# Patient Record
Sex: Female | Born: 1950
Health system: Southern US, Community
[De-identification: ages and names within clinical notes are randomized; demographics above are authoritative.]

## PROBLEM LIST (undated history)

## (undated) DIAGNOSIS — C50912 Malignant neoplasm of unspecified site of left female breast: Secondary | ICD-10-CM

## (undated) DIAGNOSIS — D649 Anemia, unspecified: Secondary | ICD-10-CM

## (undated) DIAGNOSIS — H9190 Unspecified hearing loss, unspecified ear: Secondary | ICD-10-CM

## (undated) DIAGNOSIS — N200 Calculus of kidney: Secondary | ICD-10-CM

## (undated) DIAGNOSIS — H409 Unspecified glaucoma: Secondary | ICD-10-CM

## (undated) DIAGNOSIS — J189 Pneumonia, unspecified organism: Secondary | ICD-10-CM

## (undated) DIAGNOSIS — M1990 Unspecified osteoarthritis, unspecified site: Secondary | ICD-10-CM

## (undated) DIAGNOSIS — K5792 Diverticulitis of intestine, part unspecified, without perforation or abscess without bleeding: Secondary | ICD-10-CM

## (undated) DIAGNOSIS — B029 Zoster without complications: Secondary | ICD-10-CM

## (undated) DIAGNOSIS — I890 Lymphedema, not elsewhere classified: Secondary | ICD-10-CM

## (undated) DIAGNOSIS — E785 Hyperlipidemia, unspecified: Secondary | ICD-10-CM

## (undated) DIAGNOSIS — C189 Malignant neoplasm of colon, unspecified: Secondary | ICD-10-CM

## (undated) DIAGNOSIS — G709 Myoneural disorder, unspecified: Secondary | ICD-10-CM

## (undated) DIAGNOSIS — R42 Dizziness and giddiness: Secondary | ICD-10-CM

## (undated) HISTORY — DX: Unspecified hearing loss, unspecified ear: H91.90

## (undated) HISTORY — PX: COLON RESECTION: SHX5231

## (undated) HISTORY — DX: Unspecified osteoarthritis, unspecified site: M19.90

## (undated) HISTORY — DX: Pneumonia, unspecified organism: J18.9

## (undated) HISTORY — DX: Calculus of kidney: N20.0

## (undated) HISTORY — DX: Anemia, unspecified: D64.9

## (undated) HISTORY — DX: Zoster without complications: B02.9

## (undated) HISTORY — DX: Unspecified glaucoma: H40.9

## (undated) HISTORY — DX: Hyperlipidemia, unspecified: E78.5

## (undated) HISTORY — PX: MASTECTOMY: SHX3

---
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
## 1971-01-21 HISTORY — PX: ABDOMINAL HYSTERECTOMY: SHX81

## 1971-01-21 HISTORY — PX: OTHER SURGICAL HISTORY: SHX169

## 2000-01-21 DIAGNOSIS — C50912 Malignant neoplasm of unspecified site of left female breast: Secondary | ICD-10-CM

## 2000-01-21 HISTORY — DX: Malignant neoplasm of unspecified site of left female breast: C50.912

## 2002-01-20 HISTORY — PX: COLONOSCOPY: SHX174

## 2003-12-19 ENCOUNTER — Ambulatory Visit: Payer: Self-pay | Admitting: Oncology

## 2004-01-11 ENCOUNTER — Ambulatory Visit: Payer: Self-pay | Admitting: Oncology

## 2004-01-21 ENCOUNTER — Ambulatory Visit: Payer: Self-pay | Admitting: Oncology

## 2004-03-22 ENCOUNTER — Ambulatory Visit: Payer: Self-pay | Admitting: Specialist

## 2004-04-29 ENCOUNTER — Ambulatory Visit: Payer: Self-pay | Admitting: General Surgery

## 2004-04-30 ENCOUNTER — Ambulatory Visit: Payer: Self-pay | Admitting: Oncology

## 2004-05-20 ENCOUNTER — Ambulatory Visit: Payer: Self-pay | Admitting: Oncology

## 2004-06-20 ENCOUNTER — Ambulatory Visit: Payer: Self-pay | Admitting: Oncology

## 2004-07-20 ENCOUNTER — Ambulatory Visit: Payer: Self-pay | Admitting: Oncology

## 2004-08-26 ENCOUNTER — Ambulatory Visit: Payer: Self-pay | Admitting: Oncology

## 2004-09-13 ENCOUNTER — Ambulatory Visit: Payer: Self-pay | Admitting: Oncology

## 2004-09-20 ENCOUNTER — Ambulatory Visit: Payer: Self-pay | Admitting: Oncology

## 2004-12-16 ENCOUNTER — Ambulatory Visit: Payer: Self-pay | Admitting: Oncology

## 2004-12-20 ENCOUNTER — Ambulatory Visit: Payer: Self-pay | Admitting: Oncology

## 2005-01-21 ENCOUNTER — Ambulatory Visit: Payer: Self-pay | Admitting: Oncology

## 2005-01-29 DIAGNOSIS — J309 Allergic rhinitis, unspecified: Secondary | ICD-10-CM | POA: Insufficient documentation

## 2005-02-20 ENCOUNTER — Ambulatory Visit: Payer: Self-pay | Admitting: Oncology

## 2005-04-14 ENCOUNTER — Ambulatory Visit: Payer: Self-pay | Admitting: Oncology

## 2005-04-20 ENCOUNTER — Ambulatory Visit: Payer: Self-pay | Admitting: Oncology

## 2005-05-27 ENCOUNTER — Ambulatory Visit: Payer: Self-pay | Admitting: Oncology

## 2005-06-16 ENCOUNTER — Ambulatory Visit: Payer: Self-pay | Admitting: General Surgery

## 2005-06-20 ENCOUNTER — Ambulatory Visit: Payer: Self-pay | Admitting: Oncology

## 2005-08-12 ENCOUNTER — Ambulatory Visit: Payer: Self-pay | Admitting: Oncology

## 2005-08-20 ENCOUNTER — Ambulatory Visit: Payer: Self-pay | Admitting: Oncology

## 2005-11-11 ENCOUNTER — Ambulatory Visit: Payer: Self-pay | Admitting: Oncology

## 2005-11-20 ENCOUNTER — Ambulatory Visit: Payer: Self-pay | Admitting: Oncology

## 2005-11-25 ENCOUNTER — Emergency Department: Payer: Self-pay | Admitting: Emergency Medicine

## 2006-02-09 ENCOUNTER — Ambulatory Visit: Payer: Self-pay | Admitting: Oncology

## 2006-02-20 ENCOUNTER — Ambulatory Visit: Payer: Self-pay | Admitting: Oncology

## 2006-03-02 ENCOUNTER — Encounter: Payer: Self-pay | Admitting: Unknown Physician Specialty

## 2006-03-21 ENCOUNTER — Encounter: Payer: Self-pay | Admitting: Unknown Physician Specialty

## 2006-04-21 ENCOUNTER — Encounter: Payer: Self-pay | Admitting: Unknown Physician Specialty

## 2006-04-27 ENCOUNTER — Emergency Department: Payer: Self-pay | Admitting: General Practice

## 2006-05-21 ENCOUNTER — Ambulatory Visit: Payer: Self-pay | Admitting: Oncology

## 2006-05-27 ENCOUNTER — Ambulatory Visit: Payer: Self-pay | Admitting: Oncology

## 2006-06-21 ENCOUNTER — Ambulatory Visit: Payer: Self-pay | Admitting: Oncology

## 2006-07-06 ENCOUNTER — Ambulatory Visit: Payer: Self-pay | Admitting: General Surgery

## 2006-09-21 ENCOUNTER — Ambulatory Visit: Payer: Self-pay | Admitting: Oncology

## 2006-10-05 ENCOUNTER — Ambulatory Visit: Payer: Self-pay | Admitting: Oncology

## 2006-10-21 ENCOUNTER — Ambulatory Visit: Payer: Self-pay | Admitting: Oncology

## 2006-12-09 ENCOUNTER — Ambulatory Visit: Payer: Self-pay | Admitting: Physician Assistant

## 2006-12-21 ENCOUNTER — Ambulatory Visit: Payer: Self-pay | Admitting: Oncology

## 2006-12-30 DIAGNOSIS — J209 Acute bronchitis, unspecified: Secondary | ICD-10-CM | POA: Insufficient documentation

## 2007-01-01 ENCOUNTER — Ambulatory Visit: Payer: Self-pay | Admitting: Family Medicine

## 2007-01-11 ENCOUNTER — Ambulatory Visit: Payer: Self-pay | Admitting: Oncology

## 2007-01-21 ENCOUNTER — Ambulatory Visit: Payer: Self-pay | Admitting: Oncology

## 2007-03-03 ENCOUNTER — Ambulatory Visit: Payer: Self-pay | Admitting: Family Medicine

## 2007-03-05 ENCOUNTER — Ambulatory Visit: Payer: Self-pay | Admitting: Oncology

## 2007-03-08 ENCOUNTER — Ambulatory Visit: Payer: Self-pay | Admitting: Oncology

## 2007-04-21 ENCOUNTER — Ambulatory Visit: Payer: Self-pay | Admitting: Oncology

## 2007-05-03 ENCOUNTER — Ambulatory Visit: Payer: Self-pay | Admitting: Oncology

## 2007-05-21 ENCOUNTER — Ambulatory Visit: Payer: Self-pay | Admitting: Oncology

## 2007-07-12 ENCOUNTER — Ambulatory Visit: Payer: Self-pay | Admitting: Gastroenterology

## 2007-07-14 ENCOUNTER — Ambulatory Visit: Payer: Self-pay | Admitting: Oncology

## 2007-07-19 ENCOUNTER — Ambulatory Visit: Payer: Self-pay | Admitting: Oncology

## 2007-07-21 ENCOUNTER — Ambulatory Visit: Payer: Self-pay | Admitting: Oncology

## 2007-10-21 ENCOUNTER — Ambulatory Visit: Payer: Self-pay | Admitting: Oncology

## 2007-11-21 ENCOUNTER — Ambulatory Visit: Payer: Self-pay | Admitting: Oncology

## 2007-11-25 ENCOUNTER — Ambulatory Visit: Payer: Self-pay | Admitting: Oncology

## 2007-12-21 ENCOUNTER — Ambulatory Visit: Payer: Self-pay | Admitting: Oncology

## 2008-02-22 ENCOUNTER — Ambulatory Visit: Payer: Self-pay | Admitting: General Surgery

## 2008-02-29 ENCOUNTER — Ambulatory Visit: Payer: Self-pay | Admitting: General Surgery

## 2008-05-20 ENCOUNTER — Ambulatory Visit: Payer: Self-pay | Admitting: Oncology

## 2008-05-25 ENCOUNTER — Ambulatory Visit: Payer: Self-pay | Admitting: Oncology

## 2008-06-20 ENCOUNTER — Ambulatory Visit: Payer: Self-pay | Admitting: Oncology

## 2008-08-16 ENCOUNTER — Ambulatory Visit: Payer: Self-pay | Admitting: Family Medicine

## 2009-05-21 ENCOUNTER — Ambulatory Visit: Payer: Self-pay | Admitting: Oncology

## 2009-05-30 ENCOUNTER — Ambulatory Visit: Payer: Self-pay | Admitting: Oncology

## 2009-06-20 ENCOUNTER — Ambulatory Visit: Payer: Self-pay | Admitting: Oncology

## 2009-08-22 ENCOUNTER — Ambulatory Visit: Payer: Self-pay | Admitting: Family Medicine

## 2009-08-28 ENCOUNTER — Ambulatory Visit: Payer: Self-pay | Admitting: Family Medicine

## 2010-04-13 ENCOUNTER — Emergency Department: Payer: Self-pay | Admitting: Emergency Medicine

## 2010-06-06 ENCOUNTER — Ambulatory Visit: Payer: Self-pay | Admitting: Oncology

## 2010-06-21 ENCOUNTER — Ambulatory Visit: Payer: Self-pay | Admitting: Oncology

## 2010-07-08 ENCOUNTER — Ambulatory Visit: Payer: Self-pay | Admitting: Gastroenterology

## 2010-08-30 ENCOUNTER — Ambulatory Visit: Payer: Self-pay | Admitting: Family Medicine

## 2011-02-04 ENCOUNTER — Ambulatory Visit: Payer: Self-pay | Admitting: Oncology

## 2011-02-04 LAB — CBC CANCER CENTER
Basophil %: 0.7 %
Eosinophil #: 0 x10 3/mm (ref 0.0–0.7)
HCT: 38.4 % (ref 35.0–47.0)
HGB: 13.2 g/dL (ref 12.0–16.0)
Lymphocyte #: 1.2 x10 3/mm (ref 1.0–3.6)
Lymphocyte %: 37.7 %
MCHC: 34.4 g/dL (ref 32.0–36.0)
Monocyte %: 6.2 %
Neutrophil #: 1.8 x10 3/mm (ref 1.4–6.5)
Neutrophil %: 54.2 %
RDW: 13.3 % (ref 11.5–14.5)
WBC: 3.3 x10 3/mm — ABNORMAL LOW (ref 3.6–11.0)

## 2011-02-04 LAB — COMPREHENSIVE METABOLIC PANEL
Alkaline Phosphatase: 104 U/L (ref 50–136)
Anion Gap: 5 — ABNORMAL LOW (ref 7–16)
Calcium, Total: 8.6 mg/dL (ref 8.5–10.1)
Chloride: 105 mmol/L (ref 98–107)
Co2: 32 mmol/L (ref 21–32)
EGFR (Non-African Amer.): >60
Osmolality: 281 (ref 275–301)
Potassium: 3.7 mmol/L (ref 3.5–5.1)
SGOT(AST): 23 U/L (ref 15–37)
Sodium: 142 mmol/L (ref 136–145)

## 2011-02-21 ENCOUNTER — Ambulatory Visit: Payer: Self-pay | Admitting: Oncology

## 2011-03-11 ENCOUNTER — Encounter: Payer: Self-pay | Admitting: Oncology

## 2011-03-21 ENCOUNTER — Ambulatory Visit: Payer: Self-pay | Admitting: Oncology

## 2011-03-21 ENCOUNTER — Encounter: Payer: Self-pay | Admitting: Oncology

## 2011-04-21 ENCOUNTER — Encounter: Payer: Self-pay | Admitting: Oncology

## 2011-05-21 ENCOUNTER — Encounter: Payer: Self-pay | Admitting: Oncology

## 2011-06-21 ENCOUNTER — Encounter: Payer: Self-pay | Admitting: Oncology

## 2011-11-07 ENCOUNTER — Ambulatory Visit: Payer: Self-pay | Admitting: Family Medicine

## 2012-03-20 ENCOUNTER — Ambulatory Visit: Payer: Self-pay | Admitting: Oncology

## 2012-03-31 ENCOUNTER — Encounter: Payer: Self-pay | Admitting: *Deleted

## 2012-03-31 LAB — COMPREHENSIVE METABOLIC PANEL
Albumin: 3 g/dL — ABNORMAL LOW (ref 3.4–5.0)
Alkaline Phosphatase: 101 U/L (ref 50–136)
Anion Gap: 8 (ref 7–16)
Calcium, Total: 8 mg/dL — ABNORMAL LOW (ref 8.5–10.1)
Chloride: 105 mmol/L (ref 98–107)
Co2: 29 mmol/L (ref 21–32)
Creatinine: 0.99 mg/dL (ref 0.60–1.30)
EGFR (African American): >60
EGFR (Non-African Amer.): >60
Glucose: 119 mg/dL — ABNORMAL HIGH (ref 65–99)
Sodium: 142 mmol/L (ref 136–145)

## 2012-03-31 LAB — CBC CANCER CENTER
Basophil #: 0 x10 3/mm (ref 0.0–0.1)
Basophil %: 0.7 %
Eosinophil %: 1.6 %
Lymphocyte #: 1 x10 3/mm (ref 1.0–3.6)
MCH: 27.9 pg (ref 26.0–34.0)
MCHC: 32 g/dL (ref 32.0–36.0)
Monocyte %: 7.6 %
Neutrophil #: 1.7 x10 3/mm (ref 1.4–6.5)
Platelet: 204 x10 3/mm (ref 150–440)
RBC: 3.35 10*6/uL — ABNORMAL LOW (ref 3.80–5.20)

## 2012-04-01 LAB — CANCER ANTIGEN 27.29: CA 27.29: 35.3 U/mL (ref 0.0–38.6)

## 2012-04-06 ENCOUNTER — Ambulatory Visit (INDEPENDENT_AMBULATORY_CARE_PROVIDER_SITE_OTHER): Payer: BC Managed Care – PPO | Admitting: General Surgery

## 2012-04-06 ENCOUNTER — Encounter: Payer: Self-pay | Admitting: General Surgery

## 2012-04-06 VITALS — BP 106/58 | HR 78 | Resp 12 | Ht 62.0 in | Wt 141.0 lb

## 2012-04-06 DIAGNOSIS — B029 Zoster without complications: Secondary | ICD-10-CM | POA: Insufficient documentation

## 2012-04-06 DIAGNOSIS — R109 Unspecified abdominal pain: Secondary | ICD-10-CM

## 2012-04-06 DIAGNOSIS — K4090 Unilateral inguinal hernia, without obstruction or gangrene, not specified as recurrent: Secondary | ICD-10-CM

## 2012-04-06 NOTE — Progress Notes (Signed)
Subjective:     Patient ID: Amy Bird, female   DOB: 1951-01-15, 62 y.o.   MRN: [id number]  HPI Patient presents with a 1 year history of abdominal pain. Patient states it's located in the right groin that comes and goes and ranges in severity. She states that certain things she drinks tends to flare up the pain. She has had a previous problem with kidney stones in which the pain is similar and is located in the same area.  The patient reports a one year history of discomfort in the right groin above the inguinal ligament.this is worse after she drinks coffee or orange juice, and has been associated with discomfort prior to voiding. She reports that she's had several urinary tract infections treated with antibiotics, but her last urine sample showed only inflammation. She reports no difficulty with bowel function. By report, a colonoscopy was completed last year.  She is not experiencing any postprandial pain or weight loss.  The patient underwent hysterectomy, right oophorectomy and appendectomy in the 1970s.   Review of Systems  Constitutional: Negative.   Cardiovascular: Negative.   Gastrointestinal: Negative.   Genitourinary: Negative.   Musculoskeletal: Negative.        Objective:   Physical Exam  Constitutional: She appears well-developed and well-nourished.  Neck: Normal range of motion. Neck supple.  Cardiovascular: Normal rate, regular rhythm and normal heart sounds.   Pulmonary/Chest: Effort normal and breath sounds normal.  Abdominal: Soft. Bowel sounds are normal. There is tenderness. A hernia is present. Hernia confirmed positive in the right inguinal area.  There is a slight bulge at the right internal inguinal ring with associated tenderness. The left side is intact.     Assessment:     Abdominal pain, possibly related to a right inguinal hernia.  Pelvic ultrasound completed in October 2013 showed no discernible abnormalities.     Plan:     The patient's primary  care physician will be contacted. The patient may benefit from a CT to assess for secondary sources of her pain prior to surgical intervention. Her symptoms are modest (4/10) and with a very slight bulge noted on clinical exam she may elect to defer elective intervention.

## 2012-04-07 ENCOUNTER — Telehealth: Payer: Self-pay | Admitting: *Deleted

## 2012-04-07 NOTE — Telephone Encounter (Signed)
Patient has been scheduled for a CT abdomen/pelvis at [facility] for 04-14-12 @ 8 am (arrive 7:45 am). Prep: No solids 4 hours prior but patient may have clear liquids up until exam time, pick up prep kit, and take medication list.  Patient verbalizes understanding. This patient will follow up in the office on 04-22-12 @ 4:45 pm.

## 2012-04-07 NOTE — Telephone Encounter (Signed)
Message copied by Nicholes Mango on Wed Apr 07, 2012  9:25 AM ------      Message from: [place], [place] W      Created: Wed Apr 07, 2012  7:27 AM      Regarding: Imaging       Please arrange for a CT of the abdomen and pelvis re: RLQ/ inguinal pain with recurrent UTI. Appt to follow.  ------

## 2012-04-14 ENCOUNTER — Encounter: Payer: Self-pay | Admitting: General Surgery

## 2012-04-14 ENCOUNTER — Ambulatory Visit: Payer: Self-pay | Admitting: General Surgery

## 2012-04-14 NOTE — Progress Notes (Signed)
CT of the abdomen and pelvis completed April 12, 2012 has been reviewed. Abdominal wall hernias not confirmed. A calcified density in the right pelvis it were changed from 2011 is appreciated. No obvious abnormality of the bowel or bladder appreciated.  The patient will be notified of the CT results. If her abdominal symptoms warrant intervention, a laparoscopic exam to confirm the clinical impression of a right inguinal hernia would be more appropriate than groin exploration alone based on the results of the recent CT study.

## 2012-04-15 ENCOUNTER — Telehealth: Payer: Self-pay | Admitting: *Deleted

## 2012-04-15 NOTE — Telephone Encounter (Signed)
Message copied by Currie Paris on Thu Apr 15, 2012  8:33 AM ------      Message from: [place], [place] W      Created: Wed Apr 14, 2012  9:46 PM       Please notify the patient the recently completed CT looked good.  If she desires to further discuss hernia surgery, a f/u appt is appropriate. If she wants to wait and see how things go, a planned f/u in six months would be reasonable. Thanks. ------

## 2012-04-15 NOTE — Telephone Encounter (Signed)
Notified pt as instructed. Pt desires to wait for 6 months for F/U.  She is aware to call if further issues or concerns that may arise.

## 2012-04-20 ENCOUNTER — Ambulatory Visit: Payer: Self-pay | Admitting: Oncology

## 2012-04-22 ENCOUNTER — Ambulatory Visit: Payer: BC Managed Care – PPO | Admitting: General Surgery

## 2012-10-20 DIAGNOSIS — C189 Malignant neoplasm of colon, unspecified: Secondary | ICD-10-CM

## 2012-10-20 HISTORY — DX: Malignant neoplasm of colon, unspecified: C18.9

## 2012-11-08 ENCOUNTER — Other Ambulatory Visit: Payer: Self-pay | Admitting: Family Medicine

## 2012-11-08 LAB — CBC WITH DIFFERENTIAL/PLATELET
Basophil %: 1.2 %
Eosinophil %: 2.2 %
HGB: 7 g/dL — ABNORMAL LOW (ref 12.0–16.0)
MCH: 19.8 pg — ABNORMAL LOW (ref 26.0–34.0)
MCV: 66 fL — ABNORMAL LOW (ref 80–100)
Monocyte #: 0.3 x10 3/mm (ref 0.2–0.9)
Neutrophil #: 0.9 10*3/uL — ABNORMAL LOW (ref 1.4–6.5)
Platelet: 234 10*3/uL (ref 150–440)
WBC: 2 10*3/uL — CL (ref 3.6–11.0)

## 2012-11-09 ENCOUNTER — Ambulatory Visit: Payer: Self-pay | Admitting: Oncology

## 2012-11-09 LAB — CBC CANCER CENTER
Basophil #: 0 x10 3/mm (ref 0.0–0.1)
Basophil %: 1.5 %
Eosinophil #: 0.1 x10 3/mm (ref 0.0–0.7)
HCT: 23 % — ABNORMAL LOW (ref 35.0–47.0)
HGB: 6.8 g/dL — ABNORMAL LOW (ref 12.0–16.0)
Lymphocyte #: 1.1 x10 3/mm (ref 1.0–3.6)
MCH: 20 pg — ABNORMAL LOW (ref 26.0–34.0)
MCHC: 29.7 g/dL — ABNORMAL LOW (ref 32.0–36.0)
MCV: 68 fL — ABNORMAL LOW (ref 80–100)
Monocyte #: 0.2 x10 3/mm (ref 0.2–0.9)
Neutrophil #: 1.1 x10 3/mm — ABNORMAL LOW (ref 1.4–6.5)
RBC: 3.4 10*6/uL — ABNORMAL LOW (ref 3.80–5.20)

## 2012-11-09 LAB — COMPREHENSIVE METABOLIC PANEL
Albumin: 3.3 g/dL — ABNORMAL LOW (ref 3.4–5.0)
Anion Gap: 5 — ABNORMAL LOW (ref 7–16)
Creatinine: 0.82 mg/dL (ref 0.60–1.30)
Glucose: 119 mg/dL — ABNORMAL HIGH (ref 65–99)
Potassium: 3.3 mmol/L — ABNORMAL LOW (ref 3.5–5.1)
SGOT(AST): 19 U/L (ref 15–37)

## 2012-11-09 LAB — FERRITIN: Ferritin (ARMC): 4 ng/mL — ABNORMAL LOW (ref 8–388)

## 2012-11-09 LAB — IRON AND TIBC
Iron Bind.Cap.(Total): 530 ug/dL — ABNORMAL HIGH (ref 250–450)
Iron Saturation: 4 %

## 2012-11-10 LAB — CANCER ANTIGEN 27.29: CA 27.29: 38.1 U/mL (ref 0.0–38.6)

## 2012-11-16 LAB — CBC CANCER CENTER
Basophil: 2 %
HCT: 32 % — ABNORMAL LOW (ref 35.0–47.0)
Lymphocytes: 33 %
MCHC: 30.7 g/dL — ABNORMAL LOW (ref 32.0–36.0)
Platelet: 217 x10 3/mm (ref 150–440)
RBC: 4.39 10*6/uL (ref 3.80–5.20)
RDW: 22.4 % — ABNORMAL HIGH (ref 11.5–14.5)
Segmented Neutrophils: 53 %
WBC: 3.7 x10 3/mm (ref 3.6–11.0)

## 2012-11-16 LAB — OCCULT BLOOD X 1 CARD TO LAB, STOOL
Occult Blood, Feces: NEGATIVE
Occult Blood, Feces: NEGATIVE

## 2012-11-20 ENCOUNTER — Ambulatory Visit: Payer: Self-pay | Admitting: Oncology

## 2012-11-22 ENCOUNTER — Ambulatory Visit: Payer: BC Managed Care – PPO | Admitting: General Surgery

## 2012-12-02 ENCOUNTER — Ambulatory Visit: Payer: Self-pay | Admitting: Gastroenterology

## 2012-12-09 ENCOUNTER — Ambulatory Visit: Payer: Self-pay | Admitting: Surgery

## 2012-12-09 LAB — CBC
HCT: 33.4 % — ABNORMAL LOW (ref 35.0–47.0)
MCH: 24.5 pg — ABNORMAL LOW (ref 26.0–34.0)
MCHC: 31.3 g/dL — ABNORMAL LOW (ref 32.0–36.0)
Platelet: 119 10*3/uL — ABNORMAL LOW (ref 150–440)
RBC: 4.26 10*6/uL (ref 3.80–5.20)
RDW: 31.7 % — ABNORMAL HIGH (ref 11.5–14.5)
WBC: 2.7 10*3/uL — ABNORMAL LOW (ref 3.6–11.0)

## 2012-12-10 LAB — CEA: CEA: 3.8 ng/mL (ref 0.0–4.7)

## 2012-12-14 ENCOUNTER — Encounter: Payer: Self-pay | Admitting: *Deleted

## 2012-12-20 ENCOUNTER — Ambulatory Visit: Payer: Self-pay | Admitting: Oncology

## 2012-12-21 ENCOUNTER — Ambulatory Visit: Payer: Self-pay | Admitting: Surgery

## 2012-12-21 LAB — CBC WITH DIFFERENTIAL/PLATELET
Basophil %: 0.5 %
Eosinophil %: 0.7 %
HCT: 31.8 % — ABNORMAL LOW (ref 35.0–47.0)
Lymphocyte #: 1.1 10*3/uL (ref 1.0–3.6)
Lymphocyte %: 31.6 %
MCH: 26.7 pg (ref 26.0–34.0)
MCHC: 32.4 g/dL (ref 32.0–36.0)
MCV: 82 fL (ref 80–100)
Monocyte %: 4.5 %
RDW: 32.4 % — ABNORMAL HIGH (ref 11.5–14.5)
WBC: 3.5 10*3/uL — ABNORMAL LOW (ref 3.6–11.0)

## 2012-12-23 ENCOUNTER — Inpatient Hospital Stay: Payer: Self-pay | Admitting: Surgery

## 2012-12-24 LAB — BASIC METABOLIC PANEL
Chloride: 101 mmol/L (ref 98–107)
Co2: 27 mmol/L (ref 21–32)
Creatinine: 0.75 mg/dL (ref 0.60–1.30)
EGFR (African American): >60
EGFR (Non-African Amer.): >60
Glucose: 150 mg/dL — ABNORMAL HIGH (ref 65–99)
Osmolality: 268 (ref 275–301)
Sodium: 134 mmol/L — ABNORMAL LOW (ref 136–145)

## 2012-12-24 LAB — CBC WITH DIFFERENTIAL/PLATELET
Basophil #: 0 10*3/uL (ref 0.0–0.1)
Basophil %: 0.3 %
Eosinophil #: 0 10*3/uL (ref 0.0–0.7)
Eosinophil %: 0.1 %
HCT: 38.7 % (ref 35.0–47.0)
HGB: 12.7 g/dL (ref 12.0–16.0)
Lymphocyte #: 0.8 10*3/uL — ABNORMAL LOW (ref 1.0–3.6)
Lymphocyte %: 10.8 %
MCH: 27 pg (ref 26.0–34.0)
MCV: 83 fL (ref 80–100)
Monocyte #: 0.3 x10 3/mm (ref 0.2–0.9)
Neutrophil #: 6.5 10*3/uL (ref 1.4–6.5)
Neutrophil %: 85.1 %
RDW: 32.6 % — ABNORMAL HIGH (ref 11.5–14.5)
WBC: 7.6 10*3/uL (ref 3.6–11.0)

## 2012-12-27 LAB — PATHOLOGY REPORT

## 2013-01-06 LAB — CBC CANCER CENTER
Basophil #: 0 x10 3/mm (ref 0.0–0.1)
Basophil %: 0.9 %
Eosinophil #: 0 x10 3/mm (ref 0.0–0.7)
Eosinophil %: 1.3 %
HGB: 11.4 g/dL — ABNORMAL LOW (ref 12.0–16.0)
Lymphocyte #: 1 x10 3/mm (ref 1.0–3.6)
Lymphocyte %: 29.2 %
MCHC: 31.9 g/dL — ABNORMAL LOW (ref 32.0–36.0)
Monocyte %: 6.3 %
Neutrophil %: 62.3 %
Platelet: 237 x10 3/mm (ref 150–440)
RBC: 4.12 10*6/uL (ref 3.80–5.20)
WBC: 3.4 x10 3/mm — ABNORMAL LOW (ref 3.6–11.0)

## 2013-01-06 LAB — COMPREHENSIVE METABOLIC PANEL
Alkaline Phosphatase: 82 U/L
Anion Gap: 6 — ABNORMAL LOW (ref 7–16)
Bilirubin,Total: 0.2 mg/dL (ref 0.2–1.0)
Co2: 32 mmol/L (ref 21–32)
Creatinine: 0.86 mg/dL (ref 0.60–1.30)
EGFR (Non-African Amer.): >60
Glucose: 98 mg/dL (ref 65–99)
Osmolality: 280 (ref 275–301)
Potassium: 3.5 mmol/L (ref 3.5–5.1)
Sodium: 141 mmol/L (ref 136–145)
Total Protein: 7.4 g/dL (ref 6.4–8.2)

## 2013-01-14 LAB — URINALYSIS, COMPLETE
Bacteria: NONE SEEN
Blood: NEGATIVE
Glucose,UR: NEGATIVE mg/dL (ref 0–75)
Ph: 5 (ref 4.5–8.0)
Protein: NEGATIVE
RBC,UR: <1 /HPF (ref 0–5)
Specific Gravity: 1.018 (ref 1.003–1.030)
Squamous Epithelial: NONE SEEN

## 2013-01-15 LAB — URINE CULTURE

## 2013-01-18 ENCOUNTER — Ambulatory Visit: Payer: Self-pay | Admitting: Surgery

## 2013-01-20 ENCOUNTER — Ambulatory Visit: Payer: Self-pay | Admitting: Oncology

## 2013-01-27 LAB — COMPREHENSIVE METABOLIC PANEL
ALK PHOS: 81 U/L
ALT: 20 U/L (ref 12–78)
AST: 17 U/L (ref 15–37)
Albumin: 3.4 g/dL (ref 3.4–5.0)
Anion Gap: 7 (ref 7–16)
BUN: 12 mg/dL (ref 7–18)
Bilirubin,Total: 0.2 mg/dL (ref 0.2–1.0)
CALCIUM: 8.7 mg/dL (ref 8.5–10.1)
CHLORIDE: 105 mmol/L (ref 98–107)
CREATININE: 0.87 mg/dL (ref 0.60–1.30)
Co2: 30 mmol/L (ref 21–32)
EGFR (African American): >60
EGFR (Non-African Amer.): >60
Glucose: 90 mg/dL (ref 65–99)
Osmolality: 282 (ref 275–301)
Potassium: 3.4 mmol/L — ABNORMAL LOW (ref 3.5–5.1)
Sodium: 142 mmol/L (ref 136–145)
TOTAL PROTEIN: 7.6 g/dL (ref 6.4–8.2)

## 2013-01-27 LAB — CBC CANCER CENTER
BASOS ABS: 0 x10 3/mm (ref 0.0–0.1)
BASOS PCT: 1 %
Eosinophil #: 0 x10 3/mm (ref 0.0–0.7)
Eosinophil %: 1.8 %
HCT: 37.4 % (ref 35.0–47.0)
HGB: 12.1 g/dL (ref 12.0–16.0)
LYMPHS ABS: 1 x10 3/mm (ref 1.0–3.6)
LYMPHS PCT: 38.1 %
MCH: 28.9 pg (ref 26.0–34.0)
MCHC: 32.4 g/dL (ref 32.0–36.0)
MCV: 89 fL (ref 80–100)
MONOS PCT: 6.8 %
Monocyte #: 0.2 x10 3/mm (ref 0.2–0.9)
Neutrophil #: 1.4 x10 3/mm (ref 1.4–6.5)
Neutrophil %: 52.3 %
PLATELETS: 161 x10 3/mm (ref 150–440)
RBC: 4.19 10*6/uL (ref 3.80–5.20)
RDW: 27 % — ABNORMAL HIGH (ref 11.5–14.5)
WBC: 2.7 x10 3/mm — ABNORMAL LOW (ref 3.6–11.0)

## 2013-01-27 LAB — PROTIME-INR
INR: 0.9
Prothrombin Time: 12.3 secs (ref 11.5–14.7)

## 2013-01-28 LAB — CBC CANCER CENTER
BASOS PCT: 0.8 %
Basophil #: 0 x10 3/mm (ref 0.0–0.1)
EOS ABS: 0 x10 3/mm (ref 0.0–0.7)
Eosinophil %: 1.5 %
HCT: 34 % — AB (ref 35.0–47.0)
HGB: 11.2 g/dL — ABNORMAL LOW (ref 12.0–16.0)
LYMPHS ABS: 1 x10 3/mm (ref 1.0–3.6)
LYMPHS PCT: 36.5 %
MCH: 29.5 pg (ref 26.0–34.0)
MCHC: 33 g/dL (ref 32.0–36.0)
MCV: 89 fL (ref 80–100)
MONO ABS: 0.2 x10 3/mm (ref 0.2–0.9)
Monocyte %: 7.2 %
Neutrophil #: 1.5 x10 3/mm (ref 1.4–6.5)
Neutrophil %: 54 %
Platelet: 162 x10 3/mm (ref 150–440)
RBC: 3.81 10*6/uL (ref 3.80–5.20)
RDW: 26.2 % — AB (ref 11.5–14.5)
WBC: 2.8 x10 3/mm — AB (ref 3.6–11.0)

## 2013-01-28 LAB — CEA: CEA: 6.3 ng/mL — ABNORMAL HIGH (ref 0.0–4.7)

## 2013-02-11 LAB — COMPREHENSIVE METABOLIC PANEL
ALBUMIN: 3 g/dL — AB (ref 3.4–5.0)
ALT: 24 U/L (ref 12–78)
Alkaline Phosphatase: 106 U/L
Anion Gap: 8 (ref 7–16)
BILIRUBIN TOTAL: 0.2 mg/dL (ref 0.2–1.0)
BUN: 12 mg/dL (ref 7–18)
CO2: 29 mmol/L (ref 21–32)
Calcium, Total: 8.6 mg/dL (ref 8.5–10.1)
Chloride: 104 mmol/L (ref 98–107)
Creatinine: 0.88 mg/dL (ref 0.60–1.30)
Glucose: 145 mg/dL — ABNORMAL HIGH (ref 65–99)
OSMOLALITY: 284 (ref 275–301)
Potassium: 3.7 mmol/L (ref 3.5–5.1)
SGOT(AST): 25 U/L (ref 15–37)
Sodium: 141 mmol/L (ref 136–145)
Total Protein: 7 g/dL (ref 6.4–8.2)

## 2013-02-11 LAB — CBC CANCER CENTER
Basophil #: 0 x10 3/mm (ref 0.0–0.1)
Basophil %: 0.9 %
Eosinophil #: 0 x10 3/mm (ref 0.0–0.7)
Eosinophil %: 1.2 %
HCT: 32.7 % — ABNORMAL LOW (ref 35.0–47.0)
HGB: 10.7 g/dL — AB (ref 12.0–16.0)
LYMPHS ABS: 1.2 x10 3/mm (ref 1.0–3.6)
LYMPHS PCT: 34 %
MCH: 30 pg (ref 26.0–34.0)
MCHC: 32.8 g/dL (ref 32.0–36.0)
MCV: 92 fL (ref 80–100)
Monocyte #: 0.3 x10 3/mm (ref 0.2–0.9)
Monocyte %: 9.7 %
Neutrophil #: 1.9 x10 3/mm (ref 1.4–6.5)
Neutrophil %: 54.2 %
PLATELETS: 98 x10 3/mm — AB (ref 150–440)
RBC: 3.57 10*6/uL — ABNORMAL LOW (ref 3.80–5.20)
RDW: 23 % — AB (ref 11.5–14.5)
WBC: 3.4 x10 3/mm — ABNORMAL LOW (ref 3.6–11.0)

## 2013-02-20 ENCOUNTER — Ambulatory Visit: Payer: Self-pay | Admitting: Oncology

## 2013-02-25 LAB — CBC CANCER CENTER
Basophil #: 0.1 x10 3/mm (ref 0.0–0.1)
Basophil %: 0.7 %
Eosinophil #: 0 x10 3/mm (ref 0.0–0.7)
Eosinophil %: 0.2 %
HCT: 33.7 % — ABNORMAL LOW (ref 35.0–47.0)
HGB: 11.2 g/dL — ABNORMAL LOW (ref 12.0–16.0)
LYMPHS PCT: 24.3 %
Lymphocyte #: 2 x10 3/mm (ref 1.0–3.6)
MCH: 30.9 pg (ref 26.0–34.0)
MCHC: 33.3 g/dL (ref 32.0–36.0)
MCV: 93 fL (ref 80–100)
MONO ABS: 0.6 x10 3/mm (ref 0.2–0.9)
MONOS PCT: 7.3 %
NEUTROS PCT: 67.5 %
Neutrophil #: 5.6 x10 3/mm (ref 1.4–6.5)
Platelet: 86 x10 3/mm — ABNORMAL LOW (ref 150–440)
RBC: 3.64 10*6/uL — ABNORMAL LOW (ref 3.80–5.20)
RDW: 19.3 % — ABNORMAL HIGH (ref 11.5–14.5)
WBC: 8.3 x10 3/mm (ref 3.6–11.0)

## 2013-02-25 LAB — COMPREHENSIVE METABOLIC PANEL
ANION GAP: 10 (ref 7–16)
Albumin: 3.3 g/dL — ABNORMAL LOW (ref 3.4–5.0)
Alkaline Phosphatase: 140 U/L — ABNORMAL HIGH
BILIRUBIN TOTAL: 0.2 mg/dL (ref 0.2–1.0)
BUN: 8 mg/dL (ref 7–18)
CALCIUM: 9 mg/dL (ref 8.5–10.1)
CO2: 28 mmol/L (ref 21–32)
Chloride: 104 mmol/L (ref 98–107)
Creatinine: 0.81 mg/dL (ref 0.60–1.30)
EGFR (African American): >60
GLUCOSE: 122 mg/dL — AB (ref 65–99)
Osmolality: 283 (ref 275–301)
Potassium: 3.6 mmol/L (ref 3.5–5.1)
SGOT(AST): 29 U/L (ref 15–37)
SGPT (ALT): 31 U/L (ref 12–78)
Sodium: 142 mmol/L (ref 136–145)
TOTAL PROTEIN: 7.2 g/dL (ref 6.4–8.2)

## 2013-03-04 LAB — CBC CANCER CENTER
BASOS ABS: 0 x10 3/mm (ref 0.0–0.1)
Basophil %: 1 %
EOS ABS: 0 x10 3/mm (ref 0.0–0.7)
Eosinophil %: 0.2 %
HCT: 34 % — ABNORMAL LOW (ref 35.0–47.0)
HGB: 11.1 g/dL — ABNORMAL LOW (ref 12.0–16.0)
LYMPHS ABS: 0.9 x10 3/mm — AB (ref 1.0–3.6)
Lymphocyte %: 36.7 %
MCH: 30.4 pg (ref 26.0–34.0)
MCHC: 32.7 g/dL (ref 32.0–36.0)
MCV: 93 fL (ref 80–100)
Monocyte #: 0.1 x10 3/mm — ABNORMAL LOW (ref 0.2–0.9)
Monocyte %: 3 %
NEUTROS PCT: 59.1 %
Neutrophil #: 1.5 x10 3/mm (ref 1.4–6.5)
Platelet: 84 x10 3/mm — ABNORMAL LOW (ref 150–440)
RBC: 3.66 10*6/uL — ABNORMAL LOW (ref 3.80–5.20)
RDW: 15.2 % — AB (ref 11.5–14.5)
WBC: 2.5 x10 3/mm — ABNORMAL LOW (ref 3.6–11.0)

## 2013-03-11 LAB — CBC CANCER CENTER
Basophil #: 0 x10 3/mm (ref 0.0–0.1)
Basophil %: 0.5 %
EOS ABS: 0 x10 3/mm (ref 0.0–0.7)
EOS PCT: 0.2 %
HCT: 31.5 % — ABNORMAL LOW (ref 35.0–47.0)
HGB: 10.5 g/dL — ABNORMAL LOW (ref 12.0–16.0)
LYMPHS ABS: 0.8 x10 3/mm — AB (ref 1.0–3.6)
Lymphocyte %: 37.2 %
MCH: 31.3 pg (ref 26.0–34.0)
MCHC: 33.3 g/dL (ref 32.0–36.0)
MCV: 94 fL (ref 80–100)
MONO ABS: 0.4 x10 3/mm (ref 0.2–0.9)
Monocyte %: 17 %
Neutrophil #: 0.9 x10 3/mm — ABNORMAL LOW (ref 1.4–6.5)
Neutrophil %: 45.1 %
PLATELETS: 73 x10 3/mm — AB (ref 150–440)
RBC: 3.34 10*6/uL — AB (ref 3.80–5.20)
RDW: 15.4 % — ABNORMAL HIGH (ref 11.5–14.5)
WBC: 2.1 x10 3/mm — ABNORMAL LOW (ref 3.6–11.0)

## 2013-03-11 LAB — BASIC METABOLIC PANEL
Anion Gap: 10 (ref 7–16)
BUN: 10 mg/dL (ref 7–18)
CO2: 27 mmol/L (ref 21–32)
Calcium, Total: 8.2 mg/dL — ABNORMAL LOW (ref 8.5–10.1)
Chloride: 103 mmol/L (ref 98–107)
Creatinine: 0.81 mg/dL (ref 0.60–1.30)
EGFR (African American): >60
EGFR (Non-African Amer.): >60
Glucose: 129 mg/dL — ABNORMAL HIGH (ref 65–99)
OSMOLALITY: 280 (ref 275–301)
Potassium: 3.7 mmol/L (ref 3.5–5.1)
Sodium: 140 mmol/L (ref 136–145)

## 2013-03-18 LAB — CBC CANCER CENTER
Basophil #: 0 x10 3/mm (ref 0.0–0.1)
Basophil %: 1.3 %
EOS PCT: 0.5 %
Eosinophil #: 0 x10 3/mm (ref 0.0–0.7)
HCT: 33.2 % — ABNORMAL LOW (ref 35.0–47.0)
HGB: 10.9 g/dL — ABNORMAL LOW (ref 12.0–16.0)
LYMPHS PCT: 54.5 %
Lymphocyte #: 1.1 x10 3/mm (ref 1.0–3.6)
MCH: 31.4 pg (ref 26.0–34.0)
MCHC: 32.9 g/dL (ref 32.0–36.0)
MCV: 96 fL (ref 80–100)
MONO ABS: 0.4 x10 3/mm (ref 0.2–0.9)
Monocyte %: 19.9 %
Neutrophil #: 0.5 x10 3/mm — ABNORMAL LOW (ref 1.4–6.5)
Neutrophil %: 23.8 %
Platelet: 174 x10 3/mm (ref 150–440)
RBC: 3.47 10*6/uL — AB (ref 3.80–5.20)
RDW: 15.6 % — AB (ref 11.5–14.5)
WBC: 2.1 x10 3/mm — ABNORMAL LOW (ref 3.6–11.0)

## 2013-03-18 LAB — COMPREHENSIVE METABOLIC PANEL
ALBUMIN: 3.1 g/dL — AB (ref 3.4–5.0)
ANION GAP: 8 (ref 7–16)
Alkaline Phosphatase: 111 U/L
BILIRUBIN TOTAL: 0.2 mg/dL (ref 0.2–1.0)
BUN: 12 mg/dL (ref 7–18)
CO2: 29 mmol/L (ref 21–32)
Calcium, Total: 8.5 mg/dL (ref 8.5–10.1)
Chloride: 106 mmol/L (ref 98–107)
Creatinine: 0.8 mg/dL (ref 0.60–1.30)
EGFR (African American): >60
EGFR (Non-African Amer.): >60
Glucose: 87 mg/dL (ref 65–99)
OSMOLALITY: 284 (ref 275–301)
POTASSIUM: 4.2 mmol/L (ref 3.5–5.1)
SGOT(AST): 75 U/L — ABNORMAL HIGH (ref 15–37)
SGPT (ALT): 111 U/L — ABNORMAL HIGH (ref 12–78)
SODIUM: 143 mmol/L (ref 136–145)
TOTAL PROTEIN: 7.5 g/dL (ref 6.4–8.2)

## 2013-03-20 ENCOUNTER — Ambulatory Visit: Payer: Self-pay | Admitting: Oncology

## 2013-03-25 LAB — CBC CANCER CENTER
BASOS ABS: 0 x10 3/mm (ref 0.0–0.1)
Basophil %: 0.5 %
EOS ABS: 0 x10 3/mm (ref 0.0–0.7)
Eosinophil %: 0.4 %
HCT: 35.3 % (ref 35.0–47.0)
HGB: 11.4 g/dL — ABNORMAL LOW (ref 12.0–16.0)
Lymphocyte #: 1.8 x10 3/mm (ref 1.0–3.6)
Lymphocyte %: 24.6 %
MCH: 30.8 pg (ref 26.0–34.0)
MCHC: 32.4 g/dL (ref 32.0–36.0)
MCV: 95 fL (ref 80–100)
MONO ABS: 0.9 x10 3/mm (ref 0.2–0.9)
MONOS PCT: 12.8 %
Neutrophil #: 4.4 x10 3/mm (ref 1.4–6.5)
Neutrophil %: 61.7 %
Platelet: 123 x10 3/mm — ABNORMAL LOW (ref 150–440)
RBC: 3.71 10*6/uL — ABNORMAL LOW (ref 3.80–5.20)
RDW: 15.3 % — ABNORMAL HIGH (ref 11.5–14.5)
WBC: 7.2 x10 3/mm (ref 3.6–11.0)

## 2013-04-01 LAB — CBC CANCER CENTER
BASOS PCT: 0.3 %
Basophil #: 0 x10 3/mm (ref 0.0–0.1)
EOS ABS: 0 x10 3/mm (ref 0.0–0.7)
EOS PCT: 0.2 %
HCT: 33.8 % — ABNORMAL LOW (ref 35.0–47.0)
HGB: 10.8 g/dL — ABNORMAL LOW (ref 12.0–16.0)
Lymphocyte #: 1.3 x10 3/mm (ref 1.0–3.6)
Lymphocyte %: 14.8 %
MCH: 30.7 pg (ref 26.0–34.0)
MCHC: 32 g/dL (ref 32.0–36.0)
MCV: 96 fL (ref 80–100)
MONOS PCT: 5.6 %
Monocyte #: 0.5 x10 3/mm (ref 0.2–0.9)
NEUTROS ABS: 6.9 x10 3/mm — AB (ref 1.4–6.5)
Neutrophil %: 79.1 %
Platelet: 49 x10 3/mm — ABNORMAL LOW (ref 150–440)
RBC: 3.53 10*6/uL — AB (ref 3.80–5.20)
RDW: 14.9 % — ABNORMAL HIGH (ref 11.5–14.5)
WBC: 8.7 x10 3/mm (ref 3.6–11.0)

## 2013-04-08 LAB — CBC CANCER CENTER
BASOS ABS: 0 x10 3/mm (ref 0.0–0.1)
Basophil %: 0.4 %
EOS ABS: 0.1 x10 3/mm (ref 0.0–0.7)
EOS PCT: 1.3 %
HCT: 33 % — ABNORMAL LOW (ref 35.0–47.0)
HGB: 10.9 g/dL — AB (ref 12.0–16.0)
LYMPHS PCT: 29.6 %
Lymphocyte #: 1.3 x10 3/mm (ref 1.0–3.6)
MCH: 31.9 pg (ref 26.0–34.0)
MCHC: 33.1 g/dL (ref 32.0–36.0)
MCV: 96 fL (ref 80–100)
MONOS PCT: 11.6 %
Monocyte #: 0.5 x10 3/mm (ref 0.2–0.9)
Neutrophil #: 2.6 x10 3/mm (ref 1.4–6.5)
Neutrophil %: 57.1 %
Platelet: 128 x10 3/mm — ABNORMAL LOW (ref 150–440)
RBC: 3.43 10*6/uL — ABNORMAL LOW (ref 3.80–5.20)
RDW: 15.9 % — ABNORMAL HIGH (ref 11.5–14.5)
WBC: 4.6 x10 3/mm (ref 3.6–11.0)

## 2013-04-08 LAB — HEPATIC FUNCTION PANEL A (ARMC)
ALK PHOS: 152 U/L — AB
ALT: 26 U/L (ref 12–78)
AST: 28 U/L (ref 15–37)
Albumin: 3.2 g/dL — ABNORMAL LOW (ref 3.4–5.0)
BILIRUBIN TOTAL: 0.2 mg/dL (ref 0.2–1.0)
Bilirubin, Direct: 0.1 mg/dL (ref 0.00–0.20)
TOTAL PROTEIN: 7.4 g/dL (ref 6.4–8.2)

## 2013-04-08 LAB — CREATININE, SERUM
Creatinine: 0.85 mg/dL (ref 0.60–1.30)
EGFR (African American): >60
EGFR (Non-African Amer.): >60

## 2013-04-11 ENCOUNTER — Emergency Department: Payer: Self-pay | Admitting: Emergency Medicine

## 2013-04-11 LAB — COMPREHENSIVE METABOLIC PANEL
ALBUMIN: 3.5 g/dL (ref 3.4–5.0)
ALT: 46 U/L (ref 12–78)
AST: 52 U/L — AB (ref 15–37)
Alkaline Phosphatase: 174 U/L — ABNORMAL HIGH
Anion Gap: 6 — ABNORMAL LOW (ref 7–16)
BUN: 9 mg/dL (ref 7–18)
Bilirubin,Total: 0.2 mg/dL (ref 0.2–1.0)
Calcium, Total: 8.7 mg/dL (ref 8.5–10.1)
Chloride: 104 mmol/L (ref 98–107)
Co2: 28 mmol/L (ref 21–32)
Creatinine: 0.77 mg/dL (ref 0.60–1.30)
EGFR (Non-African Amer.): >60
Glucose: 96 mg/dL (ref 65–99)
Osmolality: 274 (ref 275–301)
Potassium: 3.5 mmol/L (ref 3.5–5.1)
Sodium: 138 mmol/L (ref 136–145)
Total Protein: 8 g/dL (ref 6.4–8.2)

## 2013-04-11 LAB — CBC WITH DIFFERENTIAL/PLATELET
BANDS NEUTROPHIL: 15 %
COMMENT - H1-COM2: NORMAL
Eosinophil: 2 %
HCT: 35.1 % (ref 35.0–47.0)
HGB: 11.6 g/dL — AB (ref 12.0–16.0)
LYMPHS PCT: 6 %
MCH: 31.9 pg (ref 26.0–34.0)
MCHC: 33.1 g/dL (ref 32.0–36.0)
MCV: 96 fL (ref 80–100)
Monocytes: 2 %
PLATELETS: 118 10*3/uL — AB (ref 150–440)
RBC: 3.64 10*6/uL — AB (ref 3.80–5.20)
RDW: 15.8 % — AB (ref 11.5–14.5)
SEGMENTED NEUTROPHILS: 75 %
WBC: 20 10*3/uL — ABNORMAL HIGH (ref 3.6–11.0)

## 2013-04-11 LAB — PROTIME-INR
INR: 1
PROTHROMBIN TIME: 12.9 s (ref 11.5–14.7)

## 2013-04-20 ENCOUNTER — Ambulatory Visit: Payer: Self-pay | Admitting: Oncology

## 2013-04-22 LAB — COMPREHENSIVE METABOLIC PANEL
ALK PHOS: 166 U/L — AB
Albumin: 3.1 g/dL — ABNORMAL LOW (ref 3.4–5.0)
Anion Gap: 7 (ref 7–16)
BILIRUBIN TOTAL: 0.2 mg/dL (ref 0.2–1.0)
BUN: 8 mg/dL (ref 7–18)
CHLORIDE: 107 mmol/L (ref 98–107)
Calcium, Total: 8.5 mg/dL (ref 8.5–10.1)
Co2: 29 mmol/L (ref 21–32)
Creatinine: 0.87 mg/dL (ref 0.60–1.30)
EGFR (Non-African Amer.): >60
GLUCOSE: 133 mg/dL — AB (ref 65–99)
OSMOLALITY: 285 (ref 275–301)
Potassium: 3.6 mmol/L (ref 3.5–5.1)
SGOT(AST): 74 U/L — ABNORMAL HIGH (ref 15–37)
SGPT (ALT): 97 U/L — ABNORMAL HIGH (ref 12–78)
Sodium: 143 mmol/L (ref 136–145)
TOTAL PROTEIN: 7.2 g/dL (ref 6.4–8.2)

## 2013-04-22 LAB — CBC CANCER CENTER
BASOS PCT: 0.4 %
Basophil #: 0 x10 3/mm (ref 0.0–0.1)
Eosinophil #: 0 x10 3/mm (ref 0.0–0.7)
Eosinophil %: 0.1 %
HCT: 34 % — ABNORMAL LOW (ref 35.0–47.0)
HGB: 11 g/dL — AB (ref 12.0–16.0)
LYMPHS PCT: 22.7 %
Lymphocyte #: 1.5 x10 3/mm (ref 1.0–3.6)
MCH: 31.4 pg (ref 26.0–34.0)
MCHC: 32.4 g/dL (ref 32.0–36.0)
MCV: 97 fL (ref 80–100)
MONO ABS: 0.6 x10 3/mm (ref 0.2–0.9)
MONOS PCT: 9.5 %
NEUTROS PCT: 67.3 %
Neutrophil #: 4.4 x10 3/mm (ref 1.4–6.5)
PLATELETS: 62 x10 3/mm — AB (ref 150–440)
RBC: 3.5 10*6/uL — AB (ref 3.80–5.20)
RDW: 16.7 % — AB (ref 11.5–14.5)
WBC: 6.5 x10 3/mm (ref 3.6–11.0)

## 2013-04-22 LAB — MAGNESIUM: Magnesium: 1.9 mg/dL

## 2013-04-28 ENCOUNTER — Encounter: Payer: Self-pay | Admitting: Neurological Surgery

## 2013-04-29 LAB — CBC CANCER CENTER
BASOS ABS: 0 x10 3/mm (ref 0.0–0.1)
BASOS PCT: 0.8 %
EOS ABS: 0 x10 3/mm (ref 0.0–0.7)
Eosinophil %: 0.2 %
HCT: 32.2 % — ABNORMAL LOW (ref 35.0–47.0)
HGB: 10.5 g/dL — ABNORMAL LOW (ref 12.0–16.0)
Lymphocyte #: 1.1 x10 3/mm (ref 1.0–3.6)
Lymphocyte %: 35.5 %
MCH: 31.9 pg (ref 26.0–34.0)
MCHC: 32.7 g/dL (ref 32.0–36.0)
MCV: 97 fL (ref 80–100)
MONOS PCT: 13.4 %
Monocyte #: 0.4 x10 3/mm (ref 0.2–0.9)
Neutrophil #: 1.6 x10 3/mm (ref 1.4–6.5)
Neutrophil %: 50.1 %
Platelet: 107 x10 3/mm — ABNORMAL LOW (ref 150–440)
RBC: 3.31 10*6/uL — ABNORMAL LOW (ref 3.80–5.20)
RDW: 18.4 % — ABNORMAL HIGH (ref 11.5–14.5)
WBC: 3.2 x10 3/mm — ABNORMAL LOW (ref 3.6–11.0)

## 2013-04-29 LAB — COMPREHENSIVE METABOLIC PANEL
ALK PHOS: 127 U/L — AB
ALT: 76 U/L (ref 12–78)
ANION GAP: 7 (ref 7–16)
AST: 50 U/L — AB (ref 15–37)
Albumin: 3 g/dL — ABNORMAL LOW (ref 3.4–5.0)
BUN: 10 mg/dL (ref 7–18)
Bilirubin,Total: 0.3 mg/dL (ref 0.2–1.0)
CALCIUM: 8.4 mg/dL — AB (ref 8.5–10.1)
CHLORIDE: 104 mmol/L (ref 98–107)
CO2: 29 mmol/L (ref 21–32)
CREATININE: 0.85 mg/dL (ref 0.60–1.30)
EGFR (Non-African Amer.): >60
Glucose: 130 mg/dL — ABNORMAL HIGH (ref 65–99)
OSMOLALITY: 280 (ref 275–301)
Potassium: 3.6 mmol/L (ref 3.5–5.1)
SODIUM: 140 mmol/L (ref 136–145)
Total Protein: 7.1 g/dL (ref 6.4–8.2)

## 2013-04-30 LAB — CEA: CEA: 7.9 ng/mL — ABNORMAL HIGH (ref 0.0–4.7)

## 2013-05-02 ENCOUNTER — Emergency Department: Payer: Self-pay | Admitting: Emergency Medicine

## 2013-05-06 LAB — CBC CANCER CENTER
Basophil #: 0 x10 3/mm (ref 0.0–0.1)
Basophil %: 0.2 %
Eosinophil #: 0 x10 3/mm (ref 0.0–0.7)
Eosinophil %: 0.2 %
HCT: 34.7 % — ABNORMAL LOW (ref 35.0–47.0)
HGB: 11.1 g/dL — ABNORMAL LOW (ref 12.0–16.0)
LYMPHS ABS: 1.3 x10 3/mm (ref 1.0–3.6)
Lymphocyte %: 12.4 %
MCH: 31.4 pg (ref 26.0–34.0)
MCHC: 32 g/dL (ref 32.0–36.0)
MCV: 98 fL (ref 80–100)
MONO ABS: 0.6 x10 3/mm (ref 0.2–0.9)
MONOS PCT: 6.2 %
NEUTROS ABS: 8.4 x10 3/mm — AB (ref 1.4–6.5)
NEUTROS PCT: 81 %
Platelet: 48 x10 3/mm — ABNORMAL LOW (ref 150–440)
RBC: 3.54 10*6/uL — ABNORMAL LOW (ref 3.80–5.20)
RDW: 18.9 % — ABNORMAL HIGH (ref 11.5–14.5)
WBC: 10.4 x10 3/mm (ref 3.6–11.0)

## 2013-05-20 ENCOUNTER — Ambulatory Visit: Payer: Self-pay | Admitting: Oncology

## 2013-05-20 ENCOUNTER — Encounter: Payer: Self-pay | Admitting: Neurological Surgery

## 2013-05-26 LAB — CBC CANCER CENTER
BASOS ABS: 0 x10 3/mm (ref 0.0–0.1)
BASOS PCT: 1.4 %
EOS ABS: 0 x10 3/mm (ref 0.0–0.7)
EOS PCT: 1.2 %
HCT: 35.6 % (ref 35.0–47.0)
HGB: 11.6 g/dL — AB (ref 12.0–16.0)
LYMPHS PCT: 32.4 %
Lymphocyte #: 0.9 x10 3/mm — ABNORMAL LOW (ref 1.0–3.6)
MCH: 32.4 pg (ref 26.0–34.0)
MCHC: 32.6 g/dL (ref 32.0–36.0)
MCV: 99 fL (ref 80–100)
MONO ABS: 0.4 x10 3/mm (ref 0.2–0.9)
Monocyte %: 14.1 %
NEUTROS ABS: 1.4 x10 3/mm (ref 1.4–6.5)
Neutrophil %: 50.9 %
Platelet: 118 x10 3/mm — ABNORMAL LOW (ref 150–440)
RBC: 3.59 10*6/uL — ABNORMAL LOW (ref 3.80–5.20)
RDW: 19.9 % — ABNORMAL HIGH (ref 11.5–14.5)
WBC: 2.8 x10 3/mm — AB (ref 3.6–11.0)

## 2013-05-26 LAB — COMPREHENSIVE METABOLIC PANEL
ALK PHOS: 137 U/L — AB
Albumin: 3.4 g/dL (ref 3.4–5.0)
Anion Gap: 7 (ref 7–16)
BUN: 5 mg/dL — ABNORMAL LOW (ref 7–18)
Bilirubin,Total: 0.3 mg/dL (ref 0.2–1.0)
CO2: 31 mmol/L (ref 21–32)
Calcium, Total: 8.8 mg/dL (ref 8.5–10.1)
Chloride: 105 mmol/L (ref 98–107)
Creatinine: 0.79 mg/dL (ref 0.60–1.30)
Glucose: 87 mg/dL (ref 65–99)
OSMOLALITY: 282 (ref 275–301)
POTASSIUM: 4.1 mmol/L (ref 3.5–5.1)
SGOT(AST): 47 U/L — ABNORMAL HIGH (ref 15–37)
SGPT (ALT): 62 U/L (ref 12–78)
Sodium: 143 mmol/L (ref 136–145)
TOTAL PROTEIN: 8.1 g/dL (ref 6.4–8.2)

## 2013-05-27 LAB — CEA: CEA: 10.6 ng/mL — ABNORMAL HIGH (ref 0.0–4.7)

## 2013-06-02 ENCOUNTER — Ambulatory Visit: Payer: Self-pay | Admitting: Oncology

## 2013-06-20 ENCOUNTER — Ambulatory Visit: Payer: Self-pay | Admitting: Oncology

## 2013-06-20 ENCOUNTER — Encounter: Payer: Self-pay | Admitting: Neurological Surgery

## 2013-08-08 ENCOUNTER — Ambulatory Visit: Payer: Self-pay | Admitting: Oncology

## 2013-08-09 LAB — CEA: CEA: 6.1 ng/mL — AB (ref 0.0–4.7)

## 2013-08-20 ENCOUNTER — Ambulatory Visit: Payer: Self-pay | Admitting: Oncology

## 2013-09-22 ENCOUNTER — Ambulatory Visit: Payer: Self-pay | Admitting: Oncology

## 2013-09-22 LAB — CBC CANCER CENTER
Basophil #: 0 x10 3/mm (ref 0.0–0.1)
Basophil %: 0.7 %
Eosinophil #: 0 x10 3/mm (ref 0.0–0.7)
Eosinophil %: 2.1 %
HCT: 37.7 % (ref 35.0–47.0)
HGB: 12.2 g/dL (ref 12.0–16.0)
LYMPHS ABS: 1.1 x10 3/mm (ref 1.0–3.6)
Lymphocyte %: 48.8 %
MCH: 31.6 pg (ref 26.0–34.0)
MCHC: 32.5 g/dL (ref 32.0–36.0)
MCV: 97 fL (ref 80–100)
Monocyte #: 0.2 x10 3/mm (ref 0.2–0.9)
Monocyte %: 9.2 %
NEUTROS PCT: 39.2 %
Neutrophil #: 0.9 x10 3/mm — ABNORMAL LOW (ref 1.4–6.5)
Platelet: 126 x10 3/mm — ABNORMAL LOW (ref 150–440)
RBC: 3.88 10*6/uL (ref 3.80–5.20)
RDW: 14.1 % (ref 11.5–14.5)
WBC: 2.3 x10 3/mm — ABNORMAL LOW (ref 3.6–11.0)

## 2013-09-22 LAB — COMPREHENSIVE METABOLIC PANEL
ALBUMIN: 3.3 g/dL — AB (ref 3.4–5.0)
ALT: 19 U/L
ANION GAP: 8 (ref 7–16)
Alkaline Phosphatase: 70 U/L
BUN: 8 mg/dL (ref 7–18)
Bilirubin,Total: 0.3 mg/dL (ref 0.2–1.0)
CO2: 28 mmol/L (ref 21–32)
CREATININE: 0.87 mg/dL (ref 0.60–1.30)
Calcium, Total: 8.1 mg/dL — ABNORMAL LOW (ref 8.5–10.1)
Chloride: 105 mmol/L (ref 98–107)
EGFR (African American): >60
Glucose: 88 mg/dL (ref 65–99)
OSMOLALITY: 279 (ref 275–301)
Potassium: 3.7 mmol/L (ref 3.5–5.1)
SGOT(AST): 23 U/L (ref 15–37)
Sodium: 141 mmol/L (ref 136–145)
TOTAL PROTEIN: 7.3 g/dL (ref 6.4–8.2)

## 2013-09-23 LAB — CEA: CEA: 5.9 ng/mL — ABNORMAL HIGH (ref 0.0–4.7)

## 2013-10-20 ENCOUNTER — Ambulatory Visit: Payer: Self-pay | Admitting: Oncology

## 2013-11-20 ENCOUNTER — Ambulatory Visit: Payer: Self-pay | Admitting: Oncology

## 2013-11-21 ENCOUNTER — Encounter: Payer: Self-pay | Admitting: General Surgery

## 2013-11-22 LAB — CBC CANCER CENTER
BASOS ABS: 0 x10 3/mm (ref 0.0–0.1)
BASOS PCT: 0.7 %
EOS PCT: 0.6 %
Eosinophil #: 0 x10 3/mm (ref 0.0–0.7)
HCT: 40 % (ref 35.0–47.0)
HGB: 13.1 g/dL (ref 12.0–16.0)
Lymphocyte #: 1.2 x10 3/mm (ref 1.0–3.6)
Lymphocyte %: 43.1 %
MCH: 32.1 pg (ref 26.0–34.0)
MCHC: 32.8 g/dL (ref 32.0–36.0)
MCV: 98 fL (ref 80–100)
Monocyte #: 0.1 x10 3/mm — ABNORMAL LOW (ref 0.2–0.9)
Monocyte %: 4.5 %
Neutrophil #: 1.5 x10 3/mm (ref 1.4–6.5)
Neutrophil %: 51.1 %
Platelet: 150 x10 3/mm (ref 150–440)
RBC: 4.09 10*6/uL (ref 3.80–5.20)
RDW: 13.4 % (ref 11.5–14.5)
WBC: 2.9 x10 3/mm — ABNORMAL LOW (ref 3.6–11.0)

## 2013-11-22 LAB — COMPREHENSIVE METABOLIC PANEL
ALK PHOS: 83 U/L
AST: 24 U/L (ref 15–37)
Albumin: 3.7 g/dL (ref 3.4–5.0)
Anion Gap: 5 — ABNORMAL LOW (ref 7–16)
BILIRUBIN TOTAL: 0.4 mg/dL (ref 0.2–1.0)
BUN: 12 mg/dL (ref 7–18)
CO2: 31 mmol/L (ref 21–32)
Calcium, Total: 9.3 mg/dL (ref 8.5–10.1)
Chloride: 102 mmol/L (ref 98–107)
Creatinine: 0.84 mg/dL (ref 0.60–1.30)
EGFR (African American): >60
EGFR (Non-African Amer.): >60
Glucose: 101 mg/dL — ABNORMAL HIGH (ref 65–99)
OSMOLALITY: 276 (ref 275–301)
POTASSIUM: 3.4 mmol/L — AB (ref 3.5–5.1)
SGPT (ALT): 19 U/L
Sodium: 138 mmol/L (ref 136–145)
Total Protein: 8.1 g/dL (ref 6.4–8.2)

## 2013-11-23 LAB — CEA: CEA: 6.6 ng/mL — AB (ref 0.0–4.7)

## 2013-12-07 ENCOUNTER — Ambulatory Visit: Payer: Self-pay | Admitting: Oncology

## 2013-12-07 LAB — TSH: THYROID STIMULATING HORM: 0.724 u[IU]/mL

## 2013-12-09 ENCOUNTER — Ambulatory Visit: Payer: Self-pay | Admitting: Gastroenterology

## 2013-12-20 ENCOUNTER — Ambulatory Visit: Payer: Self-pay | Admitting: Oncology

## 2014-01-20 ENCOUNTER — Ambulatory Visit: Payer: Self-pay | Admitting: Oncology

## 2014-02-03 ENCOUNTER — Other Ambulatory Visit: Payer: Self-pay

## 2014-02-16 LAB — COMPREHENSIVE METABOLIC PANEL
ANION GAP: 6 — AB (ref 7–16)
Albumin: 3.3 g/dL — ABNORMAL LOW (ref 3.4–5.0)
Alkaline Phosphatase: 83 U/L (ref 46–116)
BUN: 11 mg/dL (ref 7–18)
Bilirubin,Total: 0.3 mg/dL (ref 0.2–1.0)
CALCIUM: 8.3 mg/dL — AB (ref 8.5–10.1)
CHLORIDE: 104 mmol/L (ref 98–107)
Co2: 31 mmol/L (ref 21–32)
Creatinine: 0.81 mg/dL (ref 0.60–1.30)
EGFR (Non-African Amer.): >60
GLUCOSE: 90 mg/dL (ref 65–99)
Osmolality: 280 (ref 275–301)
Potassium: 4.1 mmol/L (ref 3.5–5.1)
SGOT(AST): 21 U/L (ref 15–37)
SGPT (ALT): 18 U/L (ref 14–63)
Sodium: 141 mmol/L (ref 136–145)
Total Protein: 7.6 g/dL (ref 6.4–8.2)

## 2014-02-16 LAB — CBC CANCER CENTER
Basophil #: 0 x10 3/mm (ref 0.0–0.1)
Basophil %: 0.8 %
EOS PCT: 3.6 %
Eosinophil #: 0.1 x10 3/mm (ref 0.0–0.7)
HCT: 39 % (ref 35.0–47.0)
HGB: 12.8 g/dL (ref 12.0–16.0)
LYMPHS ABS: 1.3 x10 3/mm (ref 1.0–3.6)
Lymphocyte %: 41.6 %
MCH: 32.2 pg (ref 26.0–34.0)
MCHC: 32.9 g/dL (ref 32.0–36.0)
MCV: 98 fL (ref 80–100)
Monocyte #: 0.2 x10 3/mm (ref 0.2–0.9)
Monocyte %: 7.3 %
Neutrophil #: 1.4 x10 3/mm (ref 1.4–6.5)
Neutrophil %: 46.7 %
Platelet: 134 x10 3/mm — ABNORMAL LOW (ref 150–440)
RBC: 3.98 10*6/uL (ref 3.80–5.20)
RDW: 13.3 % (ref 11.5–14.5)
WBC: 3.1 x10 3/mm — ABNORMAL LOW (ref 3.6–11.0)

## 2014-02-20 ENCOUNTER — Ambulatory Visit: Payer: Self-pay | Admitting: Oncology

## 2014-03-21 ENCOUNTER — Ambulatory Visit
Admit: 2014-03-21 | Disposition: A | Discharge status: Home / Self Care | Payer: Self-pay | Attending: Oncology | Admitting: Oncology

## 2014-05-10 ENCOUNTER — Ambulatory Visit
Admit: 2014-05-10 | Disposition: A | Discharge status: Home / Self Care | Payer: Self-pay | Attending: Oncology | Admitting: Oncology

## 2014-05-10 LAB — CBC CANCER CENTER
Basophil #: 0 x10 3/mm (ref 0.0–0.1)
Basophil %: 0.9 %
EOS PCT: 1.1 %
Eosinophil #: 0 x10 3/mm (ref 0.0–0.7)
HCT: 35.6 % (ref 35.0–47.0)
HGB: 11.8 g/dL — ABNORMAL LOW (ref 12.0–16.0)
Lymphocyte #: 1.3 x10 3/mm (ref 1.0–3.6)
Lymphocyte %: 44.3 %
MCH: 32.1 pg (ref 26.0–34.0)
MCHC: 33 g/dL (ref 32.0–36.0)
MCV: 97 fL (ref 80–100)
MONOS PCT: 5.2 %
Monocyte #: 0.2 x10 3/mm (ref 0.2–0.9)
NEUTROS PCT: 48.5 %
Neutrophil #: 1.4 x10 3/mm (ref 1.4–6.5)
PLATELETS: 134 x10 3/mm — AB (ref 150–440)
RBC: 3.66 10*6/uL — AB (ref 3.80–5.20)
RDW: 13 % (ref 11.5–14.5)
WBC: 2.9 x10 3/mm — AB (ref 3.6–11.0)

## 2014-05-10 LAB — COMPREHENSIVE METABOLIC PANEL
ALK PHOS: 62 U/L
ALT: 17 U/L
AST: 29 U/L
Albumin: 3.5 g/dL
Anion Gap: 6 — ABNORMAL LOW (ref 7–16)
BILIRUBIN TOTAL: 0.6 mg/dL
BUN: 12 mg/dL
CO2: 28 mmol/L
CREATININE: 0.74 mg/dL
Calcium, Total: 8.7 mg/dL — ABNORMAL LOW
Chloride: 105 mmol/L
EGFR (African American): >60
Glucose: 128 mg/dL — ABNORMAL HIGH
POTASSIUM: 4 mmol/L
Sodium: 139 mmol/L
TOTAL PROTEIN: 7.1 g/dL

## 2014-05-10 LAB — TSH: THYROID STIMULATING HORM: 0.567 u[IU]/mL

## 2014-05-11 LAB — CEA: CEA: 7.1 ng/mL — ABNORMAL HIGH (ref 0.0–4.7)

## 2014-05-12 NOTE — Discharge Summary (Signed)
PATIENT NAME:  Amy Bird, Amy Bird MR#:  [id number] DATE OF BIRTH:  February 24, 1950  DATE OF ADMISSION:  12/23/2012 DATE OF DISCHARGE: 12/27/2012   HISTORY OF PRESENT ILLNESS: This 64 year old female with recent findings of anemia requiring blood transfusions, had recent colonoscopy findings of cancer of the ascending colon.   PAST MEDICAL HISTORY: Also includes breast cancer.  She had physical findings of clear lungs. Heart regular rhythm, S1 and S2. Abdomen was soft, flat and nontender.   HOSPITAL COURSE: She was brought in through the outpatient surgery department and had bowel preparation at home. She had a preop prophylactic antibiotic and had a laparoscopic right colectomy with findings of a mass in the ascending colon. Postoperatively, she was treated with prophylactic subcutaneous heparin and  IV fluids. Walking was encouraged. She did gradually increase her activities. She began a clear liquid diet and has gradually advanced up to a soft diet. She has had some diarrhea and is passing gas, now doing very well. On examination, the abdomen is soft and flat and nontender. Incisions are healing satisfactorily.   DIAGNOSIS: Carcinoma of the right colon. Final pathology pending.   OPERATION: Laparoscopic right colectomy.   Instructions given to take Tylenol if needed for pain and she will follow up in the office in 4 days for suture removal and will plan to call her when we get the pathology report. Also anticipate oncology followup as well.  ____________________________ J. Rochel Brome, MD jws:aw D: 12/27/2012 12:53:08 ET T: 12/27/2012 13:17:32 ET JOB#: [id number]  cc: Loreli Dollar, MD, <Dictator> Loreli Dollar MD ELECTRONICALLY SIGNED 12/28/2012 19:28

## 2014-05-12 NOTE — Op Note (Signed)
PATIENT NAME:  Amy Bird, Amy Bird MR#:  [id number] DATE OF BIRTH:  05/30/1950  DATE OF PROCEDURE:  01/18/2013  PREOPERATIVE DIAGNOSIS: Colon cancer.   POSTOPERATIVE DIAGNOSIS: Colon cancer.   PROCEDURE PERFORMED: Insertion of central venous catheter with subcutaneous infusion port.   SURGEON: Rochel Brome, MD   ANESTHESIA: General.  The patient was placed on the operating table in the supine position and was sedated and monitored by the anesthesia staff. A rolled sheet was placed behind the shoulder blades. Her neck was extended and head was turned 20 degrees to the left. The neck and right subclavian areas were prepared with ChloraPrep, draped in a sterile manner.  The skin beneath the clavicle was infiltrated with 1% Xylocaine. A transversely oriented 3 cm incision was made, carried down through subcutaneous tissues and a subcutaneous pouch was created and just anterior to the deep fascia large enough to admit the [place] port. Several small bleeding points were cauterized.  Ultrasound was used to image the jugular vein and also could identify the carotid artery. The vein appeared normal. The skin overlying the jugular vein was infiltrated with 1% Xylocaine. A transversely oriented 5 mm incision was made, carried down through the subcutaneous tissues. Next, with the patient in the Trendelenburg position, the subclavian vein was cannulated with a needle using ultrasound guidance. A guidewire was inserted into the vena cava and the needle withdrawn. An ultrasound image was saved for the paper chart.  Next, fluoroscopy was used to demonstrate the position of the guidewire in the vena cava.  Next, the needle was withdrawn. The dilator and introducer sheath were advanced over the guidewire. The guidewire and dilator were removed. There was some venous back bleeding. The catheter was inserted, and the sheath peeled away. The catheter was pulled back to a point some 13 cm from the skin. Another fluoroscopic  image was made and demonstrated location of the catheter tip in the superior vena cava in satisfactory condition and position, and a fluoroscopic image was saved for the paper chart.  Next, the catheter was tunneled to the subclavian site and pressure held over the tunnel site. Next, the catheter was cut to fit and attached to the [place] port using the accompanying sleeve to secure it. The port was accessed with a Huber needle, aspirated a trace of blood and flushed with 10 mL of heparinized saline solution. The port was placed into the subcutaneous pouch and sutured to the fascia with 4-0 silk.  Next, seeing hemostasis was intact, the pouch was closed with 5-0 Vicryl. Both skin incisions were closed with 5-0 Vicryl subcuticular suture and Dermabond. The patient tolerated surgery satisfactorily and was then prepared for transfer to the recovery room.   ____________________________ Lenna Sciara. Rochel Brome, MD jws:ce D: 01/18/2013 16:11:10 ET T: 01/18/2013 19:48:26 ET JOB#: [id number]  cc: Loreli Dollar, MD, <Dictator> Loreli Dollar MD ELECTRONICALLY SIGNED 01/19/2013 16:54

## 2014-05-12 NOTE — Op Note (Signed)
PATIENT NAME:  Amy Bird, Amy Bird MR#:  [id number] DATE OF BIRTH:  01-05-1951  DATE OF PROCEDURE:  12/23/2012  PREOPERATIVE DIAGNOSIS: Carcinoma of the ascending colon.   POSTOPERATIVE DIAGNOSIS: Carcinoma of the ascending colon.   PROCEDURE: Laparoscopic right colectomy.   SURGEON: Rochel Brome, MD  ANESTHESIA: General.   INDICATIONS: This 64 year old female had recent findings of severe anemia requiring blood transfusions. Has had colonoscopy demonstrating a cancer of the ascending colon. Surgery was recommended for definitive treatment.   DESCRIPTION OF PROCEDURE: The patient was placed on the operating table in the supine position under general endotracheal anesthesia. The abdomen was prepared with ChloraPrep and draped in a sterile manner.   A short incision was made just below the umbilicus, at the site of an old midline scar, carried down to the deep fascia which was grasped with laryngeal hook and elevated. Next, a bone hook was placed into the fascia for traction. The 0 degree laparoscope was inserted into the trocar and focused on the tissues and trocar was advanced down through the abdominal wall into the peritoneal cavity viewing this with the laparoscope. Subsequently, the peritoneal cavity was inflated with carbon dioxide. The bowel was observed. Liver appeared normal. Next, another incision was made in the epigastrium just about an inch above the navel to insert a 10 mm cannula and 2 other incisions were made in the right upper quadrant and the right lower quadrant to introduce two 5 mm cannulas.   The right colon was identified. There was some scarring which appeared to be related to previous appendectomy. The right colon was mobilized with incision of the lateral peritoneal reflection using the Harmonic scalpel. The terminal ileum was mobilized and the right transverse colon was mobilized. The duodenum was identified. The laparoscopic dissection was continued until the right colon  could be brought over to the left side demonstrating satisfactory mobilization. Next, the laparoscopic instruments were removed and the two 5 mm port sites were closed with 4-0 nylon vertical mattress sutures. Next, an incision was made from the infraumbilical port to the supraumbilical port and brought the bowel out on the abdominal wall. There was a palpable mass in the ascending colon which was some 4 cm in dimension. There was some palpable adenopathy in the region of the ileocolic vessels. Next, a proximal site for margin of resection was selected in the mesentery of the terminal ileum just approximately 4 inches proximal to the ileocecal valve and a small window was made in the mesentery and the dissection was begun with the Harmonic scalpel. Another site for the margin of resection in the transverse colon was selected just slightly to the right of the middle colic vessels. A window was created in the transverse mesentery and the dissection was begun with the Harmonic scalpel. Also, the omentum was divided in the midline with the Harmonic scalpel. The ileocolic vessels were palpated and several vessels were ligated with 0 chromic and then divided with the Harmonic scalpel. It appeared that when the ileocolic artery was occluded I could still palpate a pulse in the mesentery of the ileum proximal to the margin of resection. The mesenteric dissection was completed. Next, the small bowel was placed beside the transverse colon and enterotomy was made and also a colotomy made as the small bowel and large bowel were held with Allis clamps. Anastomosis was begun with introduction of the GIA 75 stapler, which was placed along the antimesenteric border of the small bowel and the large bowel. It was  engaged and activated. I did not use the whole length of the stapler, but just enough for adequate anastomosis. The staple line was hemostatic. Next, the anastomosis was completed with application of the FG-18 stapler, which  was placed perpendicular to the first, engaged, and activated, and the bowel was separated from the staple line with the scalpel. Specimen was passed off to a side table and submitted in formalin for routine pathology. The anastomosis was inspected. Several small bleeding points were cauterized. The junction of the staple line was imbricated with 5-0 Vicryl. The mesenteric defect was closed with running 5-0 Vicryl. The anastomosis looked good and was widely patent. It was returned to the abdominal cavity.   Next, gown and gloves and the instruments were changed. The site was draped with sterile towels. Next, a pull suction was introduced into what was the right colic gutter. There was minimal amount of serosanguineous fluid. Hemostasis appeared to be intact. Next, omentum was brought beneath the wound and the midline fascia was closed with interrupted 0 Maxon figure-of-eight sutures. Skin was closed with interrupted 4-0 nylon vertical mattress sutures. Dressings were applied with paper tape. The patient tolerated the surgery satisfactorily and was then prepared for transfer to the recovery room.  ___________________________ Lenna Sciara. Rochel Brome, MD jws:sb D: 12/23/2012 15:20:06 ET T: 12/23/2012 15:47:13 ET JOB#: [id number]  cc: Loreli Dollar, MD, <Dictator> Loreli Dollar MD ELECTRONICALLY SIGNED 12/28/2012 19:28

## 2014-06-07 DIAGNOSIS — Z452 Encounter for adjustment and management of vascular access device: Secondary | ICD-10-CM | POA: Diagnosis not present

## 2014-08-10 DIAGNOSIS — G620 Drug-induced polyneuropathy: Secondary | ICD-10-CM | POA: Insufficient documentation

## 2014-08-10 DIAGNOSIS — G8929 Other chronic pain: Secondary | ICD-10-CM | POA: Diagnosis not present

## 2014-08-10 DIAGNOSIS — T451X5A Adverse effect of antineoplastic and immunosuppressive drugs, initial encounter: Secondary | ICD-10-CM

## 2014-08-10 DIAGNOSIS — E782 Mixed hyperlipidemia: Secondary | ICD-10-CM | POA: Diagnosis not present

## 2014-08-10 DIAGNOSIS — R1031 Right lower quadrant pain: Secondary | ICD-10-CM | POA: Diagnosis not present

## 2014-08-10 DIAGNOSIS — R1013 Epigastric pain: Secondary | ICD-10-CM | POA: Diagnosis not present

## 2014-08-11 ENCOUNTER — Other Ambulatory Visit: Payer: Self-pay | Admitting: Internal Medicine

## 2014-08-11 DIAGNOSIS — G8929 Other chronic pain: Secondary | ICD-10-CM

## 2014-08-11 DIAGNOSIS — R1031 Right lower quadrant pain: Principal | ICD-10-CM

## 2014-08-17 ENCOUNTER — Ambulatory Visit: Payer: Self-pay

## 2014-08-17 ENCOUNTER — Ambulatory Visit
Admission: RE | Admit: 2014-08-17 | Discharge: 2014-08-17 | Disposition: A | Discharge status: Home / Self Care | Payer: Medicare PPO | Source: Ambulatory Visit | Attending: Internal Medicine | Admitting: Internal Medicine

## 2014-08-17 DIAGNOSIS — R1031 Right lower quadrant pain: Secondary | ICD-10-CM | POA: Diagnosis not present

## 2014-08-17 DIAGNOSIS — K6389 Other specified diseases of intestine: Secondary | ICD-10-CM | POA: Diagnosis not present

## 2014-08-17 DIAGNOSIS — G8929 Other chronic pain: Secondary | ICD-10-CM | POA: Diagnosis not present

## 2014-08-17 HISTORY — DX: Malignant neoplasm of unspecified site of left female breast: C50.912

## 2014-08-17 HISTORY — DX: Malignant neoplasm of colon, unspecified: C18.9

## 2014-08-17 MED ORDER — IOHEXOL 300 MG/ML  SOLN
100.0000 mL | Freq: Once | INTRAMUSCULAR | Status: AC | PRN
  Administered 2014-08-17: 100 mL via INTRAVENOUS

## 2014-08-25 DIAGNOSIS — H4011X1 Primary open-angle glaucoma, mild stage: Secondary | ICD-10-CM | POA: Diagnosis not present

## 2014-09-07 DIAGNOSIS — H4011X1 Primary open-angle glaucoma, mild stage: Secondary | ICD-10-CM | POA: Diagnosis not present

## 2014-09-18 DIAGNOSIS — M545 Low back pain: Secondary | ICD-10-CM | POA: Diagnosis not present

## 2014-09-18 DIAGNOSIS — G629 Polyneuropathy, unspecified: Secondary | ICD-10-CM | POA: Diagnosis not present

## 2014-09-26 ENCOUNTER — Telehealth: Payer: Self-pay | Admitting: *Deleted

## 2014-09-26 NOTE — Telephone Encounter (Signed)
Called complaining of intermittent "energy drain" and asking what can be done about it

## 2014-09-27 NOTE — Telephone Encounter (Signed)
ASked her to see her PMD she reports her PMD is Glendon Axe

## 2014-10-16 ENCOUNTER — Telehealth: Payer: Self-pay | Admitting: *Deleted

## 2014-10-16 NOTE — Telephone Encounter (Signed)
Going back to see Dr Luther Hearing at [facility] regarding headaches and eye pain, he wants to do an MRI. Just wanted to keep you in the loop

## 2014-10-19 DIAGNOSIS — G9389 Other specified disorders of brain: Secondary | ICD-10-CM | POA: Diagnosis not present

## 2014-10-25 ENCOUNTER — Encounter: Payer: Self-pay | Admitting: Family Medicine

## 2014-10-25 DIAGNOSIS — Z23 Encounter for immunization: Secondary | ICD-10-CM | POA: Diagnosis not present

## 2014-10-25 DIAGNOSIS — Z0000 Encounter for general adult medical examination without abnormal findings: Secondary | ICD-10-CM | POA: Diagnosis not present

## 2014-10-25 DIAGNOSIS — Z124 Encounter for screening for malignant neoplasm of cervix: Secondary | ICD-10-CM | POA: Diagnosis not present

## 2014-11-03 ENCOUNTER — Other Ambulatory Visit: Payer: Self-pay | Admitting: *Deleted

## 2014-11-03 DIAGNOSIS — C189 Malignant neoplasm of colon, unspecified: Secondary | ICD-10-CM

## 2014-11-08 ENCOUNTER — Encounter: Payer: Self-pay | Admitting: Oncology

## 2014-11-08 ENCOUNTER — Inpatient Hospital Stay: Payer: Medicare PPO | Attending: Oncology

## 2014-11-08 ENCOUNTER — Inpatient Hospital Stay (HOSPITAL_BASED_OUTPATIENT_CLINIC_OR_DEPARTMENT_OTHER): Payer: Medicare PPO | Admitting: Oncology

## 2014-11-08 VITALS — BP 109/66 | HR 101 | Temp 98.0°F | Ht 62.0 in | Wt 137.4 lb

## 2014-11-08 DIAGNOSIS — Z87891 Personal history of nicotine dependence: Secondary | ICD-10-CM | POA: Insufficient documentation

## 2014-11-08 DIAGNOSIS — Z853 Personal history of malignant neoplasm of breast: Secondary | ICD-10-CM

## 2014-11-08 DIAGNOSIS — D649 Anemia, unspecified: Secondary | ICD-10-CM | POA: Insufficient documentation

## 2014-11-08 DIAGNOSIS — C189 Malignant neoplasm of colon, unspecified: Secondary | ICD-10-CM

## 2014-11-08 DIAGNOSIS — I890 Lymphedema, not elsewhere classified: Secondary | ICD-10-CM | POA: Insufficient documentation

## 2014-11-08 DIAGNOSIS — Z85038 Personal history of other malignant neoplasm of large intestine: Secondary | ICD-10-CM | POA: Insufficient documentation

## 2014-11-08 DIAGNOSIS — N3000 Acute cystitis without hematuria: Secondary | ICD-10-CM | POA: Insufficient documentation

## 2014-11-08 DIAGNOSIS — Z9221 Personal history of antineoplastic chemotherapy: Secondary | ICD-10-CM | POA: Diagnosis not present

## 2014-11-08 DIAGNOSIS — Z79899 Other long term (current) drug therapy: Secondary | ICD-10-CM

## 2014-11-08 DIAGNOSIS — D709 Neutropenia, unspecified: Secondary | ICD-10-CM | POA: Insufficient documentation

## 2014-11-08 DIAGNOSIS — Z9013 Acquired absence of bilateral breasts and nipples: Secondary | ICD-10-CM | POA: Diagnosis not present

## 2014-11-08 LAB — COMPREHENSIVE METABOLIC PANEL
ALBUMIN: 4 g/dL (ref 3.5–5.0)
ALK PHOS: 74 U/L (ref 38–126)
ALT: 13 U/L — ABNORMAL LOW (ref 14–54)
ANION GAP: 4 — AB (ref 5–15)
AST: 27 U/L (ref 15–41)
BILIRUBIN TOTAL: 0.7 mg/dL (ref 0.3–1.2)
BUN: 12 mg/dL (ref 6–20)
CALCIUM: 8.4 mg/dL — AB (ref 8.9–10.3)
CO2: 28 mmol/L (ref 22–32)
Chloride: 105 mmol/L (ref 101–111)
Creatinine, Ser: 0.85 mg/dL (ref 0.44–1.00)
GFR calc Af Amer: >60 mL/min (ref >60)
GLUCOSE: 137 mg/dL — AB (ref 65–99)
Potassium: 3.5 mmol/L (ref 3.5–5.1)
Sodium: 137 mmol/L (ref 135–145)
TOTAL PROTEIN: 7.5 g/dL (ref 6.5–8.1)

## 2014-11-08 LAB — CBC WITH DIFFERENTIAL/PLATELET
BASOS PCT: 1 %
Basophils Absolute: 0 10*3/uL (ref 0–0.1)
Eosinophils Absolute: 0 10*3/uL (ref 0–0.7)
Eosinophils Relative: 1 %
HEMATOCRIT: 37.7 % (ref 35.0–47.0)
HEMOGLOBIN: 12.5 g/dL (ref 12.0–16.0)
Lymphocytes Relative: 38 %
Lymphs Abs: 1.2 10*3/uL (ref 1.0–3.6)
MCH: 31.8 pg (ref 26.0–34.0)
MCHC: 33.3 g/dL (ref 32.0–36.0)
MCV: 95.5 fL (ref 80.0–100.0)
MONOS PCT: 6 %
Monocytes Absolute: 0.2 10*3/uL (ref 0.2–0.9)
NEUTROS PCT: 54 %
Neutro Abs: 1.7 10*3/uL (ref 1.4–6.5)
Platelets: 147 10*3/uL — ABNORMAL LOW (ref 150–440)
RBC: 3.95 MIL/uL (ref 3.80–5.20)
RDW: 13.1 % (ref 11.5–14.5)
WBC: 3.1 10*3/uL — ABNORMAL LOW (ref 3.6–11.0)

## 2014-11-08 NOTE — Progress Notes (Signed)
[facility] Telephone:([phone]) [phone]  Fax:(336) [place]: August 25, 1950  MR#: [id number]  RZN#:[id number]  Patient Care Team: Glendon Axe, MD as PCP - General (Internal Medicine)  CHIEF COMPLAINT:  Chief Complaint  Patient presents with  . Colon Cancer    follow up, fatigue   Subjective: Chief Complaint/Diagnosis:   1. Adenocarcinoma of the breast. Cytoxan and Adriamycin completed in July of 2002. Herceptin and Taxotere in August 2002. 2. Status post bilateral mastectomy 3. Lymphedema on the left upper extremity 4. Anemia.  Neutropenia.  Microcytosis.  (October, 2014) Bone marrow aspiration and biopsy was negative for any myelodysplastic syndrome (October, 2014 Iron studies are suggestive fine deficiency anemia Bone marrow did not reveal any iron storage 5. Right-sided colon cancer status post hemicolectomy in December of 2014 T3, N1, M0 tumor stage III 6. Patient started on chemotherapy with FOLFOX PLUS or MINUS Celecoxib orally ACCORDING TO [facility] protocol 7. Patient elected to withdraw from the trial effective 02/25/13   INTERVAL HISTORY:  64 year old lady with history of carcinoma of breast and history of colon cancer came today further follow-up.  No chills.  No fever.  Patient continues to feel very weak and tired.  Appetite has been stable. But has lost 4 pounds of weight. She  complains of generalized weakness and fatigue  REVIEW OF SYSTEMS:    general status: Patient is feeling weak and tired.  No change in a performance status.  No chills.  No fever.  has lost 4 more pounds of weight HEENT: Alopecia.  No evidence of stomatitis Lungs: No cough or shortness of breath Cardiac: No chest pain or paroxysmal nocturnal dyspnea GI: No nausea no vomiting no diarrhea no abdominal pain Skin: No rash Lower extremity no swelling Neurological system: No tingling.  No numbness.  No other focal signs Musculoskeletal system no bony pains  Review of  systems:  All other systems reviewed and found to be negative. As per HPI. Otherwise, a complete review of systems is negatve.  PAST MEDICAL HISTORY: Past Medical History  Diagnosis Date  . Arthritis   . Glaucoma   . Abdominal pain 2013  . Hyperlipidemia 20 years  . Shingles   . Kidney stone   . Breast cyst   . Bronchitis   . Bruises easily   . Pneumonia   . Blood in urine   . Difficulty urinating   . Anemia   . Swollen lymph nodes   . Hearing loss   . Bleeding nose   . Breast cancer, left breast ([place]) 2002    Left Mastectomy, chemo + Rad tx's.  . Colon cancer ([place]) 10/2012    Partial colon resection and chemo tx's.    PAST SURGICAL HISTORY: Past Surgical History  Procedure Laterality Date  . Mastectomy Bilateral 2002,2010  . Colonoscopy  2004  . Salpingo oophorectmy   1973  . Kidney stone removal     . Cesarean section    . Abdominal hysterectomy  1973  . Appendectomy  1969    FAMILY HISTORY Family History  Problem Relation Age of Onset  . Hypertension Mother   . Alzheimer's disease Mother   . Cancer Father     prostate  . Cancer Sister     breast  . Hypertension Sister   . Cancer Brother     stomach  . Hypertension Brother   . Sarcoidosis Other     positive family history.  . Cancer Sister  breast  . Cancer Sister     breast    ADVANCED DIRECTIVES:  Patient does have advance healthcare directive, Patient   does not desire to make any changes HEALTH MAINTENANCE: Social History  Substance Use Topics  . Smoking status: Former Smoker -- 1.00 packs/day for 20 years    Types: Cigarettes  . Smokeless tobacco: None  . Alcohol Use: No      Allergies  Allergen Reactions  . Sulfa Antibiotics Swelling  . Hydrocodone-Ibuprofen Other (See Comments)    Patient feels like there is a heavy wight on her chest  . Statins Other (See Comments)    Leg cramps and groin pain  . Other Itching    Tree Nuts  . Tape Rash     Significant History/PMH:     Colon Cancer:    Breast Ca:    Glaucoma:    high cholesterol:    Lap Right Colectomy:    Bilateral Mastectomy:    Hysterectomy -: 1973  Preventive Screening:  Has patient had any of the following test? Mammography (1)   Last Mammography: 2010(1)   PFSH: Family History: Positive for breast cancer in sister.  Cancer of liver probably metastatic disease in other family member.  Also positive for diabetes and anemia in the family.  Social History: negative alcohol, positive tobacco  Smoker: Smoking cessation counseling performed  Additional Past Medical and Surgical History: Past Medical History:  Kidney stones, arthritis, vertebral fracture.      Past Surgical History: Hysterectomy in April 1973, Appendectomy in March 1969, Breast biopsy in 1986 which was benign, Mastectomy in March 2002.  Mastectomy in February 2010.    OBJECTIVE:  Filed Vitals:   11/08/14 1535  BP: 109/66  Pulse: 101  Temp: 98 F (36.7 C)     Body mass index is 25.12 kg/(m^2).    ECOG FS:1 - Symptomatic but completely ambulatory  PHYSICAL EXAM: General  status: Performance status is good.  Patient has not lost significant weight. Since last evaluation there is no significant change in the general status HEENT: No evidence of stomatitis. Sclera and conjunctivae :: No jaundice.   pale looking. Lungs: Air  entry equal on both sides.  No rhonchi.  No rales.  Cardiac: Heart sounds are normal.  No pericardial rub.  No murmur. Lymphatic system: Cervical, axillary, inguinal, lymph nodes not palpable GI: Abdomen is soft.liver and spleen not palpable.  No ascites.  Bowel sounds are normal.  No other palpable masses.  No tenderness . Lower extremity: No edema Neurological system: Higher functions, cranial nerves intact no evidence of peripheral neuropathy. Skin: No rash.  No ecchymosis.. No petechial hemorrhages  chest: Bilateral mastectomy no evidence of recurrent disease   LAB RESULTS:  CBC Latest Ref Rng  11/08/2014 05/10/2014  WBC 3.6 - 11.0 K/uL 3.1(L) 2.9(L)  Hemoglobin 12.0 - 16.0 g/dL 12.5 11.8(L)  Hematocrit 35.0 - 47.0 % 37.7 35.6  Platelets 150 - 440 K/uL 147(L) 134(L)    Appointment on 11/08/2014  Component Date Value Ref Range Status  . WBC 11/08/2014 3.1* 3.6 - 11.0 K/uL Final  . RBC 11/08/2014 3.95  3.80 - 5.20 MIL/uL Final  . Hemoglobin 11/08/2014 12.5  12.0 - 16.0 g/dL Final  . HCT 11/08/2014 37.7  35.0 - 47.0 % Final  . MCV 11/08/2014 95.5  80.0 - 100.0 fL Final  . MCH 11/08/2014 31.8  26.0 - 34.0 pg Final  . MCHC 11/08/2014 33.3  32.0 - 36.0 g/dL Final  .  RDW 11/08/2014 13.1  11.5 - 14.5 % Final  . Platelets 11/08/2014 147* 150 - 440 K/uL Final  . Neutrophils Relative % 11/08/2014 54   Final  . Neutro Abs 11/08/2014 1.7  1.4 - 6.5 K/uL Final  . Lymphocytes Relative 11/08/2014 38   Final  . Lymphs Abs 11/08/2014 1.2  1.0 - 3.6 K/uL Final  . Monocytes Relative 11/08/2014 6   Final  . Monocytes Absolute 11/08/2014 0.2  0.2 - 0.9 K/uL Final  . Eosinophils Relative 11/08/2014 1   Final  . Eosinophils Absolute 11/08/2014 0.0  0 - 0.7 K/uL Final  . Basophils Relative 11/08/2014 1   Final  . Basophils Absolute 11/08/2014 0.0  0 - 0.1 K/uL Final  . Sodium 11/08/2014 137  135 - 145 mmol/L Final  . Potassium 11/08/2014 3.5  3.5 - 5.1 mmol/L Final  . Chloride 11/08/2014 105  101 - 111 mmol/L Final  . CO2 11/08/2014 28  22 - 32 mmol/L Final  . Glucose, Bld 11/08/2014 137* 65 - 99 mg/dL Final  . BUN 11/08/2014 12  6 - 20 mg/dL Final  . Creatinine, Ser 11/08/2014 0.85  0.44 - 1.00 mg/dL Final  . Calcium 11/08/2014 8.4* 8.9 - 10.3 mg/dL Final  . Total Protein 11/08/2014 7.5  6.5 - 8.1 g/dL Final  . Albumin 11/08/2014 4.0  3.5 - 5.0 g/dL Final  . AST 11/08/2014 27  15 - 41 U/L Final  . ALT 11/08/2014 13* 14 - 54 U/L Final  . Alkaline Phosphatase 11/08/2014 74  38 - 126 U/L Final  . Total Bilirubin 11/08/2014 0.7  0.3 - 1.2 mg/dL Final  . GFR calc non Af Amer 11/08/2014 >60  >60  mL/min Final  . GFR calc Af Amer 11/08/2014 >60  >60 mL/min Final   Comment: (NOTE) The eGFR has been calculated using the CKD EPI equation. This calculation has not been validated in all clinical situations. eGFR's persistently <60 mL/min signify possible Chronic Kidney Disease.   . Anion gap 11/08/2014 4* 5 - 15 Final        ASSESSMENT: 1.carcinoma of breast status post bilateral mastectomy . no evidence of recurrent disease  Cancer of colon status post abbreviated adjuvant therapy  No evidence of recurrent disease   all lab data has been reviewed  MEDICAL DECISION MAKING:  Patient generalized weakness etiology is not clear  Patient expressed understanding and was in agreement with this plan. She also understands that She can call clinic at any time with any questions, concerns, or complaints.    No matching staging information was found for the patient.  Forest Gleason, MD   11/08/2014 4:04 PM

## 2014-11-08 NOTE — Progress Notes (Signed)
Patient is here for a routine follow up.  Is feeling fatigued, has Hx of anemia.

## 2014-11-09 LAB — CEA: CEA: 7 ng/mL — ABNORMAL HIGH (ref 0.0–4.7)

## 2014-11-12 ENCOUNTER — Encounter: Payer: Self-pay | Admitting: Oncology

## 2014-11-16 DIAGNOSIS — E236 Other disorders of pituitary gland: Secondary | ICD-10-CM | POA: Diagnosis not present

## 2014-11-17 DIAGNOSIS — E236 Other disorders of pituitary gland: Secondary | ICD-10-CM | POA: Insufficient documentation

## 2015-05-08 ENCOUNTER — Encounter: Payer: Self-pay | Admitting: Oncology

## 2015-05-08 ENCOUNTER — Inpatient Hospital Stay: Payer: Medicare Other | Attending: Oncology

## 2015-05-08 ENCOUNTER — Inpatient Hospital Stay (HOSPITAL_BASED_OUTPATIENT_CLINIC_OR_DEPARTMENT_OTHER): Payer: Medicare Other | Admitting: Oncology

## 2015-05-08 VITALS — BP 112/79 | HR 91 | Temp 97.7°F | Wt 142.4 lb

## 2015-05-08 DIAGNOSIS — Z9013 Acquired absence of bilateral breasts and nipples: Secondary | ICD-10-CM | POA: Diagnosis not present

## 2015-05-08 DIAGNOSIS — M1990 Unspecified osteoarthritis, unspecified site: Secondary | ICD-10-CM | POA: Diagnosis not present

## 2015-05-08 DIAGNOSIS — Z9221 Personal history of antineoplastic chemotherapy: Secondary | ICD-10-CM | POA: Insufficient documentation

## 2015-05-08 DIAGNOSIS — Z853 Personal history of malignant neoplasm of breast: Secondary | ICD-10-CM | POA: Insufficient documentation

## 2015-05-08 DIAGNOSIS — C189 Malignant neoplasm of colon, unspecified: Secondary | ICD-10-CM

## 2015-05-08 DIAGNOSIS — Z87891 Personal history of nicotine dependence: Secondary | ICD-10-CM | POA: Diagnosis not present

## 2015-05-08 DIAGNOSIS — E785 Hyperlipidemia, unspecified: Secondary | ICD-10-CM | POA: Insufficient documentation

## 2015-05-08 DIAGNOSIS — Z85038 Personal history of other malignant neoplasm of large intestine: Secondary | ICD-10-CM | POA: Insufficient documentation

## 2015-05-08 DIAGNOSIS — Z79899 Other long term (current) drug therapy: Secondary | ICD-10-CM | POA: Insufficient documentation

## 2015-05-08 DIAGNOSIS — H409 Unspecified glaucoma: Secondary | ICD-10-CM | POA: Insufficient documentation

## 2015-05-08 LAB — CBC WITH DIFFERENTIAL/PLATELET
Basophils Absolute: 0 10*3/uL (ref 0–0.1)
Basophils Relative: 1 %
EOS ABS: 0 10*3/uL (ref 0–0.7)
EOS PCT: 2 %
HCT: 37.2 % (ref 35.0–47.0)
Hemoglobin: 12.6 g/dL (ref 12.0–16.0)
LYMPHS ABS: 1.1 10*3/uL (ref 1.0–3.6)
Lymphocytes Relative: 34 %
MCH: 32.1 pg (ref 26.0–34.0)
MCHC: 33.8 g/dL (ref 32.0–36.0)
MCV: 95 fL (ref 80.0–100.0)
MONO ABS: 0.2 10*3/uL (ref 0.2–0.9)
Monocytes Relative: 5 %
Neutro Abs: 1.9 10*3/uL (ref 1.4–6.5)
Neutrophils Relative %: 58 %
PLATELETS: 137 10*3/uL — AB (ref 150–440)
RBC: 3.92 MIL/uL (ref 3.80–5.20)
RDW: 13.3 % (ref 11.5–14.5)
WBC: 3.2 10*3/uL — AB (ref 3.6–11.0)

## 2015-05-08 LAB — COMPREHENSIVE METABOLIC PANEL
ALT: 14 U/L (ref 14–54)
ANION GAP: 5 (ref 5–15)
AST: 26 U/L (ref 15–41)
Albumin: 4 g/dL (ref 3.5–5.0)
Alkaline Phosphatase: 71 U/L (ref 38–126)
BUN: 15 mg/dL (ref 6–20)
CHLORIDE: 104 mmol/L (ref 101–111)
CO2: 29 mmol/L (ref 22–32)
Calcium: 9.1 mg/dL (ref 8.9–10.3)
Creatinine, Ser: 0.87 mg/dL (ref 0.44–1.00)
Glucose, Bld: 117 mg/dL — ABNORMAL HIGH (ref 65–99)
Potassium: 3.4 mmol/L — ABNORMAL LOW (ref 3.5–5.1)
SODIUM: 138 mmol/L (ref 135–145)
Total Bilirubin: 0.6 mg/dL (ref 0.3–1.2)
Total Protein: 7.7 g/dL (ref 6.5–8.1)

## 2015-05-09 ENCOUNTER — Inpatient Hospital Stay: Payer: Medicare PPO

## 2015-05-09 ENCOUNTER — Inpatient Hospital Stay: Payer: Medicare PPO | Admitting: Oncology

## 2015-05-09 LAB — CEA: CEA: 5.8 ng/mL — AB (ref 0.0–4.7)

## 2015-05-14 ENCOUNTER — Telehealth: Payer: Self-pay | Admitting: *Deleted

## 2015-05-14 ENCOUNTER — Encounter: Payer: Self-pay | Admitting: Oncology

## 2015-05-14 NOTE — Progress Notes (Signed)
[place] @ [facility] [phone]  Fax:([phone]) [place]: 23-Nov-1950  MR#: [id number]  GLO#:[id number]  Patient Care Team: Glendon Axe, MD as PCP - General (Internal Medicine)  CHIEF COMPLAINT:  Chief Complaint  Patient presents with  . Colon Cancer   Subjective: Chief Complaint/Diagnosis:   1. Adenocarcinoma of the breast. Cytoxan and Adriamycin completed in July of 2002. Herceptin and Taxotere in August 2002. 2. Status post bilateral mastectomy 3. Lymphedema on the left upper extremity 4. Anemia.  Neutropenia.  Microcytosis.  (October, 2014) Bone marrow aspiration and biopsy was negative for any myelodysplastic syndrome (October, 2014 Iron studies are suggestive fine deficiency anemia Bone marrow did not reveal any iron storage 5. Right-sided colon cancer status post hemicolectomy in December of 2014 T3, N1, M0 tumor stage III 6. Patient started on chemotherapy with FOLFOX PLUS or MINUS Celecoxib orally ACCORDING TO [facility] protocol 7. Patient elected to withdraw from the trial effective 02/25/13   INTERVAL HISTORY:  65 year old lady with history of carcinoma of breast and history of colon cancer came today further follow-up.  No chills.  No fever.   Patient is gradually getting better.  Has gained 5 pounds of weight.  Appetite is improving.  Patient is here for further follow-up regarding carcinoma of breast, carcinoma of colon, neutropenia and anemia Slightly elevated CEA which is stable Getting regular colonoscopy done REVIEW OF SYSTEMS:    general status: Patient is feeling weak and tired.  No change in a performance status.  No chills.  No fever.  has lost 4 more pounds of weight HEENT: Alopecia.  No evidence of stomatitis Lungs: No cough or shortness of breath Cardiac: No chest pain or paroxysmal nocturnal dyspnea GI: No nausea no vomiting no diarrhea no abdominal pain Skin: No rash Lower extremity no swelling Neurological system: No  tingling.  No numbness.  No other focal signs Musculoskeletal system no bony pains  Review of systems:  All other systems reviewed and found to be negative. As per HPI. Otherwise, a complete review of systems is negatve.  PAST MEDICAL HISTORY: Past Medical History  Diagnosis Date  . Arthritis   . Glaucoma   . Abdominal pain 2013  . Hyperlipidemia 20 years  . Shingles   . Kidney stone   . Breast cyst   . Bronchitis   . Bruises easily   . Pneumonia   . Blood in urine   . Difficulty urinating   . Anemia   . Swollen lymph nodes   . Hearing loss   . Bleeding nose   . Breast cancer, left breast ([place]) 2002    Left Mastectomy, chemo + Rad tx's.  . Colon cancer ([place]) 10/2012    Partial colon resection and chemo tx's.    PAST SURGICAL HISTORY: Past Surgical History  Procedure Laterality Date  . Mastectomy Bilateral 2002,2010  . Colonoscopy  2004  . Salpingo oophorectmy   1973  . Kidney stone removal     . Cesarean section    . Abdominal hysterectomy  1973  . Appendectomy  1969    FAMILY HISTORY Family History  Problem Relation Age of Onset  . Hypertension Mother   . Alzheimer's disease Mother   . Cancer Father     prostate  . Cancer Sister     breast  . Hypertension Sister   . Cancer Brother     stomach  . Hypertension Brother   . Sarcoidosis Other  positive family history.  . Cancer Sister     breast  . Cancer Sister     breast    ADVANCED DIRECTIVES:  Patient does have advance healthcare directive, Patient   does not desire to make any changes HEALTH MAINTENANCE: Social History  Substance Use Topics  . Smoking status: Former Smoker -- 1.00 packs/day for 20 years    Types: Cigarettes  . Smokeless tobacco: None  . Alcohol Use: No      Allergies  Allergen Reactions  . Sulfa Antibiotics Swelling  . Hydrocodone-Ibuprofen Other (See Comments)    Patient feels like there is a heavy wight on her chest  . Statins Other (See Comments)    Leg cramps  and groin pain  . Other Itching    Tree Nuts  . Tape Rash     Significant History/PMH:   Colon Cancer:    Breast Ca:    Glaucoma:    high cholesterol:    Lap Right Colectomy:    Bilateral Mastectomy:    Hysterectomy -: 1973  Preventive Screening:  Has patient had any of the following test? Mammography (1)   Last Mammography: 2010(1)   PFSH: Family History: Positive for breast cancer in sister.  Cancer of liver probably metastatic disease in other family member.  Also positive for diabetes and anemia in the family.  Social History: negative alcohol, positive tobacco  Smoker: Smoking cessation counseling performed  Additional Past Medical and Surgical History: Past Medical History:  Kidney stones, arthritis, vertebral fracture.      Past Surgical History: Hysterectomy in April 1973, Appendectomy in March 1969, Breast biopsy in 1986 which was benign, Mastectomy in March 2002.  Mastectomy in February 2010.    OBJECTIVE:  Filed Vitals:   05/08/15 1533  BP: 112/79  Pulse: 91  Temp: 97.7 F (36.5 C)     Body mass index is 26.04 kg/(m^2).    ECOG FS:1 - Symptomatic but completely ambulatory  PHYSICAL EXAM: General  status: Performance status is good.  Patient has not lost significant weight. Since last evaluation there is no significant change in the general status HEENT: No evidence of stomatitis. Sclera and conjunctivae :: No jaundice.   pale looking. Lungs: Air  entry equal on both sides.  No rhonchi.  No rales.  Cardiac: Heart sounds are normal.  No pericardial rub.  No murmur. Lymphatic system: Cervical, axillary, inguinal, lymph nodes not palpable GI: Abdomen is soft.liver and spleen not palpable.  No ascites.  Bowel sounds are normal.  No other palpable masses.  No tenderness . Lower extremity: No edema Neurological system: Higher functions, cranial nerves intact no evidence of peripheral neuropathy. Skin: No rash.  No ecchymosis.. No petechial hemorrhages   chest: Bilateral mastectomy no evidence of recurrent disease   LAB RESULTS:  CBC Latest Ref Rng 05/08/2015 11/08/2014  WBC 3.6 - 11.0 K/uL 3.2(L) 3.1(L)  Hemoglobin 12.0 - 16.0 g/dL 12.6 12.5  Hematocrit 35.0 - 47.0 % 37.2 37.7  Platelets 150 - 440 K/uL 137(L) 147(L)    Appointment on 05/08/2015  Component Date Value Ref Range Status  . WBC 05/08/2015 3.2* 3.6 - 11.0 K/uL Final  . RBC 05/08/2015 3.92  3.80 - 5.20 MIL/uL Final  . Hemoglobin 05/08/2015 12.6  12.0 - 16.0 g/dL Final  . HCT 05/08/2015 37.2  35.0 - 47.0 % Final  . MCV 05/08/2015 95.0  80.0 - 100.0 fL Final  . MCH 05/08/2015 32.1  26.0 - 34.0 pg Final  . MCHC  05/08/2015 33.8  32.0 - 36.0 g/dL Final  . RDW 05/08/2015 13.3  11.5 - 14.5 % Final  . Platelets 05/08/2015 137* 150 - 440 K/uL Final  . Neutrophils Relative % 05/08/2015 58   Final  . Neutro Abs 05/08/2015 1.9  1.4 - 6.5 K/uL Final  . Lymphocytes Relative 05/08/2015 34   Final  . Lymphs Abs 05/08/2015 1.1  1.0 - 3.6 K/uL Final  . Monocytes Relative 05/08/2015 5   Final  . Monocytes Absolute 05/08/2015 0.2  0.2 - 0.9 K/uL Final  . Eosinophils Relative 05/08/2015 2   Final  . Eosinophils Absolute 05/08/2015 0.0  0 - 0.7 K/uL Final  . Basophils Relative 05/08/2015 1   Final  . Basophils Absolute 05/08/2015 0.0  0 - 0.1 K/uL Final  . Sodium 05/08/2015 138  135 - 145 mmol/L Final  . Potassium 05/08/2015 3.4* 3.5 - 5.1 mmol/L Final  . Chloride 05/08/2015 104  101 - 111 mmol/L Final  . CO2 05/08/2015 29  22 - 32 mmol/L Final  . Glucose, Bld 05/08/2015 117* 65 - 99 mg/dL Final  . BUN 05/08/2015 15  6 - 20 mg/dL Final  . Creatinine, Ser 05/08/2015 0.87  0.44 - 1.00 mg/dL Final  . Calcium 05/08/2015 9.1  8.9 - 10.3 mg/dL Final  . Total Protein 05/08/2015 7.7  6.5 - 8.1 g/dL Final  . Albumin 05/08/2015 4.0  3.5 - 5.0 g/dL Final  . AST 05/08/2015 26  15 - 41 U/L Final  . ALT 05/08/2015 14  14 - 54 U/L Final  . Alkaline Phosphatase 05/08/2015 71  38 - 126 U/L Final  .  Total Bilirubin 05/08/2015 0.6  0.3 - 1.2 mg/dL Final  . GFR calc non Af Amer 05/08/2015 >60  >60 mL/min Final  . GFR calc Af Amer 05/08/2015 >60  >60 mL/min Final   Comment: (NOTE) The eGFR has been calculated using the CKD EPI equation. This calculation has not been validated in all clinical situations. eGFR's persistently <60 mL/min signify possible Chronic Kidney Disease.   . Anion gap 05/08/2015 5  5 - 15 Final  . CEA 05/08/2015 5.8* 0.0 - 4.7 ng/mL Final   Comment: (NOTE)       Roche ECLIA methodology       Nonsmokers  <3.9                                     Smokers     <5.6 Performed At: [facility] [place], [place] [id number] Lindon Romp MD FT:[id number]         ASSESSMENT: 1.carcinoma of breast status post bilateral mastectomy . no evidence of recurrent disease   Cancer of colon status post abbreviated adjuvant therapy  No evidence of recurrent disease   all lab data has been reviewed CEA is 5.8 (has decreased from the 6 month ago when it was 7.0 Repeat thyroid assessment a TSH and T4 Follow-up in 6 months for colon cancer and breast cancer.  In my absence patient will be evaluated by my associate.  Patient is fully aware of my retirement plan .    No matching staging information was found for the patient.  Forest Gleason, MD   05/14/2015 10:09 AM

## 2015-05-14 NOTE — Telephone Encounter (Signed)
Received referral for low dose lung cancer screening CT scan. Voicemail left at phone number listed in EMR for patient to call me back to facilitate scheduling scan.  

## 2015-05-15 ENCOUNTER — Other Ambulatory Visit: Payer: Self-pay | Admitting: *Deleted

## 2015-05-15 DIAGNOSIS — R5382 Chronic fatigue, unspecified: Secondary | ICD-10-CM

## 2015-05-15 DIAGNOSIS — C189 Malignant neoplasm of colon, unspecified: Secondary | ICD-10-CM

## 2015-05-28 ENCOUNTER — Telehealth: Payer: Self-pay | Admitting: *Deleted

## 2015-05-28 NOTE — Telephone Encounter (Signed)
Contacted patient regarding scheduling of lung cancer screening scan. Patient is tentatively scheduled for 06/04/15 at [facility] pending insurance approval from business office.

## 2015-06-07 ENCOUNTER — Other Ambulatory Visit: Payer: Self-pay | Admitting: Family Medicine

## 2015-06-07 DIAGNOSIS — Z87891 Personal history of nicotine dependence: Secondary | ICD-10-CM | POA: Insufficient documentation

## 2015-06-07 DIAGNOSIS — G629 Polyneuropathy, unspecified: Secondary | ICD-10-CM

## 2015-06-07 DIAGNOSIS — G608 Other hereditary and idiopathic neuropathies: Secondary | ICD-10-CM | POA: Insufficient documentation

## 2015-06-11 ENCOUNTER — Inpatient Hospital Stay: Payer: Medicare Other | Attending: Family Medicine | Admitting: Family Medicine

## 2015-06-11 ENCOUNTER — Ambulatory Visit
Admission: RE | Admit: 2015-06-11 | Discharge: 2015-06-11 | Disposition: A | Discharge status: Home / Self Care | Payer: Medicare Other | Source: Ambulatory Visit | Attending: Family Medicine | Admitting: Family Medicine

## 2015-06-11 ENCOUNTER — Encounter: Payer: Self-pay | Admitting: Family Medicine

## 2015-06-11 DIAGNOSIS — Z122 Encounter for screening for malignant neoplasm of respiratory organs: Secondary | ICD-10-CM | POA: Diagnosis not present

## 2015-06-11 DIAGNOSIS — Z87891 Personal history of nicotine dependence: Secondary | ICD-10-CM

## 2015-06-11 NOTE — Progress Notes (Signed)
In accordance with CMS guidelines, patient has meet eligibility criteria including age, absence of signs or symptoms of lung cancer, the specific calculation of cigarette smoking pack-years was 52.5 years and is a former smoker, having quit in 2003.   A shared decision-making session was conducted prior to the performance of CT scan. This includes one or more decision aids, includes benefits and harms of screening, follow-up diagnostic testing, over-diagnosis, false positive rate, and total radiation exposure.  Counseling on the importance of adherence to annual lung cancer LDCT screening, impact of co-morbidities, and ability or willingness to undergo diagnosis and treatment is imperative for compliance of the program.  Counseling on the importance of continued smoking cessation for former smokers; the importance of smoking cessation for current smokers and information about tobacco cessation interventions have been given to patient including the [place] at [facility], 1800 quit [place], as well as [place] specific smoking cessation programs.  Written order for lung cancer screening with LDCT has been given to the patient and any and all questions have been answered to the best of my abilities.   Yearly follow up will be scheduled by Burgess Estelle, Thoracic Navigator.

## 2015-06-13 ENCOUNTER — Ambulatory Visit: Payer: Medicare PPO | Admitting: Oncology

## 2015-06-13 ENCOUNTER — Other Ambulatory Visit: Payer: Medicare PPO

## 2015-06-14 ENCOUNTER — Telehealth: Payer: Self-pay | Admitting: *Deleted

## 2015-06-14 NOTE — Telephone Encounter (Signed)
Notified patient of LDCT lung cancer screening results with recommendation for 12 month follow up imaging. Also notified of incidental finding noted below. Patient verbalizes understanding.   IMPRESSION: Lung-RADS Category 2, benign appearance or behavior. Continue annual screening with low-dose chest CT without contrast in 12 months.

## 2015-07-09 NOTE — Discharge Instructions (Signed)

## 2015-07-11 ENCOUNTER — Ambulatory Visit
Admission: RE | Admit: 2015-07-11 | Discharge: 2015-07-11 | Disposition: A | Discharge status: Home / Self Care | Payer: Medicare Other | Source: Ambulatory Visit | Attending: Ophthalmology | Admitting: Ophthalmology

## 2015-07-11 ENCOUNTER — Encounter
Admission: RE | Disposition: A | Discharge status: Home / Self Care | Payer: Self-pay | Source: Ambulatory Visit | Attending: Ophthalmology

## 2015-07-11 ENCOUNTER — Encounter: Payer: Self-pay | Admitting: *Deleted

## 2015-07-11 ENCOUNTER — Ambulatory Visit: Payer: Medicare Other | Admitting: Anesthesiology

## 2015-07-11 DIAGNOSIS — H2512 Age-related nuclear cataract, left eye: Secondary | ICD-10-CM | POA: Diagnosis not present

## 2015-07-11 DIAGNOSIS — G709 Myoneural disorder, unspecified: Secondary | ICD-10-CM | POA: Diagnosis not present

## 2015-07-11 DIAGNOSIS — Z853 Personal history of malignant neoplasm of breast: Secondary | ICD-10-CM | POA: Insufficient documentation

## 2015-07-11 DIAGNOSIS — Z85038 Personal history of other malignant neoplasm of large intestine: Secondary | ICD-10-CM | POA: Diagnosis not present

## 2015-07-11 DIAGNOSIS — Z87891 Personal history of nicotine dependence: Secondary | ICD-10-CM | POA: Diagnosis not present

## 2015-07-11 DIAGNOSIS — M1990 Unspecified osteoarthritis, unspecified site: Secondary | ICD-10-CM | POA: Insufficient documentation

## 2015-07-11 HISTORY — PX: CATARACT EXTRACTION W/PHACO: SHX586

## 2015-07-11 HISTORY — DX: Diverticulitis of intestine, part unspecified, without perforation or abscess without bleeding: K57.92

## 2015-07-11 HISTORY — DX: Myoneural disorder, unspecified: G70.9

## 2015-07-11 SURGERY — PHACOEMULSIFICATION, CATARACT, WITH IOL INSERTION
Anesthesia: Monitor Anesthesia Care | Site: Eye | Laterality: Left | Wound class: Clean

## 2015-07-11 MED ORDER — POVIDONE-IODINE 5 % OP SOLN
1.0000 "application " | OPHTHALMIC | Status: DC | PRN
  Administered 2015-07-11: 1 via OPHTHALMIC

## 2015-07-11 MED ORDER — CARBACHOL 0.01 % IO SOLN
INTRAOCULAR | Status: DC | PRN
Start: 2015-07-11 — End: 2015-07-11
  Administered 2015-07-11: 0.5 mL via INTRAOCULAR

## 2015-07-11 MED ORDER — EPINEPHRINE HCL 1 MG/ML IJ SOLN
INTRAMUSCULAR | Status: DC | PRN
  Administered 2015-07-11: 58 mL via OPHTHALMIC

## 2015-07-11 MED ORDER — FENTANYL CITRATE (PF) 100 MCG/2ML IJ SOLN
INTRAMUSCULAR | Status: DC | PRN
  Administered 2015-07-11: 50 ug via INTRAVENOUS

## 2015-07-11 MED ORDER — ARMC OPHTHALMIC DILATING GEL
1.0000 "application " | OPHTHALMIC | Status: DC | PRN
  Administered 2015-07-11 (×2): 1 via OPHTHALMIC

## 2015-07-11 MED ORDER — LIDOCAINE HCL (PF) 4 % IJ SOLN
INTRAOCULAR | Status: DC | PRN
  Administered 2015-07-11: 1 mL via OPHTHALMIC

## 2015-07-11 MED ORDER — MIDAZOLAM HCL 2 MG/2ML IJ SOLN
INTRAMUSCULAR | Status: DC | PRN
  Administered 2015-07-11: 2 mg via INTRAVENOUS

## 2015-07-11 MED ORDER — NA HYALUR & NA CHOND-NA HYALUR 0.4-0.35 ML IO KIT
PACK | INTRAOCULAR | Status: DC | PRN
  Administered 2015-07-11: 1 mL via INTRAOCULAR

## 2015-07-11 MED ORDER — TETRACAINE HCL 0.5 % OP SOLN
1.0000 [drp] | OPHTHALMIC | Status: DC | PRN
  Administered 2015-07-11: 1 [drp] via OPHTHALMIC

## 2015-07-11 MED ORDER — CEFUROXIME OPHTHALMIC INJECTION 1 MG/0.1 ML
INJECTION | OPHTHALMIC | Status: DC | PRN
  Administered 2015-07-11: 0.1 mL via INTRACAMERAL

## 2015-07-11 MED ORDER — TIMOLOL MALEATE 0.5 % OP SOLN: OPHTHALMIC | Status: DC | PRN

## 2015-07-11 MED ORDER — BRIMONIDINE TARTRATE 0.2 % OP SOLN
OPHTHALMIC | Status: DC | PRN
  Administered 2015-07-11: 1 [drp] via OPHTHALMIC

## 2015-07-11 SURGICAL SUPPLY — 27 items
CANNULA ANT/CHMB 27G (MISCELLANEOUS) ×1 IMPLANT
CANNULA ANT/CHMB 27GA (MISCELLANEOUS) ×2 IMPLANT
CARTRIDGE ABBOTT (MISCELLANEOUS) IMPLANT
GLOVE SURG LX 7.5 STRW (GLOVE) ×1
GLOVE SURG LX STRL 7.5 STRW (GLOVE) ×1 IMPLANT
GLOVE SURG TRIUMPH 8.0 PF LTX (GLOVE) ×2 IMPLANT
GOWN STRL REUS W/ TWL LRG LVL3 (GOWN DISPOSABLE) ×2 IMPLANT
GOWN STRL REUS W/TWL LRG LVL3 (GOWN DISPOSABLE) ×4
LENS IOL TECNIS ITEC 22.0 (Intraocular Lens) ×1 IMPLANT
MARKER SKIN DUAL TIP RULER LAB (MISCELLANEOUS) ×2 IMPLANT
NDL FILTER BLUNT 18X1 1/2 (NEEDLE) ×1 IMPLANT
NDL RETROBULBAR .5 NSTRL (NEEDLE) IMPLANT
NEEDLE FILTER BLUNT 18X 1/2SAF (NEEDLE) ×1
NEEDLE FILTER BLUNT 18X1 1/2 (NEEDLE) ×1 IMPLANT
PACK CATARACT BRASINGTON (MISCELLANEOUS) ×2 IMPLANT
PACK EYE AFTER SURG (MISCELLANEOUS) ×2 IMPLANT
PACK OPTHALMIC (MISCELLANEOUS) ×2 IMPLANT
RING MALYGIN 7.0 (MISCELLANEOUS) IMPLANT
SUT ETHILON 10-0 CS-B-6CS-B-6 (SUTURE)
SUT VICRYL  9 0 (SUTURE)
SUT VICRYL 9 0 (SUTURE) IMPLANT
SUTURE EHLN 10-0 CS-B-6CS-B-6 (SUTURE) IMPLANT
SYR 3ML LL SCALE MARK (SYRINGE) ×2 IMPLANT
SYR 5ML LL (SYRINGE) ×2 IMPLANT
SYR TB 1ML LUER SLIP (SYRINGE) ×2 IMPLANT
WATER STERILE IRR 250ML POUR (IV SOLUTION) ×2 IMPLANT
WIPE NON LINTING 3.25X3.25 (MISCELLANEOUS) ×2 IMPLANT

## 2015-07-11 NOTE — Op Note (Signed)
OPERATIVE NOTE  Amy Bird [id number] 07/11/2015   PREOPERATIVE DIAGNOSIS:  Nuclear sclerotic cataract left eye. H25.12   POSTOPERATIVE DIAGNOSIS:    Nuclear sclerotic cataract left eye.     PROCEDURE:  Phacoemusification with posterior chamber intraocular lens placement of the left eye   LENS:   Implant Name Type Inv. Item Serial No. Manufacturer Lot No. LRB No. Used  LENS IOL DIOP 22.0 - [id number] Intraocular Lens LENS IOL DIOP 22.0 [id number]   Left 1        ULTRASOUND TIME: 11  % of 1 minutes 6 seconds, CDE 7.5  SURGEON:  Wyonia Hough, MD   ANESTHESIA:  Topical with tetracaine drops and 2% Xylocaine jelly, augmented with 1% preservative-free intracameral lidocaine.    COMPLICATIONS:  None.   DESCRIPTION OF PROCEDURE:  The patient was identified in the holding room and transported to the operating room and placed in the supine position under the operating microscope.  The left eye was identified as the operative eye and it was prepped and draped in the usual sterile ophthalmic fashion.   A 1 millimeter clear-corneal paracentesis was made at the 1:30 position.  0.5 ml of preservative-free 1% lidocaine was injected into the anterior chamber.  The anterior chamber was filled with Viscoat viscoelastic.  A 2.4 millimeter keratome was used to make a near-clear corneal incision at the 10:30 position.  .  A curvilinear capsulorrhexis was made with a cystotome and capsulorrhexis forceps.  Balanced salt solution was used to hydrodissect and hydrodelineate the nucleus.   Phacoemulsification was then used in stop and chop fashion to remove the lens nucleus and epinucleus.  The remaining cortex was then removed using the irrigation and aspiration handpiece. Provisc was then placed into the capsular bag to distend it for lens placement.  A lens was then injected into the capsular bag.  The remaining viscoelastic was aspirated.   Wounds were hydrated with balanced salt  solution.  The anterior chamber was inflated to a physiologic pressure with balanced salt solution. Miostat was placed into the anterior chamber.  No wound leaks were noted. Cefuroxime 0.1 ml of a 10mg /ml solution was injected into the anterior chamber for a dose of 1 mg of intracameral antibiotic at the completion of the case.  The patient was taken to the recovery room in stable condition without complications of anesthesia or surgery.  Amy Bird 07/11/2015, 9:44 AM

## 2015-07-11 NOTE — H&P (Signed)
  The History and Physical notes are on paper, have been signed, and are to be scanned. The patient remains stable and unchanged from the H&P.   Previous H&P reviewed, patient examined, and there are no changes.  Amy Bird 07/11/2015 8:54 AM

## 2015-07-11 NOTE — Anesthesia Procedure Notes (Signed)
Procedure Name: MAC Performed by: Bright Spielmann Pre-anesthesia Checklist: Patient identified, Emergency Drugs available, Suction available, Timeout performed and Patient being monitored Patient Re-evaluated:Patient Re-evaluated prior to inductionOxygen Delivery Method: Nasal cannula Placement Confirmation: positive ETCO2     

## 2015-07-11 NOTE — Anesthesia Postprocedure Evaluation (Signed)
Anesthesia Post Note  Patient: Amy Bird  Procedure(s) Performed: Procedure(s) (LRB): CATARACT EXTRACTION PHACO AND INTRAOCULAR LENS PLACEMENT (IOC) left eye (Left)  Patient location during evaluation: PACU Anesthesia Type: MAC Level of consciousness: awake and alert and oriented Pain management: pain level controlled Vital Signs Assessment: post-procedure vital signs reviewed and stable Respiratory status: spontaneous breathing and nonlabored ventilation Cardiovascular status: stable Postop Assessment: no signs of nausea or vomiting and adequate PO intake Anesthetic complications: no    Estill Batten

## 2015-07-11 NOTE — Transfer of Care (Signed)
Immediate Anesthesia Transfer of Care Note  Patient: Amy Bird  Procedure(s) Performed: Procedure(s): CATARACT EXTRACTION PHACO AND INTRAOCULAR LENS PLACEMENT (IOC) left eye (Left)  Patient Location: PACU  Anesthesia Type: MAC  Level of Consciousness: awake, alert  and patient cooperative  Airway and Oxygen Therapy: Patient Spontanous Breathing and Patient connected to supplemental oxygen  Post-op Assessment: Post-op Vital signs reviewed, Patient's Cardiovascular Status Stable, Respiratory Function Stable, Patent Airway and No signs of Nausea or vomiting  Post-op Vital Signs: Reviewed and stable  Complications: No apparent anesthesia complications

## 2015-07-11 NOTE — Anesthesia Preprocedure Evaluation (Signed)
Anesthesia Evaluation  Patient identified by MRN, date of birth, ID band Patient awake    Reviewed: Allergy & Precautions, NPO status , Patient's Chart, lab work & pertinent test results  Airway Mallampati: I  TM Distance: >3 FB Neck ROM: Full    Dental no notable dental hx.    Pulmonary former smoker,    Pulmonary exam normal        Cardiovascular negative cardio ROS Normal cardiovascular exam     Neuro/Psych  Neuromuscular disease negative psych ROS   GI/Hepatic negative GI ROS, Neg liver ROS,   Endo/Other    Renal/GU negative Renal ROS     Musculoskeletal  (+) Arthritis , Osteoarthritis,    Abdominal   Peds  Hematology H/O breast Ca, H/O colon Ca   Anesthesia Other Findings   Reproductive/Obstetrics                             Anesthesia Physical Anesthesia Plan  ASA: II  Anesthesia Plan: MAC   Post-op Pain Management:    Induction: Intravenous  Airway Management Planned:   Additional Equipment:   Intra-op Plan:   Post-operative Plan:   Informed Consent: I have reviewed the patients History and Physical, chart, labs and discussed the procedure including the risks, benefits and alternatives for the proposed anesthesia with the patient or authorized representative who has indicated his/her understanding and acceptance.     Plan Discussed with: CRNA  Anesthesia Plan Comments:         Anesthesia Quick Evaluation

## 2015-07-12 ENCOUNTER — Encounter: Payer: Self-pay | Admitting: Ophthalmology

## 2015-07-17 ENCOUNTER — Encounter: Payer: Self-pay | Admitting: Ophthalmology

## 2015-09-16 DIAGNOSIS — M722 Plantar fascial fibromatosis: Secondary | ICD-10-CM | POA: Insufficient documentation

## 2015-09-16 DIAGNOSIS — M25471 Effusion, right ankle: Secondary | ICD-10-CM | POA: Insufficient documentation

## 2015-09-16 DIAGNOSIS — M25472 Effusion, left ankle: Secondary | ICD-10-CM

## 2015-09-17 ENCOUNTER — Other Ambulatory Visit: Payer: Self-pay | Admitting: Hematology and Oncology

## 2015-09-17 ENCOUNTER — Other Ambulatory Visit: Payer: Self-pay | Admitting: *Deleted

## 2015-09-17 ENCOUNTER — Encounter (INDEPENDENT_AMBULATORY_CARE_PROVIDER_SITE_OTHER): Payer: Self-pay

## 2015-09-17 ENCOUNTER — Inpatient Hospital Stay: Payer: Medicare Other | Attending: Hematology and Oncology | Admitting: Hematology and Oncology

## 2015-09-17 ENCOUNTER — Encounter: Payer: Self-pay | Admitting: Hematology and Oncology

## 2015-09-17 ENCOUNTER — Inpatient Hospital Stay: Payer: Medicare Other

## 2015-09-17 VITALS — BP 94/58 | HR 87 | Temp 96.9°F | Resp 18 | Wt 137.9 lb

## 2015-09-17 DIAGNOSIS — Z9011 Acquired absence of right breast and nipple: Secondary | ICD-10-CM

## 2015-09-17 DIAGNOSIS — C182 Malignant neoplasm of ascending colon: Secondary | ICD-10-CM

## 2015-09-17 DIAGNOSIS — E538 Deficiency of other specified B group vitamins: Secondary | ICD-10-CM

## 2015-09-17 DIAGNOSIS — Z87442 Personal history of urinary calculi: Secondary | ICD-10-CM | POA: Insufficient documentation

## 2015-09-17 DIAGNOSIS — E785 Hyperlipidemia, unspecified: Secondary | ICD-10-CM | POA: Diagnosis not present

## 2015-09-17 DIAGNOSIS — Z85038 Personal history of other malignant neoplasm of large intestine: Secondary | ICD-10-CM | POA: Insufficient documentation

## 2015-09-17 DIAGNOSIS — Z170 Estrogen receptor positive status [ER+]: Secondary | ICD-10-CM | POA: Insufficient documentation

## 2015-09-17 DIAGNOSIS — R600 Localized edema: Secondary | ICD-10-CM

## 2015-09-17 DIAGNOSIS — F17210 Nicotine dependence, cigarettes, uncomplicated: Secondary | ICD-10-CM

## 2015-09-17 DIAGNOSIS — D509 Iron deficiency anemia, unspecified: Secondary | ICD-10-CM

## 2015-09-17 DIAGNOSIS — Z9221 Personal history of antineoplastic chemotherapy: Secondary | ICD-10-CM | POA: Diagnosis not present

## 2015-09-17 DIAGNOSIS — M1990 Unspecified osteoarthritis, unspecified site: Secondary | ICD-10-CM | POA: Insufficient documentation

## 2015-09-17 DIAGNOSIS — H409 Unspecified glaucoma: Secondary | ICD-10-CM | POA: Diagnosis not present

## 2015-09-17 DIAGNOSIS — C50912 Malignant neoplasm of unspecified site of left female breast: Secondary | ICD-10-CM

## 2015-09-17 DIAGNOSIS — Z853 Personal history of malignant neoplasm of breast: Secondary | ICD-10-CM | POA: Insufficient documentation

## 2015-09-17 DIAGNOSIS — D696 Thrombocytopenia, unspecified: Secondary | ICD-10-CM

## 2015-09-17 DIAGNOSIS — D72819 Decreased white blood cell count, unspecified: Secondary | ICD-10-CM

## 2015-09-17 DIAGNOSIS — Z79899 Other long term (current) drug therapy: Secondary | ICD-10-CM | POA: Diagnosis not present

## 2015-09-17 LAB — CBC WITH DIFFERENTIAL/PLATELET
Basophils Absolute: 0 10*3/uL (ref 0–0.1)
Basophils Relative: 1 %
Eosinophils Absolute: 0 10*3/uL (ref 0–0.7)
Eosinophils Relative: 1 %
HCT: 39.6 % (ref 35.0–47.0)
Hemoglobin: 13.3 g/dL (ref 12.0–16.0)
Lymphocytes Relative: 35 %
Lymphs Abs: 1.1 10*3/uL (ref 1.0–3.6)
MCH: 32.4 pg (ref 26.0–34.0)
MCHC: 33.7 g/dL (ref 32.0–36.0)
MCV: 96 fL (ref 80.0–100.0)
Monocytes Absolute: 0.2 10*3/uL (ref 0.2–0.9)
Monocytes Relative: 8 %
Neutro Abs: 1.7 10*3/uL (ref 1.4–6.5)
Neutrophils Relative %: 55 %
Platelets: 128 10*3/uL — ABNORMAL LOW (ref 150–440)
RBC: 4.12 MIL/uL (ref 3.80–5.20)
RDW: 13.3 % (ref 11.5–14.5)
WBC: 3 10*3/uL — ABNORMAL LOW (ref 3.6–11.0)

## 2015-09-17 LAB — COMPREHENSIVE METABOLIC PANEL
ALT: 12 U/L — ABNORMAL LOW (ref 14–54)
AST: 24 U/L (ref 15–41)
Albumin: 4 g/dL (ref 3.5–5.0)
Alkaline Phosphatase: 77 U/L (ref 38–126)
Anion gap: 7 (ref 5–15)
BUN: 12 mg/dL (ref 6–20)
CO2: 27 mmol/L (ref 22–32)
Calcium: 9.2 mg/dL (ref 8.9–10.3)
Chloride: 104 mmol/L (ref 101–111)
Creatinine, Ser: 0.8 mg/dL (ref 0.44–1.00)
GFR calc Af Amer: >60 mL/min (ref >60)
GFR calc non Af Amer: >60 mL/min (ref >60)
Glucose, Bld: 95 mg/dL (ref 65–99)
Potassium: 3.8 mmol/L (ref 3.5–5.1)
Sodium: 138 mmol/L (ref 135–145)
Total Bilirubin: 0.6 mg/dL (ref 0.3–1.2)
Total Protein: 7.9 g/dL (ref 6.5–8.1)

## 2015-09-17 LAB — FOLATE: Folate: 17 ng/mL (ref >5.9)

## 2015-09-17 LAB — PROTIME-INR
INR: 0.82
Prothrombin Time: 11.3 seconds — ABNORMAL LOW (ref 11.4–15.2)

## 2015-09-17 LAB — T4, FREE: Free T4: 0.91 ng/dL (ref 0.61–1.12)

## 2015-09-17 LAB — TSH: TSH: 1.011 u[IU]/mL (ref 0.350–4.500)

## 2015-09-17 LAB — VITAMIN B12: Vitamin B-12: 266 pg/mL (ref 180–914)

## 2015-09-17 LAB — FERRITIN: Ferritin: 20 ng/mL (ref 11–307)

## 2015-09-17 NOTE — Progress Notes (Signed)
[facility] day:  09/17/2015  Chief Complaint: Amy Bird is a 64 y.o. female with a history of left breast cancer (2002), right sided colon cancer (2014), and mild pancytopenia who is seen for reassessment.  HPI:  The patient was diagnosed with left breast cancer in 2002 after presenting with a palpable mass.  She underwent left mastectomy in 03/2000 (no pathology available).  She stated that there was more than 1 tumor.  She received Adriamycin and Cytoxan [facility]) which completed in 07/2000.  She then received Herceptin and Taxotere in 08/2000.  She did not receive hormonal therapy.  She underwent right sided mastectomy in 02/2008.  CA27.29 was 40.9 on 06/06/2010, 42.6 on 02/04/2011, 35.3 on 03/31/2012, and 38.1 on 11/09/2012.  She has had anemia and neutropenia since 10/2012.  Bone marrow aspiration and biopsy in 10/2012 was negative for any myelodysplastic syndrome.  Bone marrow revealed no iron storage  Patient presented with rectal bleeding.  She was diagnosed with colon cancer in 12/2012.  She underwent right hemicolectomy by Dr. Rochel Brome on 12/24/2012.  Pathology revealed stage III (T3N1M0) disease.  She was treated on CALGB protocol with FOLFOX chemotherapy +/- celecoxib.  She withdrew from the study in 02/25/2013.  She described trouble with counts.  She received 6 cycles of FOLFOX (01/28/2013 - 04/29/2013).   She had her last colonoscopy by Dr. Candace Cruise in 2015. Next colonoscopy is in 3 years (2018)  CEA was 3.8 on 12/09/2012, 6.3 on 01/27/2013, 10.6 on 05/26/2013, 5.9 on 09/22/2013, 7.1 on 05/10/2014, 7.0 on 11/08/2014, and 5.8 on 05/08/2015.  She was last seen by Dr. Oliva Bustard on 05/14/2015.  At that time, she was doing well without recurrent disease.  Symptomatically, she is doing pretty good.  She felt tired then got better.  She had eye surgery 06/2015.  She felt tired again in 07/2015.  She describes left leg swelling x 1 month.   She has  neuropathy and plantar fasciitis and limping when walking.  Ankle swelling is less when she is off of it.   She has lost 5 pounds.  She had an echo recently in 2017.    Past Medical History:  Diagnosis Date  . Abdominal pain 2013  . Anemia   . Arthritis   . Bleeding nose   . Blood in urine   . Breast cancer, left breast ([place]) 2002   Left Mastectomy, chemo + Rad tx's.  . Breast cyst   . Bronchitis   . Bruises easily   . Colon cancer ([place]) 10/2012   Partial colon resection and chemo tx's.  . Colon cancer ([place])   . Difficulty urinating   . Diverticulitis   . Glaucoma   . Hearing loss   . Hyperlipidemia 20 years  . Kidney stone   . Neuromuscular disorder ([place])    neuropathy  feet and hands from chemo tx  . Pneumonia   . Shingles   . Swollen lymph nodes     Past Surgical History:  Procedure Laterality Date  . ABDOMINAL HYSTERECTOMY  1973  . APPENDECTOMY  1969  . CATARACT EXTRACTION W/PHACO Left 07/11/2015   Procedure: CATARACT EXTRACTION PHACO AND INTRAOCULAR LENS PLACEMENT ([place]) left eye;  Surgeon: Leandrew Koyanagi, MD;  Location: [place];  Service: Ophthalmology;  Laterality: Left;  . CESAREAN SECTION    . COLON RESECTION    . COLONOSCOPY  2004  . kidney stone removal     . MASTECTOMY Bilateral  2542,7062  . salpingo oophorectmy   1973    Family History  Problem Relation Age of Onset  . Hypertension Mother   . Alzheimer's disease Mother   . Cancer Father     prostate  . Cancer Sister     breast  . Hypertension Sister   . Cancer Brother     stomach  . Hypertension Brother   . Sarcoidosis Other     positive family history.  . Cancer Sister     breast  . Cancer Sister     breast    Social History:  reports that she has quit smoking. Her smoking use included Cigarettes. She has a 52.50 pack-year smoking history. She does not have any smokeless tobacco history on file. She reports that she does not drink alcohol or use drugs.  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  She quit smoking in 2003.  She lives in [place].  The patient is alone today.  Allergies:  Allergies  Allergen Reactions  . Sulfa Antibiotics Swelling  . Hydrocodone-Ibuprofen Other (See Comments)    Patient feels like there is a heavy wight on her chest  . Statins Other (See Comments)    Leg cramps and groin pain  . Other Itching    Tree Nuts  . Tape Rash    Current Medications: Current Outpatient Prescriptions  Medication Sig Dispense Refill  . Calcium Carbonate-Vit D-Min (CALCIUM 1200 PO) Take 1 tablet by mouth daily. Reported on 07/09/2015    . Difluprednate (DUREZOL) 0.05 % EMUL Apply to eye.    . latanoprost (XALATAN) 0.005 % ophthalmic solution Place 1 drop into both eyes at bedtime.    . nepafenac (ILEVRO) 0.3 % ophthalmic suspension Place 3 drops into the left eye daily.    . vitamin C (ASCORBIC ACID) 500 MG tablet Take 500 mg by mouth daily.     No current facility-administered medications for this visit.     Review of Systems:  GENERAL:  Feels good.  No fevers or sweats.  Weight loss of 5 pounds. PERFORMANCE STATUS (ECOG):  1 HEENT:  Vision better.  No visual changes, runny nose, sore throat, mouth sores or tenderness. Lungs: No shortness of breath or cough.  No hemoptysis. Cardiac:  No chest pain, palpitations, orthopnea, or PND. GI:  Constipation.  No nausea, vomiting, diarrhea, constipation, melena or hematochezia. GU:  No urgency, frequency, dysuria, or hematuria. Musculoskeletal:  No back pain.  No joint pain.  No muscle tenderness. Extremities:  Left leg swelling. Skin:  No rashes or skin changes. Neuro:  No headache, numbness or weakness, balance or coordination issues. Endocrine:  No diabetes, thyroid issues, hot flashes or night sweats. Psych:  No mood changes, depression or anxiety. Pain:  No focal pain. Review of systems:  All other systems reviewed and found to be negative.  Physical Exam:  Blood pressure (!) 94/58, pulse  87, temperature (!) 96.9 F (36.1 C), temperature source Tympanic, resp. rate 18, weight 137 lb 14.4 oz (62.5 kg). GENERAL:  Well developed, well nourished, sitting comfortably in the exam room in no acute distress. MENTAL STATUS:  Alert and oriented to person, place and time. HEAD:  Brown jaw length hair.  Normocephalic, atraumatic, face symmetric, no Cushingoid features. EYES:  Glasses.  Brown eyes.  Pupils equal round and reactive to light and accomodation.  No conjunctivitis or scleral icterus. ENT:  Oropharynx clear without lesion.  Tongue normal. Mucous membranes moist.  RESPIRATORY:  Clear to auscultation without  rales, wheezes or rhonchi. CARDIOVASCULAR:  Regular rate and rhythm without murmur, rub or gallop. ABDOMEN:  Soft, non-tender, with active bowel sounds, and no hepatosplenomegaly.  No masses. SKIN:  No rashes, ulcers or lesions. EXTREMITIES: Left lower extremity edema.  No skin discoloration or tenderness.  No palpable cords. LYMPH NODES: No palpable cervical, supraclavicular, axillary or inguinal adenopathy  NEUROLOGICAL: Unremarkable. Stumble a little bit. PSYCH:  Appropriate.   No visits with results within 3 Day(s) from this visit.  Latest known visit with results is:  Appointment on 05/08/2015  Component Date Value Ref Range Status  . WBC 05/08/2015 3.2* 3.6 - 11.0 K/uL Final  . RBC 05/08/2015 3.92  3.80 - 5.20 MIL/uL Final  . Hemoglobin 05/08/2015 12.6  12.0 - 16.0 g/dL Final  . HCT 05/08/2015 37.2  35.0 - 47.0 % Final  . MCV 05/08/2015 95.0  80.0 - 100.0 fL Final  . MCH 05/08/2015 32.1  26.0 - 34.0 pg Final  . MCHC 05/08/2015 33.8  32.0 - 36.0 g/dL Final  . RDW 05/08/2015 13.3  11.5 - 14.5 % Final  . Platelets 05/08/2015 137* 150 - 440 K/uL Final  . Neutrophils Relative % 05/08/2015 58  % Final  . Neutro Abs 05/08/2015 1.9  1.4 - 6.5 K/uL Final  . Lymphocytes Relative 05/08/2015 34  % Final  . Lymphs Abs 05/08/2015 1.1  1.0 - 3.6 K/uL Final  . Monocytes  Relative 05/08/2015 5  % Final  . Monocytes Absolute 05/08/2015 0.2  0.2 - 0.9 K/uL Final  . Eosinophils Relative 05/08/2015 2  % Final  . Eosinophils Absolute 05/08/2015 0.0  0 - 0.7 K/uL Final  . Basophils Relative 05/08/2015 1  % Final  . Basophils Absolute 05/08/2015 0.0  0 - 0.1 K/uL Final  . Sodium 05/08/2015 138  135 - 145 mmol/L Final  . Potassium 05/08/2015 3.4* 3.5 - 5.1 mmol/L Final  . Chloride 05/08/2015 104  101 - 111 mmol/L Final  . CO2 05/08/2015 29  22 - 32 mmol/L Final  . Glucose, Bld 05/08/2015 117* 65 - 99 mg/dL Final  . BUN 05/08/2015 15  6 - 20 mg/dL Final  . Creatinine, Ser 05/08/2015 0.87  0.44 - 1.00 mg/dL Final  . Calcium 05/08/2015 9.1  8.9 - 10.3 mg/dL Final  . Total Protein 05/08/2015 7.7  6.5 - 8.1 g/dL Final  . Albumin 05/08/2015 4.0  3.5 - 5.0 g/dL Final  . AST 05/08/2015 26  15 - 41 U/L Final  . ALT 05/08/2015 14  14 - 54 U/L Final  . Alkaline Phosphatase 05/08/2015 71  38 - 126 U/L Final  . Total Bilirubin 05/08/2015 0.6  0.3 - 1.2 mg/dL Final  . GFR calc non Af Amer 05/08/2015 >60  >60 mL/min Final  . GFR calc Af Amer 05/08/2015 >60  >60 mL/min Final   Comment: (NOTE) The eGFR has been calculated using the CKD EPI equation. This calculation has not been validated in all clinical situations. eGFR's persistently <60 mL/min signify possible Chronic Kidney Disease.   . Anion gap 05/08/2015 5  5 - 15 Final  . CEA 05/09/2015 5.8* 0.0 - 4.7 ng/mL Final   Comment: (NOTE)       Roche ECLIA methodology       Nonsmokers  <3.9  Smokers     <5.6 Performed At: [facility] [address] [place], [place] [id number] Lindon Romp MD WU:[id number]     Assessment:  Amy Bird is a 65 y.o. female with a history of left breast cancer (2002), right sided stage III colon cancer (2014), and mild pancytopenia.  She was diagnosed with left breast cancer in 2002.  She underwent left mastectomy in 03/2000 (no pathology  available).  She received Adriamycin and Cytoxan [facility]) which completed in 07/2000 followed by Herceptin and Taxotere in 08/2000.  She did not receive hormonal therapy.  She underwent right sided mastectomy in 02/2008.  CA27.29 was 40.9 on 06/06/2010, 42.6 on 02/04/2011, 35.3 on 03/31/2012, 38.1 on 11/09/2012, and 52.3 on 09/17/2015.  She has had anemia and neutropenia since 10/2012.  Bone marrow aspiration and biopsy in 10/2012 was negative for any myelodysplastic syndrome.  Bone marrow revealed no iron storage  She was diagnosed with colon cancer in 12/2012.  She underwent right hemicolectomy on 12/24/2012.  Pathology revealed a 3.7 cm grade II adenocarcinoma which extended through the muscularis propria and into the adjacent adipose.  One of 27 lymph nodes were positive.  Pathologic stage III (O5D6UY4).  She was treated on CALGB protocol with FOLFOX chemotherapy +/- celecoxib.  She withdrew from the study in 02/25/2013.  She described trouble with counts.  She received 6 cycles of FOLFOX (01/28/2013 - 04/29/2013).   Last colonoscopy was in 2015 (due 2018)  CEA was 3.8 on 12/09/2012, 6.3 on 01/27/2013, 10.6 on 05/26/2013, 5.9 on 09/22/2013, 7.1 on 05/10/2014, 7.0 on 11/08/2014, 5.8 on 05/08/2015, and 7.2 on 09/17/2015.  Symptomatically, she has had left leg swelling x 1 month.   She has lost 5 pounds.  Exam is unremarkable.  Plan: 1.  Discuss entire medical history, diagnosis and management of breast and colon cancer.  Discuss blood counts. 2.  Labs today:  CBC with diff, CMP, CEA, CA27.29, TSH, free T4, B12, folate. 3.  Left lower extremity duplex- r/o DVT 4.  Patient to return after study 5.  Cancel upcoming appointment 6.  RTC in 6 months for MD assessment and labs (CBC with diff, CMP, CEA, CA27.29)  More than 40 minutes were spent with the patient, obtaining and reviewing records regarding her malignancies and subsequent treatment.   Lequita Asal, MD  09/17/2015, 10:06 AM

## 2015-09-17 NOTE — Progress Notes (Signed)
Patient is here for follow up, she is doing well some minor knee pain   bp was 94/58 pulse 87 sitting  bp recheck 110/71 pulse 98 sitting    bp 112/74 pulse 99 standing   Mentions episode of dizzy and had to pull over from driving

## 2015-09-18 ENCOUNTER — Telehealth: Payer: Self-pay | Admitting: *Deleted

## 2015-09-18 LAB — CEA: CEA: 7.2 ng/mL — ABNORMAL HIGH (ref 0.0–4.7)

## 2015-09-18 LAB — CANCER ANTIGEN 27.29: CA 27.29: 52.3 U/mL — ABNORMAL HIGH (ref 0.0–38.6)

## 2015-09-18 NOTE — Telephone Encounter (Signed)
-----   Message from Lequita Asal, MD sent at 09/18/2015 12:15 PM EDT ----- Regarding: Let's repeat CA27.29 and CEA in 2 weeks  Please let pt know.  Repeat labs in 2 weeks and if still elevated will order scans.  M  ----- Message ----- From: Interface, Lab In [place] Sent: 09/17/2015  11:13 AM To: Lequita Asal, MD

## 2015-09-18 NOTE — Telephone Encounter (Signed)
Called pt but no one answered and after so many rings the phone disconnects. Will try again tom.

## 2015-09-19 ENCOUNTER — Ambulatory Visit
Admission: RE | Admit: 2015-09-19 | Discharge: 2015-09-19 | Disposition: A | Discharge status: Home / Self Care | Payer: Medicare Other | Source: Ambulatory Visit | Attending: Hematology and Oncology | Admitting: Hematology and Oncology

## 2015-09-19 ENCOUNTER — Inpatient Hospital Stay: Payer: Medicare Other

## 2015-09-19 DIAGNOSIS — Z85038 Personal history of other malignant neoplasm of large intestine: Secondary | ICD-10-CM | POA: Diagnosis not present

## 2015-09-19 DIAGNOSIS — R600 Localized edema: Secondary | ICD-10-CM | POA: Diagnosis not present

## 2015-09-19 DIAGNOSIS — M7122 Synovial cyst of popliteal space [Baker], left knee: Secondary | ICD-10-CM | POA: Insufficient documentation

## 2015-09-19 DIAGNOSIS — E538 Deficiency of other specified B group vitamins: Secondary | ICD-10-CM

## 2015-09-20 ENCOUNTER — Telehealth: Payer: Self-pay

## 2015-09-20 NOTE — Telephone Encounter (Signed)
Tried calling patient, unable to reach. Will try again later

## 2015-09-20 NOTE — Telephone Encounter (Signed)
-----   Message from Lequita Asal, MD sent at 09/18/2015 12:15 PM EDT ----- Regarding: Let's repeat CA27.29 and CEA in 2 weeks  Please let pt know.  Repeat labs in 2 weeks and if still elevated will order scans.  M  ----- Message ----- From: Interface, Lab In [place] Sent: 09/17/2015  11:13 AM To: Lequita Asal, MD

## 2015-09-21 ENCOUNTER — Ambulatory Visit: Payer: Medicare Other

## 2015-09-21 ENCOUNTER — Other Ambulatory Visit: Payer: Medicare Other

## 2015-09-22 LAB — METHYLMALONIC ACID, SERUM: Methylmalonic Acid, Quantitative: 136 nmol/L (ref 0–378)

## 2015-09-23 ENCOUNTER — Other Ambulatory Visit: Payer: Self-pay | Admitting: Hematology and Oncology

## 2015-09-23 ENCOUNTER — Encounter: Payer: Self-pay | Admitting: Hematology and Oncology

## 2015-09-23 DIAGNOSIS — C50912 Malignant neoplasm of unspecified site of left female breast: Secondary | ICD-10-CM

## 2015-09-23 DIAGNOSIS — C182 Malignant neoplasm of ascending colon: Secondary | ICD-10-CM

## 2015-09-26 ENCOUNTER — Telehealth: Payer: Self-pay | Admitting: *Deleted

## 2015-09-26 NOTE — Telephone Encounter (Signed)
Called pt 2 other times but unable to leave message but today I did get voicemail and left message for pt to call me back. To discuss lab results

## 2015-09-26 NOTE — Telephone Encounter (Signed)
-----   Message from Lequita Asal, MD sent at 09/18/2015 12:15 PM EDT ----- Regarding: Let's repeat CA27.29 and CEA in 2 weeks  Please let pt know.  Repeat labs in 2 weeks and if still elevated will order scans.  M  ----- Message ----- From: Interface, Lab In [place] Sent: 09/17/2015  11:13 AM To: Lequita Asal, MD

## 2015-09-27 ENCOUNTER — Telehealth: Payer: Self-pay | Admitting: *Deleted

## 2015-09-27 NOTE — Telephone Encounter (Signed)
Pt called back after getting my message from yest. Her CEA and ca 27.29 is elevated. The cea is elevated from last one done but in the past it has been 7.  The ca 27.29 has not been checked in while but it has never been 52 before.  Mike Gip wanted her to repeat in 2 weeks which would be Monday and she is willing to come and get it checked and if it is still elevated I did check with [place] and she will want a ct scan.  Pt is agreeable for lab draw and call her back with results with plan.

## 2015-09-27 NOTE — Telephone Encounter (Signed)
-----   Message from Lequita Asal, MD sent at 09/18/2015 12:15 PM EDT ----- Regarding: Let's repeat CA27.29 and CEA in 2 weeks  Please let pt know.  Repeat labs in 2 weeks and if still elevated will order scans.  M  ----- Message ----- From: Interface, Lab In [place] Sent: 09/17/2015  11:13 AM To: Lequita Asal, MD

## 2015-10-01 ENCOUNTER — Other Ambulatory Visit: Payer: Self-pay

## 2015-10-01 ENCOUNTER — Inpatient Hospital Stay: Payer: Medicare Other | Attending: Hematology and Oncology

## 2015-10-01 DIAGNOSIS — Z9221 Personal history of antineoplastic chemotherapy: Secondary | ICD-10-CM | POA: Diagnosis not present

## 2015-10-01 DIAGNOSIS — F17210 Nicotine dependence, cigarettes, uncomplicated: Secondary | ICD-10-CM | POA: Insufficient documentation

## 2015-10-01 DIAGNOSIS — C50912 Malignant neoplasm of unspecified site of left female breast: Secondary | ICD-10-CM

## 2015-10-01 DIAGNOSIS — Z170 Estrogen receptor positive status [ER+]: Secondary | ICD-10-CM | POA: Insufficient documentation

## 2015-10-01 DIAGNOSIS — Z9011 Acquired absence of right breast and nipple: Secondary | ICD-10-CM | POA: Insufficient documentation

## 2015-10-01 DIAGNOSIS — Z853 Personal history of malignant neoplasm of breast: Secondary | ICD-10-CM | POA: Insufficient documentation

## 2015-10-01 DIAGNOSIS — Z79899 Other long term (current) drug therapy: Secondary | ICD-10-CM | POA: Diagnosis not present

## 2015-10-01 DIAGNOSIS — Z85038 Personal history of other malignant neoplasm of large intestine: Secondary | ICD-10-CM | POA: Diagnosis not present

## 2015-10-01 DIAGNOSIS — C182 Malignant neoplasm of ascending colon: Secondary | ICD-10-CM

## 2015-10-02 ENCOUNTER — Telehealth: Payer: Self-pay | Admitting: *Deleted

## 2015-10-02 ENCOUNTER — Other Ambulatory Visit: Payer: Self-pay | Admitting: Hematology and Oncology

## 2015-10-02 DIAGNOSIS — C50912 Malignant neoplasm of unspecified site of left female breast: Secondary | ICD-10-CM

## 2015-10-02 DIAGNOSIS — C182 Malignant neoplasm of ascending colon: Secondary | ICD-10-CM

## 2015-10-02 LAB — CANCER ANTIGEN 27.29: CA 27.29: 50.5 U/mL — ABNORMAL HIGH (ref 0.0–38.6)

## 2015-10-02 LAB — CEA: CEA: 5.9 ng/mL — ABNORMAL HIGH (ref 0.0–4.7)

## 2015-10-02 NOTE — Telephone Encounter (Signed)
Called pt and left her a message that both tumor markers came down some from 2 weeks ago but still elevated and last week when I had spoke to pt we discussed that if they were still elevated the doctor wanted to plan a scan for pt and she was agreeable.  Dr Mike Gip wants to do pet scan and it is scheduled for 9/19 for [facility] and left special instructions and asked her to call me back to make sure she rcvd by messages and if she had questions.

## 2015-10-02 NOTE — Telephone Encounter (Signed)
-----   Message from Lequita Asal, MD sent at 10/02/2015  9:44 AM EDT ----- Regarding: PET scan ordered  Markers still elevated. Please call patient.  Order in for PET scan.  M  ----- Message ----- From: Interface, Lab In [place] Sent: 10/02/2015   7:41 AM To: Lequita Asal, MD

## 2015-10-03 NOTE — Telephone Encounter (Signed)
Pt called back late yest and confirmed she got my message and she will come to the pet appt

## 2015-10-09 ENCOUNTER — Encounter
Admission: RE | Admit: 2015-10-09 | Discharge: 2015-10-09 | Disposition: A | Discharge status: Home / Self Care | Payer: Medicare Other | Source: Ambulatory Visit | Attending: Hematology and Oncology | Admitting: Hematology and Oncology

## 2015-10-09 DIAGNOSIS — C50912 Malignant neoplasm of unspecified site of left female breast: Secondary | ICD-10-CM | POA: Insufficient documentation

## 2015-10-09 DIAGNOSIS — C182 Malignant neoplasm of ascending colon: Secondary | ICD-10-CM | POA: Diagnosis present

## 2015-10-09 LAB — GLUCOSE, CAPILLARY: Glucose-Capillary: 71 mg/dL (ref 65–99)

## 2015-10-09 MED ORDER — FLUDEOXYGLUCOSE F - 18 (FDG) INJECTION
12.3500 | Freq: Once | INTRAVENOUS | Status: AC | PRN
  Administered 2015-10-09: 12.35 via INTRAVENOUS

## 2015-11-01 ENCOUNTER — Ambulatory Visit: Payer: Self-pay | Attending: Oncology | Admitting: Occupational Therapy

## 2015-11-01 DIAGNOSIS — R600 Localized edema: Secondary | ICD-10-CM | POA: Insufficient documentation

## 2015-11-01 NOTE — Therapy (Signed)
[facility] [address], [place], [address] Phone: [phone]   Fax:  [phone]  Occupational Therapy Screen  Patient Details  Name: Amy Bird MRN: [id number] Date of Birth: 04-15-1950 No Data Recorded  Encounter Date: 11/01/2015    Past Medical History:  Diagnosis Date  . Abdominal pain 2013  . Anemia   . Arthritis   . Bleeding nose   . Blood in urine   . Breast cancer, left breast ([place]) 2002   Left Mastectomy, chemo + Rad tx's.  . Breast cyst   . Bronchitis   . Bruises easily   . Colon cancer ([place]) 10/2012   Partial colon resection and chemo tx's.  . Colon cancer ([place])   . Difficulty urinating   . Diverticulitis   . Glaucoma   . Hearing loss   . Hyperlipidemia 20 years  . Kidney stone   . Neuromuscular disorder ([place])    neuropathy  feet and hands from chemo tx  . Pneumonia   . Shingles   . Swollen lymph nodes     Past Surgical History:  Procedure Laterality Date  . ABDOMINAL HYSTERECTOMY  1973  . APPENDECTOMY  1969  . CATARACT EXTRACTION W/PHACO Left 07/11/2015   Procedure: CATARACT EXTRACTION PHACO AND INTRAOCULAR LENS PLACEMENT ([place]) left eye;  Surgeon: Leandrew Koyanagi, MD;  Location: [facility];  Service: Ophthalmology;  Laterality: Left;  . CESAREAN SECTION    . COLON RESECTION    . COLONOSCOPY  2004  . kidney stone removal     . MASTECTOMY Bilateral 2002,2010  . salpingo oophorectmy   1973    There were no vitals filed for this visit.           LYMPHEDEMA/ONCOLOGY QUESTIONNAIRE - 11/01/15 1305      Right Lower Extremity Lymphedema   At Midpatella/Popliteal Crease 36 cm   30 cm Proximal to Floor at Lateral Plantar Foot 33.8 cm   20 cm Proximal to Floor at Lateral Plantar Foot 26.3 1   10  cm Proximal to Floor at Lateral Malleoli 20 cm   Circumference of ankle/heel 30.8 cm.   5 cm Proximal to 1st MTP Joint 21.8 cm   Around Proximal Great Toe 7.5  cm     Left Lower Extremity Lymphedema   At Midpatella/Popliteal Crease 37 cm   30 cm Proximal to Floor at Lateral Plantar Foot 35.4 cm   20 cm Proximal to Floor at Lateral Plantar Foot 26.5 cm   10 cm Proximal to Floor at Lateral Malleoli 21.3 cm   Circumference of ankle/heel 31 cm.   5 cm Proximal to 1st MTP Joint 20.7 cm   Around Proximal Great Toe 7.5 cm        Pt was seen by this CLT after her L mastectomy - pt develop L UE lymphedema - after she injured L shoulder  Pt maintain L UE with daytime Jobst Elvarex soft daytime garment since 2010 Pt had in 2014 colon CA with surgery by Dr Tamala Julian Pt present with increase edema in L LE- pt had lymphnodes removed  And report she had foot pain about 2 months ago - with plantar fascitis - and after that as the day goes and she is more on her feet- her ankle and below calf really swell. Pt measurements increase from ankle to knee - but above knee - was even Recommend knee high compression for daytimes - but need to be measure early  am Talked with rep Hoyle Sauer - and she will contact her - did send info   pt to contact me after she wore compression for 2 wks - to be remeasure                           Patient will benefit from skilled therapeutic intervention in order to improve the following deficits and impairments:     Visit Diagnosis: Localized edema    Problem List Patient Active Problem List   Diagnosis Date Noted  . Breast cancer, female, left 09/17/2015  . Cancer of ascending colon ([place]) 09/17/2015  . Iron deficiency anemia 09/17/2015  . Leukopenia 09/17/2015  . Personal history of tobacco use, presenting hazards to health 06/07/2015  . Cyst of pituitary gland ([place]) 11/17/2014  . Acute cystitis 11/08/2014  . Absolute anemia 11/08/2014  . Neutropenia ([place]) 11/08/2014  . Polyneuropathy ([place]) 08/10/2014  . Abdominal pain   . Shingles   . Abnormal loss of weight 08/17/2009  . Abnormal laboratory test  08/20/2007  . Acute bronchitis 12/30/2006  . Allergic rhinitis 01/29/2005    Rosalyn Gess OTR/L,CLT 11/01/2015, 1:07 PM  [facility] [address] [place], [address] Phone: [phone]   Fax:  [phone]  Name: Amy Bird MRN: [id number] Date of Birth: 30-Aug-1950

## 2015-11-08 ENCOUNTER — Ambulatory Visit: Payer: Medicare Other | Admitting: Hematology and Oncology

## 2015-11-08 ENCOUNTER — Other Ambulatory Visit: Payer: Medicare Other

## 2015-11-15 ENCOUNTER — Other Ambulatory Visit: Payer: Medicare Other

## 2015-11-15 ENCOUNTER — Ambulatory Visit: Payer: Medicare Other | Admitting: Hematology and Oncology

## 2015-11-26 ENCOUNTER — Telehealth: Payer: Self-pay | Admitting: *Deleted

## 2015-11-26 NOTE — Telephone Encounter (Signed)
  Ok to join the gym.  M

## 2015-11-26 NOTE — Telephone Encounter (Signed)
States she joined [employer] and they need clearance from [place]

## 2015-11-27 NOTE — Telephone Encounter (Signed)
Faxed form from [employer] yest. 11/6

## 2015-12-25 ENCOUNTER — Ambulatory Visit: Payer: Self-pay | Admitting: Occupational Therapy

## 2016-02-04 ENCOUNTER — Telehealth: Payer: Self-pay | Admitting: *Deleted

## 2016-02-04 NOTE — Telephone Encounter (Signed)
Patient scheduled for Lab/MD 02-06-16.

## 2016-02-04 NOTE — Telephone Encounter (Signed)
  Let's see her this week.  M

## 2016-02-04 NOTE — Telephone Encounter (Signed)
Called to report that she is not feeling well, tired, feels "off balance" congested. Has had unintentional weight loss of 18 pounds in 4-5 months. Very concerned as she has lost from a size 8 to a size 4. Old patient of [facility] has not been seen by anyone else yet, has an appt in Feb with Dr Mike Gip.  Please advise

## 2016-02-04 NOTE — Telephone Encounter (Signed)
Per Dr Grayland Ormond, move her appt up [facility] informed and she stated she will call patient with appt

## 2016-02-06 ENCOUNTER — Ambulatory Visit: Payer: Medicare Other | Admitting: Hematology and Oncology

## 2016-02-06 ENCOUNTER — Other Ambulatory Visit: Payer: Medicare Other

## 2016-02-10 DIAGNOSIS — E538 Deficiency of other specified B group vitamins: Secondary | ICD-10-CM | POA: Insufficient documentation

## 2016-02-10 NOTE — Progress Notes (Signed)
[facility] day:  02/11/2016  Chief Complaint: Amy Bird is a 66 y.o. female with a history of left breast cancer (2002), right sided colon cancer (2014), and mild pancytopenia who is seen for reassessment secondary to unexplained fatigue and weight loss.  HPI:  The patient was last seen in the medical oncology clinic on 09/17/2015.  At that time, she was seen for initial assessment by me.  She had left leg swelling x 1 month.   She had lost 5 pounds.  Left lower extremity duplex on 09/19/2015 revealed no evidence of DVT.  She had a left popliteal fossa Baker's cyst.  Labs at last visit included a hematocrit 39.6, hemoglobin 13.3, MCV 96, platelets 128,000, white count 3000 with ANC of 1700. Differential was unremarkable.  CMP was normal.  PT was 11.3 with an INR of 0.82.  B12 was 266 (low normal).  Folate was 17 (normal).  Ferritin was 20.  TSH and free T4 were normal.  CEA was 7.2 (previously 5.9 - 7.1 in the past 2 years without trend).  CA27.29 was 52.3 (0-38.6).    MMA on 09/19/2015 was 136 (normal) thus ruling out B12 deficiency.  CEA was 5.9 and CA27.29 was 50.5 on 10/01/2015.  Decision was made to pursue imaging.  PET scan on 10/09/2015 revealed no evidence of recurrent disease.  Patient contacted the clinic on 02/04/2016 with symptoms of feeling not well, tired, "off balanced" and congested.  She had lost 18 pounds in 4-5 months.  She had gone from a size 8 to a size 4.  Symptomatically, she describes something being wrong or "off".  She is a little tired.  She eats well, but is losing weight.  Sleep is poor.  Some days she eats better, but gets full.  She comments that her toes are dark red after wearing shoes.  He heart rate fluctuates.   Past Medical History:  Diagnosis Date  . Abdominal pain 2013  . Anemia   . Arthritis   . Bleeding nose   . Blood in urine   . Breast cancer, left breast ([place]) 2002   Left Mastectomy, chemo + Rad  tx's.  . Breast cyst   . Bronchitis   . Bruises easily   . Colon cancer ([place]) 10/2012   Partial colon resection and chemo tx's.  . Colon cancer ([place])   . Difficulty urinating   . Diverticulitis   . Glaucoma   . Hearing loss   . Hyperlipidemia 20 years  . Kidney stone   . Neuromuscular disorder ([place])    neuropathy  feet and hands from chemo tx  . Pneumonia   . Shingles   . Swollen lymph nodes     Past Surgical History:  Procedure Laterality Date  . ABDOMINAL HYSTERECTOMY  1973  . APPENDECTOMY  1969  . CATARACT EXTRACTION W/PHACO Left 07/11/2015   Procedure: CATARACT EXTRACTION PHACO AND INTRAOCULAR LENS PLACEMENT ([place]) left eye;  Surgeon: Leandrew Koyanagi, MD;  Location: [facility];  Service: Ophthalmology;  Laterality: Left;  . CESAREAN SECTION    . COLON RESECTION    . COLONOSCOPY  2004  . kidney stone removal     . MASTECTOMY Bilateral 2002,2010  . salpingo oophorectmy   1973    Family History  Problem Relation Age of Onset  . Hypertension Mother   . Alzheimer's disease Mother   . Cancer Father     prostate  . Cancer Sister  breast  . Hypertension Sister   . Cancer Brother     stomach  . Hypertension Brother   . Sarcoidosis Other     positive family history.  . Cancer Sister     breast  . Cancer Sister     breast    Social History:  reports that she has quit smoking. Her smoking use included Cigarettes. She has a 52.50 pack-year smoking history. She does not have any smokeless tobacco history on file. She reports that she does not drink alcohol or use drugs.  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  She quit smoking in 2003.  She lives in [place].  [employer] sne[employer].  The patient is alone today.  Allergies:  Allergies  Allergen Reactions  . Sulfa Antibiotics Swelling  . Hydrocodone-Ibuprofen Other (See Comments)    Patient feels like there is a heavy wight on her chest  . Statins Other (See Comments)    Leg cramps and groin pain  .  Other Itching    Tree Nuts  . Tape Rash    Current Medications: Current Outpatient Prescriptions  Medication Sig Dispense Refill  . Calcium Carbonate-Vit D-Min (CALCIUM 1200 PO) Take 1 tablet by mouth daily. Reported on 07/09/2015    . latanoprost (XALATAN) 0.005 % ophthalmic solution Place 1 drop into both eyes at bedtime.    . vitamin C (ASCORBIC ACID) 500 MG tablet Take 500 mg by mouth daily.    . Difluprednate (DUREZOL) 0.05 % EMUL Apply to eye.    . nepafenac (ILEVRO) 0.3 % ophthalmic suspension Place 3 drops into the left eye daily.     No current facility-administered medications for this visit.     Review of Systems:  GENERAL:  Feels "off" and "something is wrong".  Little tired.  No fevers or sweats.  Weight loss of 18 pounds. PERFORMANCE STATUS (ECOG):  1 HEENT:  No visual changes, runny nose, sore throat, mouth sores or tenderness. Lungs: No shortness of breath or cough.  No hemoptysis. Cardiac:  Heart rate fluctuating.  No chest pain, palpitations, orthopnea, or PND. GI:  Eating well.  Constipation.  No nausea, vomiting, diarrhea, constipation, melena or hematochezia. GU:  No urgency, frequency, dysuria, or hematuria. Musculoskeletal:  No back pain.  No joint pain.  No muscle tenderness. Extremities:  Toes dark after wearing shoes. Skin:  No rashes or skin changes. Neuro:  No headache, numbness or weakness, balance or coordination issues. Endocrine:  No diabetes, thyroid issues, hot flashes or night sweats. Psych:  No mood changes, depression or anxiety.  Poor sleep. Pain:  No focal pain. Review of systems:  All other systems reviewed and found to be negative.  Physical Exam:  Blood pressure 119/77, pulse 81, temperature (!) 95.5 F (35.3 C), temperature source Tympanic, resp. rate 18, weight 119 lb 14.9 oz (54.4 kg). GENERAL:  Well developed, well nourished, woman sitting comfortably in the exam room in no acute distress. MENTAL STATUS:  Alert and oriented to person,  place and time. HEAD:  Brown jaw length hair.  Normocephalic, atraumatic, face symmetric, no Cushingoid features. EYES:  Glasses.  Brown eyes.  Pupils equal round and reactive to light and accomodation.  No conjunctivitis or scleral icterus. ENT:  Oropharynx clear without lesion.  Tongue normal. Mucous membranes moist.  RESPIRATORY:  Clear to auscultation without rales, wheezes or rhonchi. CARDIOVASCULAR:  Regular rate and rhythm without murmur, rub or gallop. BREAST:  Bilateral mastectomy.  No skin changes or masses.  ABDOMEN:  Soft, non-tender, with active bowel sounds, and no hepatosplenomegaly.  No masses. SKIN:  No rashes, ulcers or lesions. EXTREMITIES: Slight left lower extremity edema.  No skin discoloration or tenderness.  No palpable cords. LYMPH NODES: No palpable cervical, supraclavicular, axillary or inguinal adenopathy  NEUROLOGICAL: Unremarkable. Stumble a little bit. PSYCH:  Appropriate.   Appointment on 02/11/2016  Component Date Value Ref Range Status  . WBC 02/11/2016 3.3* 3.6 - 11.0 K/uL Final  . RBC 02/11/2016 4.19  3.80 - 5.20 MIL/uL Final  . Hemoglobin 02/11/2016 13.5  12.0 - 16.0 g/dL Final  . HCT 02/11/2016 40.4  35.0 - 47.0 % Final  . MCV 02/11/2016 96.5  80.0 - 100.0 fL Final  . MCH 02/11/2016 32.3  26.0 - 34.0 pg Final  . MCHC 02/11/2016 33.5  32.0 - 36.0 g/dL Final  . RDW 02/11/2016 13.3  11.5 - 14.5 % Final  . Platelets 02/11/2016 142* 150 - 440 K/uL Final  . Neutrophils Relative % 02/11/2016 65  % Final  . Neutro Abs 02/11/2016 2.1  1.4 - 6.5 K/uL Final  . Lymphocytes Relative 02/11/2016 28  % Final  . Lymphs Abs 02/11/2016 0.9* 1.0 - 3.6 K/uL Final  . Monocytes Relative 02/11/2016 5  % Final  . Monocytes Absolute 02/11/2016 0.2  0.2 - 0.9 K/uL Final  . Eosinophils Relative 02/11/2016 1  % Final  . Eosinophils Absolute 02/11/2016 0.0  0 - 0.7 K/uL Final  . Basophils Relative 02/11/2016 1  % Final  . Basophils Absolute 02/11/2016 0.0  0 - 0.1 K/uL  Final  . Sodium 02/11/2016 140  135 - 145 mmol/L Final  . Potassium 02/11/2016 3.2* 3.5 - 5.1 mmol/L Final  . Chloride 02/11/2016 106  101 - 111 mmol/L Final  . CO2 02/11/2016 28  22 - 32 mmol/L Final  . Glucose, Bld 02/11/2016 97  65 - 99 mg/dL Final  . BUN 02/11/2016 11  6 - 20 mg/dL Final  . Creatinine, Ser 02/11/2016 0.77  0.44 - 1.00 mg/dL Final  . Calcium 02/11/2016 8.8* 8.9 - 10.3 mg/dL Final  . Total Protein 02/11/2016 7.6  6.5 - 8.1 g/dL Final  . Albumin 02/11/2016 3.9  3.5 - 5.0 g/dL Final  . AST 02/11/2016 26  15 - 41 U/L Final  . ALT 02/11/2016 13* 14 - 54 U/L Final  . Alkaline Phosphatase 02/11/2016 73  38 - 126 U/L Final  . Total Bilirubin 02/11/2016 0.5  0.3 - 1.2 mg/dL Final  . GFR calc non Af Amer 02/11/2016 >60  >60 mL/min Final  . GFR calc Af Amer 02/11/2016 >60  >60 mL/min Final   Comment: (NOTE) The eGFR has been calculated using the CKD EPI equation. This calculation has not been validated in all clinical situations. eGFR's persistently <60 mL/min signify possible Chronic Kidney Disease.   Georgiann Hahn gap 02/11/2016 6  5 - 15 Final    Assessment:  Amy Bird is a 66 y.o. female with a history of left breast cancer (2002), right sided stage III colon cancer (2014), and mild pancytopenia.  She was diagnosed with left breast cancer in 2002.  She underwent left mastectomy in 03/2000 (no pathology available).  She received Adriamycin and Cytoxan [facility]) which completed in 07/2000 followed by Herceptin and Taxotere in 08/2000.  She did not receive hormonal therapy.  She underwent right sided mastectomy in 02/2008.  CA27.29 was 40.9 on 06/06/2010, 42.6 on 02/04/2011, 35.3 on 03/31/2012, 38.1 on 11/09/2012, 52.3 on 09/17/2015, 50.5 on  10/01/2015, and 47.8 on 02/11/2016.  She has intermittent anemia, neutropenia, and thrombocytopenia since 10/2012.  Bone marrow aspiration and biopsy in 10/2012 was negative for any myelodysplastic syndrome.  Bone marrow revealed no iron  storage.  CBC on 09/17/2015 revealed a hematocrit 39.6, hemoglobin 13.3, MCV 96, platelets 128,000, white count 3000 with ANC of 1700. B12 was 266 (low normal) with a normal MMA on 09/19/2015.  Normal studies included:  folate, TSH, free T4.  Ferritin was 20.    She was diagnosed with colon cancer in 12/2012.  She underwent right hemicolectomy on 12/24/2012.  Pathology revealed a 3.7 cm grade II adenocarcinoma which extended through the muscularis propria and into the adjacent adipose.  One of 27 lymph nodes were positive.  Pathologic stage III ([id number]).    She was treated on CALGB protocol with FOLFOX chemotherapy +/- celecoxib.  She withdrew from the study in 02/25/2013.  She described trouble with counts.  She received 6 cycles of FOLFOX (01/28/2013 - 04/29/2013).   Last colonoscopy was in 2015 (due 2018)  CEA was 3.8 on 12/09/2012, 6.3 on 01/27/2013, 10.6 on 05/26/2013, 5.9 on 09/22/2013, 7.1 on 05/10/2014, 7.0 on 11/08/2014, 5.8 on 05/08/2015, 7.2 on 09/17/2015, 5.9 on 10/01/2015, and 6.5 on 02/11/2016.  PET scan on 10/09/2015 revealed no evidence of recurrent disease.  Left lower extremity duplex on 09/19/2015 revealed no evidence of DVT.  She had a left popliteal fossa Baker's cyst.  Symptomatically, she continues to lose weight despite eating well.  Exam is unremarkable.  Potasium is 3.2.  Plan: 1.  Review labs from labs visit and images since last visit.  PET scan on 10/09/2015 revealed no evidence of disease.. 2.  Labs today:  CBC with diff, CMP, B12, CEA, CA27.29, TSH, free T4. 3.  Schedule chest, abdomen, and pelvic CT scan 4.  Rx:  potassium 10 meq po BID x 3 days. 5.  RTC after CT scans for MD assessment.   Lequita Asal, MD  02/11/2016, 9:53 AM

## 2016-02-11 ENCOUNTER — Inpatient Hospital Stay: Payer: Medicare Other | Attending: Hematology and Oncology

## 2016-02-11 ENCOUNTER — Inpatient Hospital Stay (HOSPITAL_BASED_OUTPATIENT_CLINIC_OR_DEPARTMENT_OTHER): Payer: Medicare Other | Admitting: Hematology and Oncology

## 2016-02-11 ENCOUNTER — Ambulatory Visit: Payer: Medicare Other | Admitting: Hematology and Oncology

## 2016-02-11 ENCOUNTER — Other Ambulatory Visit: Payer: Medicare Other

## 2016-02-11 VITALS — BP 119/77 | HR 81 | Temp 95.5°F | Resp 18 | Wt 119.9 lb

## 2016-02-11 DIAGNOSIS — Z79899 Other long term (current) drug therapy: Secondary | ICD-10-CM | POA: Diagnosis not present

## 2016-02-11 DIAGNOSIS — E876 Hypokalemia: Secondary | ICD-10-CM | POA: Insufficient documentation

## 2016-02-11 DIAGNOSIS — D61818 Other pancytopenia: Secondary | ICD-10-CM

## 2016-02-11 DIAGNOSIS — Z9221 Personal history of antineoplastic chemotherapy: Secondary | ICD-10-CM | POA: Diagnosis not present

## 2016-02-11 DIAGNOSIS — C50912 Malignant neoplasm of unspecified site of left female breast: Secondary | ICD-10-CM

## 2016-02-11 DIAGNOSIS — Z853 Personal history of malignant neoplasm of breast: Secondary | ICD-10-CM | POA: Diagnosis present

## 2016-02-11 DIAGNOSIS — C182 Malignant neoplasm of ascending colon: Secondary | ICD-10-CM

## 2016-02-11 DIAGNOSIS — R634 Abnormal weight loss: Secondary | ICD-10-CM

## 2016-02-11 DIAGNOSIS — F17210 Nicotine dependence, cigarettes, uncomplicated: Secondary | ICD-10-CM | POA: Insufficient documentation

## 2016-02-11 DIAGNOSIS — Z85038 Personal history of other malignant neoplasm of large intestine: Secondary | ICD-10-CM

## 2016-02-11 DIAGNOSIS — E785 Hyperlipidemia, unspecified: Secondary | ICD-10-CM | POA: Diagnosis not present

## 2016-02-11 DIAGNOSIS — Z923 Personal history of irradiation: Secondary | ICD-10-CM | POA: Insufficient documentation

## 2016-02-11 DIAGNOSIS — Z170 Estrogen receptor positive status [ER+]: Secondary | ICD-10-CM

## 2016-02-11 DIAGNOSIS — E538 Deficiency of other specified B group vitamins: Secondary | ICD-10-CM

## 2016-02-11 DIAGNOSIS — Z9013 Acquired absence of bilateral breasts and nipples: Secondary | ICD-10-CM | POA: Diagnosis not present

## 2016-02-11 LAB — COMPREHENSIVE METABOLIC PANEL
ALT: 13 U/L — ABNORMAL LOW (ref 14–54)
AST: 26 U/L (ref 15–41)
Albumin: 3.9 g/dL (ref 3.5–5.0)
Alkaline Phosphatase: 73 U/L (ref 38–126)
Anion gap: 6 (ref 5–15)
BUN: 11 mg/dL (ref 6–20)
CO2: 28 mmol/L (ref 22–32)
Calcium: 8.8 mg/dL — ABNORMAL LOW (ref 8.9–10.3)
Chloride: 106 mmol/L (ref 101–111)
Creatinine, Ser: 0.77 mg/dL (ref 0.44–1.00)
GFR calc Af Amer: >60 mL/min (ref >60)
GFR calc non Af Amer: >60 mL/min (ref >60)
Glucose, Bld: 97 mg/dL (ref 65–99)
Potassium: 3.2 mmol/L — ABNORMAL LOW (ref 3.5–5.1)
Sodium: 140 mmol/L (ref 135–145)
Total Bilirubin: 0.5 mg/dL (ref 0.3–1.2)
Total Protein: 7.6 g/dL (ref 6.5–8.1)

## 2016-02-11 LAB — CBC WITH DIFFERENTIAL/PLATELET
Basophils Absolute: 0 10*3/uL (ref 0–0.1)
Basophils Relative: 1 %
Eosinophils Absolute: 0 10*3/uL (ref 0–0.7)
Eosinophils Relative: 1 %
HCT: 40.4 % (ref 35.0–47.0)
Hemoglobin: 13.5 g/dL (ref 12.0–16.0)
Lymphocytes Relative: 28 %
Lymphs Abs: 0.9 10*3/uL — ABNORMAL LOW (ref 1.0–3.6)
MCH: 32.3 pg (ref 26.0–34.0)
MCHC: 33.5 g/dL (ref 32.0–36.0)
MCV: 96.5 fL (ref 80.0–100.0)
Monocytes Absolute: 0.2 10*3/uL (ref 0.2–0.9)
Monocytes Relative: 5 %
Neutro Abs: 2.1 10*3/uL (ref 1.4–6.5)
Neutrophils Relative %: 65 %
Platelets: 142 10*3/uL — ABNORMAL LOW (ref 150–440)
RBC: 4.19 MIL/uL (ref 3.80–5.20)
RDW: 13.3 % (ref 11.5–14.5)
WBC: 3.3 10*3/uL — ABNORMAL LOW (ref 3.6–11.0)

## 2016-02-11 LAB — VITAMIN B12: Vitamin B-12: 231 pg/mL (ref 180–914)

## 2016-02-11 LAB — TSH: TSH: 1.19 u[IU]/mL (ref 0.350–4.500)

## 2016-02-11 LAB — T4, FREE: Free T4: 0.86 ng/dL (ref 0.61–1.12)

## 2016-02-11 MED ORDER — POTASSIUM CHLORIDE ER 10 MEQ PO TBCR: 10.0000 meq | EXTENDED_RELEASE_TABLET | Freq: Two times a day (BID) | ORAL | 0 refills | Status: DC

## 2016-02-11 NOTE — Progress Notes (Signed)
Patient here today due to concerns regarding weight loss.  She states she continues to lose weight even though she eats well.  States there are times she "just feels off".  Complaints of heart "fluttering" at times.  Had eye surgery on 07-11-15 and states it will not heal.  Further complains of her toes turning dark red when she wears her shoes.  States they look like they are bruised.  Once she takes the shoes off, after a bit they are okay. Patient has history of breast cancer.

## 2016-02-12 LAB — CANCER ANTIGEN 27.29: CA 27.29: 47.8 U/mL — ABNORMAL HIGH (ref 0.0–38.6)

## 2016-02-12 LAB — CEA: CEA: 6.5 ng/mL — ABNORMAL HIGH (ref 0.0–4.7)

## 2016-02-13 ENCOUNTER — Other Ambulatory Visit: Payer: Medicare Other

## 2016-02-13 ENCOUNTER — Ambulatory Visit: Payer: Medicare Other | Admitting: Hematology and Oncology

## 2016-02-15 ENCOUNTER — Ambulatory Visit
Admission: RE | Admit: 2016-02-15 | Discharge: 2016-02-15 | Disposition: A | Discharge status: Home / Self Care | Payer: Medicare Other | Source: Ambulatory Visit | Attending: Hematology and Oncology | Admitting: Hematology and Oncology

## 2016-02-15 DIAGNOSIS — I700 Atherosclerosis of aorta: Secondary | ICD-10-CM | POA: Insufficient documentation

## 2016-02-15 DIAGNOSIS — E876 Hypokalemia: Secondary | ICD-10-CM | POA: Diagnosis not present

## 2016-02-15 DIAGNOSIS — C182 Malignant neoplasm of ascending colon: Secondary | ICD-10-CM | POA: Diagnosis present

## 2016-02-15 DIAGNOSIS — R634 Abnormal weight loss: Secondary | ICD-10-CM | POA: Insufficient documentation

## 2016-02-15 DIAGNOSIS — M5136 Other intervertebral disc degeneration, lumbar region: Secondary | ICD-10-CM | POA: Diagnosis not present

## 2016-02-15 DIAGNOSIS — E538 Deficiency of other specified B group vitamins: Secondary | ICD-10-CM | POA: Insufficient documentation

## 2016-02-15 DIAGNOSIS — R918 Other nonspecific abnormal finding of lung field: Secondary | ICD-10-CM | POA: Diagnosis not present

## 2016-02-15 DIAGNOSIS — K5641 Fecal impaction: Secondary | ICD-10-CM | POA: Diagnosis not present

## 2016-02-15 DIAGNOSIS — Z853 Personal history of malignant neoplasm of breast: Secondary | ICD-10-CM

## 2016-02-15 MED ORDER — IOPAMIDOL (ISOVUE-300) INJECTION 61%
85.0000 mL | Freq: Once | INTRAVENOUS | Status: AC | PRN
  Administered 2016-02-15: 100 mL via INTRAVENOUS

## 2016-02-20 ENCOUNTER — Telehealth: Payer: Self-pay | Admitting: *Deleted

## 2016-02-20 NOTE — Telephone Encounter (Signed)
Have her call and go see her PCP. She is not anemic and labs are otherwise good.  She might be hypotensive

## 2016-02-20 NOTE — Telephone Encounter (Signed)
Call returned to patient and was advised to see PCP. Left message

## 2016-02-20 NOTE — Telephone Encounter (Signed)
Called to report that she has been feeling "off balance" for several days; dizzinesswhen she gets up or moves too fast. Not sure if she should be concerned. Please advise

## 2016-02-24 ENCOUNTER — Encounter: Payer: Self-pay | Admitting: Hematology and Oncology

## 2016-02-24 NOTE — Progress Notes (Signed)
[facility] day:  02/25/2016  Chief Complaint: Amy Bird is a 66 y.o. female with a history of left breast cancer (2002), right sided colon cancer (2014), mild pancytopenia, and unexplained weight loss  who is seen for review of interval CT scans.  HPI:  The patient was last seen in the medical oncology clinic on 02/11/2016.  At that time,  She felt "off".  Despite eating well, she was losing weight.  She had lost 18 pounds since 09/16/2016.  Labs revealed a potassium of 3.2 and albumen 3.9.  Liver function tests were normal.  CA27.29 was 47.8 and CEA 6.5.  TSH was 1.190 and free T4 was 0.86 (normal).  Imaging studies were ordered.  Chest, abdomen, and pelvic CT scan on 02/15/2016 revealed no findings of recurrent malignancy.  There were radiation therapy related opacities in the left lung.  There was prominent stool throughout the colon.  There was lumbar degenerative disc disease.  Symptomatically, she notes no diarrhea.  She denies any nausea or vomiting.  She is not eating as much.  She feels a little dizzy.  She has an appointment with ENT on 02/27/2016.   Past Medical History:  Diagnosis Date  . Abdominal pain 2013  . Anemia   . Arthritis   . Bleeding nose   . Blood in urine   . Breast cancer, left breast ([place]) 2002   Left Mastectomy, chemo + Rad tx's.  . Breast cyst   . Bronchitis   . Bruises easily   . Colon cancer ([place]) 10/2012   Partial colon resection and chemo tx's.  . Colon cancer ([place])   . Difficulty urinating   . Diverticulitis   . Glaucoma   . Hearing loss   . Hyperlipidemia 20 years  . Kidney stone   . Neuromuscular disorder ([place])    neuropathy  feet and hands from chemo tx  . Pneumonia   . Shingles   . Swollen lymph nodes     Past Surgical History:  Procedure Laterality Date  . ABDOMINAL HYSTERECTOMY  1973  . APPENDECTOMY  1969  . CATARACT EXTRACTION W/PHACO Left 07/11/2015   Procedure: CATARACT EXTRACTION  PHACO AND INTRAOCULAR LENS PLACEMENT ([place]) left eye;  Surgeon: Leandrew Koyanagi, MD;  Location: [employer] [facility];  Service: Ophthalmology;  Laterality: Left;  . CESAREAN SECTION    . COLON RESECTION    . COLONOSCOPY  2004  . kidney stone removal     . MASTECTOMY Bilateral 2002,2010  . salpingo oophorectmy   1973    Family History  Problem Relation Age of Onset  . Hypertension Mother   . Alzheimer's disease Mother   . Cancer Father     prostate  . Cancer Sister     breast  . Hypertension Sister   . Cancer Brother     stomach  . Hypertension Brother   . Sarcoidosis Other     positive family history.  . Cancer Sister     breast  . Cancer Sister     breast    Social History:  reports that she has quit smoking. Her smoking use included Cigarettes. She has a 52.50 pack-year smoking history. She has never used smokeless tobacco. She reports that she does not drink alcohol or use drugs.  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  She quit smoking in 2003.  She lives in [place].  [employer] sne[employer].  The patient is alone today.  Allergies:  Allergies  Allergen Reactions  . Sulfa Antibiotics Swelling  . Hydrocodone-Ibuprofen Other (See Comments)    Patient feels like there is a heavy wight on her chest  . Statins Other (See Comments)    Leg cramps and groin pain  . Other Itching    Tree Nuts  . Tape Rash    Current Medications: Current Outpatient Prescriptions  Medication Sig Dispense Refill  . Calcium Carbonate-Vit D-Min (CALCIUM 1200 PO) Take 1 tablet by mouth daily. Reported on 07/09/2015    . Difluprednate (DUREZOL) 0.05 % EMUL Apply to eye.    . latanoprost (XALATAN) 0.005 % ophthalmic solution Place 1 drop into both eyes at bedtime.    . nepafenac (ILEVRO) 0.3 % ophthalmic suspension Place 3 drops into the left eye daily.    . vitamin C (ASCORBIC ACID) 500 MG tablet Take 500 mg by mouth daily.    . potassium chloride (KLOR-CON 10) 10 MEQ tablet Take 1 tablet (10  mEq total) by mouth 2 (two) times daily. For 3 days (Patient not taking: Reported on 02/25/2016) 6 tablet 0   No current facility-administered medications for this visit.     Review of Systems:  GENERAL:  Feels "off" and "something is wrong".  Little tired.  No fevers or sweats.  Weight loss of 3 pounds since last visit. PERFORMANCE STATUS (ECOG):  1 HEENT:  No visual changes, runny nose, sore throat, mouth sores or tenderness. Lungs: No shortness of breath or cough.  No hemoptysis. Cardiac:  Heart rate fluctuating.  No chest pain, palpitations, orthopnea, or PND. GI:  No eating as much.  No nausea, vomiting, diarrhea, constipation, melena or hematochezia. GU:  No urgency, frequency, dysuria, or hematuria. Musculoskeletal:  No back pain.  No joint pain.  No muscle tenderness. Extremities:  Toes dark after wearing shoes. Skin:  No rashes or skin changes. Neuro:  Dizzy.  No headache, numbness or weakness, balance or coordination issues. Endocrine:  No diabetes, thyroid issues, hot flashes or night sweats. Psych:  No mood changes, depression or anxiety.  Poor sleep. Pain:  No focal pain. Review of systems:  All other systems reviewed and found to be negative.  Physical Exam:  Blood pressure 103/65, pulse 99, temperature 97.3 F (36.3 C), temperature source Tympanic, resp. rate 18, weight 116 lb 13.5 oz (53 kg). GENERAL:  Well developed, well nourished, woman sitting comfortably in the exam room in no acute distress. MENTAL STATUS:  Alert and oriented to person, place and time. HEAD:  Brown jaw length hair.  Normocephalic, atraumatic, face symmetric, no Cushingoid features. EYES:  Glasses.  Brown eyes.  No conjunctivitis or scleral icterus. NEUROLOGICAL: Unremarkable.  PSYCH:  Appropriate.   No visits with results within 3 Day(s) from this visit.  Latest known visit with results is:  Appointment on 02/11/2016  Component Date Value Ref Range Status  . WBC 02/11/2016 3.3* 3.6 - 11.0 K/uL  Final  . RBC 02/11/2016 4.19  3.80 - 5.20 MIL/uL Final  . Hemoglobin 02/11/2016 13.5  12.0 - 16.0 g/dL Final  . HCT 02/11/2016 40.4  35.0 - 47.0 % Final  . MCV 02/11/2016 96.5  80.0 - 100.0 fL Final  . MCH 02/11/2016 32.3  26.0 - 34.0 pg Final  . MCHC 02/11/2016 33.5  32.0 - 36.0 g/dL Final  . RDW 02/11/2016 13.3  11.5 - 14.5 % Final  . Platelets 02/11/2016 142* 150 - 440 K/uL Final  . Neutrophils Relative % 02/11/2016 65  % Final  .  Neutro Abs 02/11/2016 2.1  1.4 - 6.5 K/uL Final  . Lymphocytes Relative 02/11/2016 28  % Final  . Lymphs Abs 02/11/2016 0.9* 1.0 - 3.6 K/uL Final  . Monocytes Relative 02/11/2016 5  % Final  . Monocytes Absolute 02/11/2016 0.2  0.2 - 0.9 K/uL Final  . Eosinophils Relative 02/11/2016 1  % Final  . Eosinophils Absolute 02/11/2016 0.0  0 - 0.7 K/uL Final  . Basophils Relative 02/11/2016 1  % Final  . Basophils Absolute 02/11/2016 0.0  0 - 0.1 K/uL Final  . Sodium 02/11/2016 140  135 - 145 mmol/L Final  . Potassium 02/11/2016 3.2* 3.5 - 5.1 mmol/L Final  . Chloride 02/11/2016 106  101 - 111 mmol/L Final  . CO2 02/11/2016 28  22 - 32 mmol/L Final  . Glucose, Bld 02/11/2016 97  65 - 99 mg/dL Final  . BUN 02/11/2016 11  6 - 20 mg/dL Final  . Creatinine, Ser 02/11/2016 0.77  0.44 - 1.00 mg/dL Final  . Calcium 02/11/2016 8.8* 8.9 - 10.3 mg/dL Final  . Total Protein 02/11/2016 7.6  6.5 - 8.1 g/dL Final  . Albumin 02/11/2016 3.9  3.5 - 5.0 g/dL Final  . AST 02/11/2016 26  15 - 41 U/L Final  . ALT 02/11/2016 13* 14 - 54 U/L Final  . Alkaline Phosphatase 02/11/2016 73  38 - 126 U/L Final  . Total Bilirubin 02/11/2016 0.5  0.3 - 1.2 mg/dL Final  . GFR calc non Af Amer 02/11/2016 >60  >60 mL/min Final  . GFR calc Af Amer 02/11/2016 >60  >60 mL/min Final   Comment: (NOTE) The eGFR has been calculated using the CKD EPI equation. This calculation has not been validated in all clinical situations. eGFR's persistently <60 mL/min signify possible Chronic  Kidney Disease.   . Anion gap 02/11/2016 6  5 - 15 Final  . CEA 02/11/2016 6.5* 0.0 - 4.7 ng/mL Final   Comment: (NOTE)       Roche ECLIA methodology       Nonsmokers  <3.9                                     Smokers     <5.6 Performed At: [facility] [place], [place] [id number] Lindon Romp MD [id number]:[id number]   . CA 27.29 02/11/2016 47.8* 0.0 - 38.6 U/mL Final   Comment: (NOTE) Bayer Centaur/ACS methodology Performed At: [facility] [place], [place] [id number] Lindon Romp MD [id number]   . Vitamin B-12 02/11/2016 231  180 - 914 pg/mL Final   Comment: (NOTE) This assay is not validated for testing neonatal or myeloproliferative syndrome specimens for Vitamin B12 levels. Performed at [facility], [place] [address] [place], [place] [address]   . Free T4 02/11/2016 0.86  0.61 - 1.12 ng/dL Final   Comment: (NOTE) Biotin ingestion may interfere with free T4 tests. If the results are inconsistent with the TSH level, previous test results, or the clinical presentation, then consider biotin interference. If needed, order repeat testing after stopping biotin.   Marland Kitchen TSH 02/11/2016 1.190  0.350 - 4.500 uIU/mL Final    Assessment:  SHASTA CHINN is a 66 y.o. female with a history of left breast cancer (2002), right sided stage III colon cancer (2014), and mild pancytopenia.  She was diagnosed with left breast cancer in 2002.  She underwent  left mastectomy in 03/2000 (no pathology available).  She received Adriamycin and Cytoxan [facility]) which completed in 07/2000 followed by Herceptin and Taxotere in 08/2000.  She did not receive hormonal therapy.  She underwent right sided mastectomy in 02/2008.  CA27.29 was 40.9 on 06/06/2010, 42.6 on 02/04/2011, 35.3 on 03/31/2012, 38.1 on 11/09/2012, 52.3 on 09/17/2015, 50.5 on 10/01/2015, and 47.8 on 02/11/2016.  She has intermittent anemia, neutropenia, and thrombocytopenia since 10/2012.   Bone marrow aspiration and biopsy in 10/2012 was negative for any myelodysplastic syndrome.  Bone marrow revealed no iron storage.  CBC on 09/17/2015 revealed a hematocrit 39.6, hemoglobin 13.3, MCV 96, platelets 128,000, white count 3000 with ANC of 1700. B12 was 266 (low normal) with a normal MMA on 09/19/2015.  Normal studies included:  folate, TSH, free T4.  Ferritin was 20.    She was diagnosed with colon cancer in 12/2012.  She underwent right hemicolectomy on 12/24/2012.  Pathology revealed a 3.7 cm grade II adenocarcinoma which extended through the muscularis propria and into the adjacent adipose.  One of 27 lymph nodes were positive.  Pathologic stage III ([id number]).    She was treated on CALGB protocol with FOLFOX chemotherapy +/- celecoxib.  She withdrew from the study in 02/25/2013.  She described trouble with counts.  She received 6 cycles of FOLFOX (01/28/2013 - 04/29/2013).   Last colonoscopy was in 2015 (due 2018)  CEA was 3.8 on 12/09/2012, 6.3 on 01/27/2013, 10.6 on 05/26/2013, 5.9 on 09/22/2013, 7.1 on 05/10/2014, 7.0 on 11/08/2014, 5.8 on 05/08/2015, 7.2 on 09/17/2015, 5.9 on 10/01/2015, and 6.5 on 02/11/2016.  PET scan on 10/09/2015 revealed no evidence of recurrent disease.  Chest, abdomen, and pelvic CT scan on 02/15/2016 revealed no findings of recurrent malignancy.  There were radiation therapy related opacities in the left lung.  There was prominent stool throughout the colon.  There was lumbar degenerative disc disease.  Left lower extremity duplex on 09/19/2015 revealed no evidence of DVT.  She had a left popliteal fossa Baker's cyst.  Symptomatically, she continues to lose weight.  She is not eating well.  She has been dizzy.  Exam is unremarkable.  She had hypokalemia at last visit.  Plan: 1.  Review labs from labs visit and images since last visit.  PET scan on 10/09/2015 revealed no evidence of disease. 2.  Discuss differential diagnosis of ongoing weight loss as does  not appear related to malignancy.  Discuss labs in AM.  Follow-up with GI. 3.  Labs tomorrow early AM:  BMP, cortisol, ESR, ANA, HIV testing (patient consented). 4.  Contact Dr Marton Redwood office to set up colonoscopy and EGD. 5.  Nutrition consult 6.  RTC in 2-3 weeks for MD assessment.   Lequita Asal, MD  02/25/2016, 10:40 AM

## 2016-02-25 ENCOUNTER — Encounter: Payer: Self-pay | Admitting: Hematology and Oncology

## 2016-02-25 ENCOUNTER — Inpatient Hospital Stay: Payer: Medicare Other | Attending: Hematology and Oncology | Admitting: Hematology and Oncology

## 2016-02-25 VITALS — BP 103/65 | HR 99 | Temp 97.3°F | Resp 18 | Wt 116.8 lb

## 2016-02-25 DIAGNOSIS — Z85038 Personal history of other malignant neoplasm of large intestine: Secondary | ICD-10-CM | POA: Insufficient documentation

## 2016-02-25 DIAGNOSIS — Z9013 Acquired absence of bilateral breasts and nipples: Secondary | ICD-10-CM | POA: Diagnosis not present

## 2016-02-25 DIAGNOSIS — E876 Hypokalemia: Secondary | ICD-10-CM | POA: Diagnosis not present

## 2016-02-25 DIAGNOSIS — Z79899 Other long term (current) drug therapy: Secondary | ICD-10-CM | POA: Insufficient documentation

## 2016-02-25 DIAGNOSIS — D61818 Other pancytopenia: Secondary | ICD-10-CM | POA: Insufficient documentation

## 2016-02-25 DIAGNOSIS — M5136 Other intervertebral disc degeneration, lumbar region: Secondary | ICD-10-CM | POA: Insufficient documentation

## 2016-02-25 DIAGNOSIS — F17210 Nicotine dependence, cigarettes, uncomplicated: Secondary | ICD-10-CM | POA: Diagnosis not present

## 2016-02-25 DIAGNOSIS — E785 Hyperlipidemia, unspecified: Secondary | ICD-10-CM | POA: Insufficient documentation

## 2016-02-25 DIAGNOSIS — Z923 Personal history of irradiation: Secondary | ICD-10-CM | POA: Insufficient documentation

## 2016-02-25 DIAGNOSIS — R634 Abnormal weight loss: Secondary | ICD-10-CM

## 2016-02-25 DIAGNOSIS — C182 Malignant neoplasm of ascending colon: Secondary | ICD-10-CM

## 2016-02-25 DIAGNOSIS — R42 Dizziness and giddiness: Secondary | ICD-10-CM | POA: Diagnosis not present

## 2016-02-25 DIAGNOSIS — H409 Unspecified glaucoma: Secondary | ICD-10-CM | POA: Insufficient documentation

## 2016-02-25 DIAGNOSIS — Z9221 Personal history of antineoplastic chemotherapy: Secondary | ICD-10-CM | POA: Insufficient documentation

## 2016-02-25 DIAGNOSIS — Z853 Personal history of malignant neoplasm of breast: Secondary | ICD-10-CM | POA: Diagnosis not present

## 2016-02-25 NOTE — Progress Notes (Signed)
Patient here today for CT results.  Patient states she is still a little dizzy today.  Patient has an appointment with ENT on Wednesday this week.

## 2016-02-26 ENCOUNTER — Inpatient Hospital Stay: Payer: Medicare Other

## 2016-02-26 DIAGNOSIS — R634 Abnormal weight loss: Secondary | ICD-10-CM

## 2016-02-26 DIAGNOSIS — Z853 Personal history of malignant neoplasm of breast: Secondary | ICD-10-CM | POA: Diagnosis not present

## 2016-02-26 DIAGNOSIS — E876 Hypokalemia: Secondary | ICD-10-CM

## 2016-02-26 LAB — BASIC METABOLIC PANEL
Anion gap: 6 (ref 5–15)
BUN: 11 mg/dL (ref 6–20)
CO2: 28 mmol/L (ref 22–32)
Calcium: 8.8 mg/dL — ABNORMAL LOW (ref 8.9–10.3)
Chloride: 104 mmol/L (ref 101–111)
Creatinine, Ser: 0.88 mg/dL (ref 0.44–1.00)
GFR calc Af Amer: >60 mL/min (ref >60)
GFR calc non Af Amer: >60 mL/min (ref >60)
Glucose, Bld: 94 mg/dL (ref 65–99)
Potassium: 3.9 mmol/L (ref 3.5–5.1)
Sodium: 138 mmol/L (ref 135–145)

## 2016-02-26 LAB — CORTISOL: Cortisol, Plasma: 22.4 ug/dL

## 2016-02-26 LAB — SEDIMENTATION RATE: Sed Rate: 10 mm/hr (ref 0–30)

## 2016-02-27 LAB — ANA W/REFLEX IF POSITIVE: Anti Nuclear Antibody(ANA): NEGATIVE

## 2016-02-27 LAB — HIV ANTIBODY (ROUTINE TESTING W REFLEX): HIV Screen 4th Generation wRfx: NONREACTIVE

## 2016-02-28 ENCOUNTER — Inpatient Hospital Stay: Payer: Medicare Other

## 2016-02-28 NOTE — Progress Notes (Signed)
Nutrition Assessment   Reason for Assessment:   Referral from MD [place] for weight loss.  ASSESSMENT:  66 year old female with history of left breast cancer 2002, right sided colon cancer 2014 with right hemicolectomy, HLD.  Patient reports that she has started losing weight, feeling dizzy. Noted CT performed on 1/26 with no findings of recurrent malignancy.    Patient reports typically eats few peanut butter crackers before going to work at [employer] then has a sandwich for lunch maybe around 2pm when getting off of work (may eat it all or only 1/2). For supper usually has meat and vegetables and dessert.  May snack on individual bag of chips during the day or not.  Reports mostly drinks water during he day.   Reports problems with constipation at times.  No problems chewing or swallowing.  Reports noticed weight loss prior to starting Sliver Sneakers in November 2017.  Was going 1 time per week in November and December but has stopped since the first of the year due to dizziness and "not feeling right"   Nutrition Focused Physical Exam: Nutrition-Focused physical exam completed. Findings are no fat depletion, no muscle depletion, and no edema.  Patient reports more flabby skin since losing weight.  Medications: calcium carbonate, Vit D, KCL, Vit C  Labs: TSH WNL  Anthropometrics:   Height: 62 inches Weight: 116 pound UBW: 137 pounds in August 2017. Reports she has been at that weight for the last 4-5 years BMI: 21 15% weight loss in the last 6 months, significant   Estimated Energy Needs  Kcals: 1325-1600 calories/d Protein: 64 g/d Fluid: 1.6 L/d  NUTRITION DIAGNOSIS: Unintentional weight loss related to inadequate oral intake as evidenced by 15% weight loss in 6 months and eating < or equal to 75% of estimated energy needs for < or equal to 1 month   MALNUTRITION DIAGNOSIS: Patient meets criteria for severe malnutrition in chronic illness as evidenced by 15% weight loss in  6 months and eating < or equal to 75% of estimated energy needs for < or equal to 1 month.   INTERVENTION:   Discussed importance of increasing calories and protein to prevent further weight loss and gain weight.  Discussed ways to add calories and protein to current typical day. Handout provided.  Discussed heart healthy fats as patient concerned about cholesterol level. Discussed importance of small frequent meals and discussed snacks that patient could take with her to work. Patient reports that she her supervisor is working with her to make sure she is getting a break during work hours and will take high calorie, high protein snacks. Discussed oral nutrition supplement options.  Does not like boost, reports it causes constipation.  Provided samples of ensure and boost breeze (juice type supplement). A Discussed strategies to prevent constipation as this could effect appetite as well. Encouraged patient to remain as physically active as she safely can and that MD will allow to maintain muscle mass Contact information provided if has further questions or concerns can call    MONITORING, EVALUATION, GOAL: Patient will consume adequate calories and protein to prevent further weight loss and hopefully gain weight   NEXT VISIT: March 22 at [facility]. [name], [place], [place] (pager)

## 2016-03-16 NOTE — Progress Notes (Signed)
[facility] [facility] day:  03/17/2016  Chief Complaint: Amy Bird is a 66 y.o. female with a history of left breast cancer (2002), right sided colon cancer (2014), mild pancytopenia, and unexplained weight loss  who is seen for 3 week reassessment.  HPI:  The patient was last seen in the medical oncology clinic on 02/25/2016.  At that time, she was eating poorly and losing weight.  CT scans revealed no evidence of recurrent disease.  Additional labs were drawn.  She was referred to nutrition and GI.  Labs on 02/26/2016 revealed a normal cortisol, sed rate, and ANA.  HIV testing was negative.  Potassium was normal after supplementation.    She saw nutrition on 02/28/2016.  She was noted to have a 15% weight loss in 6 months and eating < or equal to 75% of estimated energy needs for < or equal to 1 month.  Numerous suggestions were provided.  At last visit, she felt dizzy and had been referred to ENT by Dr. Glendon Axe.  She was diagnosed with vertigo.  She states that my  "right-sided crystals were out".  Vertigo has resolved.  Symptomatically, she is eating more. Her appetite is better. She is drinking Ensure. She takes MVI which give her energy.  She has an appointment with GI on 03/25/2016.  She usually wears a size 8 and is now a size 6.  Years ago, she weighed 110 pounds and then gained 50 pounds when she was put on an antidepressant medication. She has slowly lost weight over time.   Past Medical History:  Diagnosis Date  . Abdominal pain 2013  . Anemia   . Arthritis   . Bleeding nose   . Blood in urine   . Breast cancer, left breast ([place]) 2002   Left Mastectomy, chemo + Rad tx's.  . Breast cyst   . Bronchitis   . Bruises easily   . Colon cancer ([place]) 10/2012   Partial colon resection and chemo tx's.  . Colon cancer ([place])   . Difficulty urinating   . Diverticulitis   . Glaucoma   . Hearing loss   . Hyperlipidemia 20 years  . Kidney  stone   . Neuromuscular disorder ([place])    neuropathy  feet and hands from chemo tx  . Pneumonia   . Shingles   . Swollen lymph nodes     Past Surgical History:  Procedure Laterality Date  . ABDOMINAL HYSTERECTOMY  1973  . APPENDECTOMY  1969  . CATARACT EXTRACTION W/PHACO Left 07/11/2015   Procedure: CATARACT EXTRACTION PHACO AND INTRAOCULAR LENS PLACEMENT ([place]) left eye;  Surgeon: Leandrew Koyanagi, MD;  Location: [place] [facility];  Service: Ophthalmology;  Laterality: Left;  . CESAREAN SECTION    . COLON RESECTION    . COLONOSCOPY  2004  . kidney stone removal     . MASTECTOMY Bilateral 2002,2010  . salpingo oophorectmy   1973    Family History  Problem Relation Age of Onset  . Hypertension Mother   . Alzheimer's disease Mother   . Cancer Father     prostate  . Cancer Sister     breast  . Hypertension Sister   . Cancer Brother     stomach  . Hypertension Brother   . Sarcoidosis Other     positive family history.  . Cancer Sister     breast  . Cancer Sister     breast    Social History:  reports that she has quit smoking. Her smoking use included Cigarettes. She has a 52.50 pack-year smoking history. She has never used smokeless tobacco. She reports that she does not drink alcohol or use drugs.  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  She quit smoking in 2003.  She lives in [place].  She belongs to the [employer].  The patient is alone today.  Allergies:  Allergies  Allergen Reactions  . Sulfa Antibiotics Swelling  . Hydrocodone-Ibuprofen Other (See Comments)    Patient feels like there is a heavy wight on her chest  . Statins Other (See Comments)    Leg cramps and groin pain  . Other Itching    Tree Nuts  . Tape Rash    Current Medications: Current Outpatient Prescriptions  Medication Sig Dispense Refill  . Calcium Carbonate-Vit D-Min (CALCIUM 1200 PO) Take 1 tablet by mouth daily. Reported on 07/09/2015    . latanoprost (XALATAN) 0.005 %  ophthalmic solution Place 1 drop into both eyes at bedtime.    . vitamin C (ASCORBIC ACID) 500 MG tablet Take 500 mg by mouth daily.     No current facility-administered medications for this visit.     Review of Systems:  GENERAL:  Feels "better".  No fevers or sweats.  Weight loss of 1 pound since last visit (see HPI). PERFORMANCE STATUS (ECOG):  1 HEENT:  No visual changes, runny nose, sore throat, mouth sores or tenderness. Lungs: No shortness of breath or cough.  No hemoptysis. Cardiac:  No chest pain, palpitations, orthopnea, or PND. GI:  Eating better.  Drinking Ensure.  No nausea, vomiting, diarrhea, constipation, melena or hematochezia. GU:  No urgency, frequency, dysuria, or hematuria. Musculoskeletal:  No back pain.  No joint pain.  No muscle tenderness. Extremities:  Toes dark after wearing shoes. Skin:  No rashes or skin changes. Neuro:  Vertigo, resolved.  No headache, numbness or weakness, balance or coordination issues. Endocrine:  No diabetes, thyroid issues, hot flashes or night sweats. Psych:  No mood changes, depression or anxiety.  Poor sleep. Pain:  No focal pain. Review of systems:  All other systems reviewed and found to be negative.  Physical Exam:  Blood pressure 108/70, pulse 88, temperature 97.1 F (36.2 C), temperature source Tympanic, resp. rate 18, weight 115 lb 4.8 oz (52.3 kg). GENERAL:  Well developed, well nourished, woman sitting comfortably in the exam room in no acute distress. MENTAL STATUS:  Alert and oriented to person, place and time. HEAD:  Brown straight jaw length hair.  Normocephalic, atraumatic, face symmetric, no Cushingoid features. EYES:  Glasses.  Brown eyes.  Pupils equal round and reactive to light and accomodation.  No conjunctivitis or scleral icterus. ENT:  Oropharynx clear without lesion.  Tongue normal. Mucous membranes moist.  RESPIRATORY:  Clear to auscultation without rales, wheezes or rhonchi. CARDIOVASCULAR:  Regular rate  and rhythm without murmur, rub or gallop. ABDOMEN:  Soft, non-tender, with active bowel sounds, and no hepatosplenomegaly.  No masses. SKIN:  No rashes, ulcers or lesions. EXTREMITIES: Wearing a left sided lymphedema sleeve.  No skin discoloration or tenderness.  No palpable cords. LYMPH NODES: No palpable cervical, supraclavicular, axillary or inguinal adenopathy  NEUROLOGICAL: Unremarkable. Stumble a little bit. PSYCH:  Appropriate.   No visits with results within 3 Day(s) from this visit.  Latest known visit with results is:  Appointment on 02/26/2016  Component Date Value Ref Range Status  . Sodium 02/26/2016 138  135 - 145 mmol/L Final  .  Potassium 02/26/2016 3.9  3.5 - 5.1 mmol/L Final  . Chloride 02/26/2016 104  101 - 111 mmol/L Final  . CO2 02/26/2016 28  22 - 32 mmol/L Final  . Glucose, Bld 02/26/2016 94  65 - 99 mg/dL Final  . BUN 02/26/2016 11  6 - 20 mg/dL Final  . Creatinine, Ser 02/26/2016 0.88  0.44 - 1.00 mg/dL Final  . Calcium 02/26/2016 8.8* 8.9 - 10.3 mg/dL Final  . GFR calc non Af Amer 02/26/2016 >60  >60 mL/min Final  . GFR calc Af Amer 02/26/2016 >60  >60 mL/min Final   Comment: (NOTE) The eGFR has been calculated using the CKD EPI equation. This calculation has not been validated in all clinical situations. eGFR's persistently <60 mL/min signify possible Chronic Kidney Disease.   . Anion gap 02/26/2016 6  5 - 15 Final  . Cortisol, Plasma 02/26/2016 22.4  ug/dL Final   Comment: (NOTE) AM    6.7 - 22.6 ug/dL PM   <10.0       ug/dL Performed at [facility] [address], [place], [place] [address]   . Sed Rate 02/26/2016 10  0 - 30 mm/hr Final  . Anit Nuclear Antibody(ANA) 02/26/2016 Negative  Negative Final   Comment: (NOTE) Performed At: [facility] [place], [place] [id number] Lindon Romp MD JT:[id number]   . HIV Screen 4th Generation wRfx 02/26/2016 Non Reactive  Non Reactive Final   Comment: (NOTE) Performed  At: [facility] [address] [place], [place] [id number] Lindon Romp MD MA:[id number]     Assessment:  Amy Bird is a 66 y.o. female with a history of left breast cancer (2002), right sided stage III colon cancer (2014), and mild pancytopenia.  She was diagnosed with left breast cancer in 2002.  She underwent left mastectomy in 03/2000 (no pathology available).  She received Adriamycin and Cytoxan [facility]) which completed in 07/2000 followed by Herceptin and Taxotere in 08/2000.  She did not receive hormonal therapy.  She underwent right sided mastectomy in 02/2008.  CA27.29 was 40.9 on 06/06/2010, 42.6 on 02/04/2011, 35.3 on 03/31/2012, 38.1 on 11/09/2012, 52.3 on 09/17/2015, 50.5 on 10/01/2015, and 47.8 on 02/11/2016.  She has intermittent anemia, neutropenia, and thrombocytopenia since 10/2012.  Bone marrow aspiration and biopsy in 10/2012 was negative for any myelodysplastic syndrome.  Bone marrow revealed no iron storage.  CBC on 09/17/2015 revealed a hematocrit 39.6, hemoglobin 13.3, MCV 96, platelets 128,000, white count 3000 with ANC of 1700. B12 was 266 (low normal) with a normal MMA on 09/19/2015.  Normal studies included:  folate, TSH, free T4.  Ferritin was 20.    She was diagnosed with colon cancer in 12/2012.  She underwent right hemicolectomy on 12/24/2012.  Pathology revealed a 3.7 cm grade II adenocarcinoma which extended through the muscularis propria and into the adjacent adipose.  One of 27 lymph nodes were positive.  Pathologic stage III ([id number]).    She was treated on CALGB protocol with FOLFOX chemotherapy +/- celecoxib.  She withdrew from the study in 02/25/2013.  She described trouble with counts.  She received 6 cycles of FOLFOX (01/28/2013 - 04/29/2013).   Last colonoscopy was in 2015 (due 2018)  CEA was 3.8 on 12/09/2012, 6.3 on 01/27/2013, 10.6 on 05/26/2013, 5.9 on 09/22/2013, 7.1 on 05/10/2014, 7.0 on 11/08/2014, 5.8 on 05/08/2015, 7.2 on 09/17/2015,  5.9 on 10/01/2015, and 6.5 on 02/11/2016.  PET scan on 10/09/2015 revealed no evidence of recurrent disease.  Chest, abdomen, and pelvic CT scan on 02/15/2016 revealed no findings of recurrent malignancy.  There were radiation therapy related opacities in the left lung.  There was prominent stool throughout the colon.  There was lumbar degenerative disc disease.  She notes gradual weight loss since discontinuation of an anti-depressant years ago.  She previously weighed 110 pounds. TSH and free T4 were normal on 02/11/2016.  Labs on 02/26/2016 revealed a normal cortisol, sed rate, and ANA.  HIV testing was negative.  Left lower extremity duplex on 09/19/2015 revealed no evidence of DVT.  She had a left popliteal fossa Baker's cyst.  Symptomatically, she feels better.  Vertigo has resolved.  She is eating better.  Exam is unremarkable.   Plan: 1.  Review labs and scans.  No evidence of disease. 2.  Discuss Megace.  Patient declines. 3.  Discuss keeping a food diary and weight checks weekly at home.  She is to call if weight drops. 4.  Follow-up with GI on 03/25/2016. 5.  RTC monthly for weight check. 6.  RTC in 3 months for MD assessment and labs (CBC with diff, CMP, CA27.29, CEA, TSH, free T4).   Lequita Asal, MD  03/17/2016, 9:33 AM

## 2016-03-17 ENCOUNTER — Ambulatory Visit: Payer: Medicare Other | Admitting: Hematology and Oncology

## 2016-03-17 ENCOUNTER — Encounter: Payer: Self-pay | Admitting: Hematology and Oncology

## 2016-03-17 ENCOUNTER — Inpatient Hospital Stay (HOSPITAL_BASED_OUTPATIENT_CLINIC_OR_DEPARTMENT_OTHER): Payer: Medicare Other | Admitting: Hematology and Oncology

## 2016-03-17 ENCOUNTER — Other Ambulatory Visit: Payer: Medicare Other

## 2016-03-17 VITALS — BP 108/70 | HR 88 | Temp 97.1°F | Resp 18 | Wt 115.3 lb

## 2016-03-17 DIAGNOSIS — Z923 Personal history of irradiation: Secondary | ICD-10-CM

## 2016-03-17 DIAGNOSIS — Z85038 Personal history of other malignant neoplasm of large intestine: Secondary | ICD-10-CM | POA: Diagnosis not present

## 2016-03-17 DIAGNOSIS — D61818 Other pancytopenia: Secondary | ICD-10-CM | POA: Diagnosis not present

## 2016-03-17 DIAGNOSIS — E876 Hypokalemia: Secondary | ICD-10-CM | POA: Diagnosis not present

## 2016-03-17 DIAGNOSIS — Z9221 Personal history of antineoplastic chemotherapy: Secondary | ICD-10-CM

## 2016-03-17 DIAGNOSIS — R42 Dizziness and giddiness: Secondary | ICD-10-CM

## 2016-03-17 DIAGNOSIS — Z853 Personal history of malignant neoplasm of breast: Secondary | ICD-10-CM | POA: Diagnosis not present

## 2016-03-17 DIAGNOSIS — Z9013 Acquired absence of bilateral breasts and nipples: Secondary | ICD-10-CM

## 2016-03-17 DIAGNOSIS — C182 Malignant neoplasm of ascending colon: Secondary | ICD-10-CM

## 2016-03-17 DIAGNOSIS — R634 Abnormal weight loss: Secondary | ICD-10-CM

## 2016-03-17 NOTE — Progress Notes (Signed)
Patient is here for follow up, she is doing well she does have some constipation issues.

## 2016-04-03 ENCOUNTER — Encounter: Payer: Self-pay | Admitting: *Deleted

## 2016-04-04 ENCOUNTER — Encounter
Admission: RE | Disposition: A | Discharge status: Home / Self Care | Payer: Self-pay | Source: Ambulatory Visit | Attending: Gastroenterology

## 2016-04-04 ENCOUNTER — Ambulatory Visit
Admission: RE | Admit: 2016-04-04 | Discharge: 2016-04-04 | Disposition: A | Discharge status: Home / Self Care | Payer: Medicare Other | Source: Ambulatory Visit | Attending: Gastroenterology | Admitting: Gastroenterology

## 2016-04-04 ENCOUNTER — Ambulatory Visit: Payer: Medicare Other | Admitting: Anesthesiology

## 2016-04-04 DIAGNOSIS — Z9012 Acquired absence of left breast and nipple: Secondary | ICD-10-CM | POA: Insufficient documentation

## 2016-04-04 DIAGNOSIS — K5909 Other constipation: Secondary | ICD-10-CM | POA: Diagnosis not present

## 2016-04-04 DIAGNOSIS — R634 Abnormal weight loss: Secondary | ICD-10-CM | POA: Insufficient documentation

## 2016-04-04 DIAGNOSIS — E785 Hyperlipidemia, unspecified: Secondary | ICD-10-CM | POA: Insufficient documentation

## 2016-04-04 DIAGNOSIS — Z841 Family history of disorders of kidney and ureter: Secondary | ICD-10-CM | POA: Insufficient documentation

## 2016-04-04 DIAGNOSIS — Z6821 Body mass index (BMI) 21.0-21.9, adult: Secondary | ICD-10-CM | POA: Diagnosis not present

## 2016-04-04 DIAGNOSIS — Z79899 Other long term (current) drug therapy: Secondary | ICD-10-CM | POA: Insufficient documentation

## 2016-04-04 DIAGNOSIS — Z853 Personal history of malignant neoplasm of breast: Secondary | ICD-10-CM | POA: Insufficient documentation

## 2016-04-04 DIAGNOSIS — K2210 Ulcer of esophagus without bleeding: Secondary | ICD-10-CM | POA: Diagnosis not present

## 2016-04-04 DIAGNOSIS — Z87442 Personal history of urinary calculi: Secondary | ICD-10-CM | POA: Insufficient documentation

## 2016-04-04 DIAGNOSIS — Z91048 Other nonmedicinal substance allergy status: Secondary | ICD-10-CM | POA: Insufficient documentation

## 2016-04-04 DIAGNOSIS — D125 Benign neoplasm of sigmoid colon: Secondary | ICD-10-CM | POA: Insufficient documentation

## 2016-04-04 DIAGNOSIS — Z90710 Acquired absence of both cervix and uterus: Secondary | ICD-10-CM | POA: Diagnosis not present

## 2016-04-04 DIAGNOSIS — K3189 Other diseases of stomach and duodenum: Secondary | ICD-10-CM | POA: Diagnosis not present

## 2016-04-04 DIAGNOSIS — H409 Unspecified glaucoma: Secondary | ICD-10-CM | POA: Insufficient documentation

## 2016-04-04 DIAGNOSIS — K2970 Gastritis, unspecified, without bleeding: Secondary | ICD-10-CM | POA: Insufficient documentation

## 2016-04-04 DIAGNOSIS — Z9221 Personal history of antineoplastic chemotherapy: Secondary | ICD-10-CM | POA: Insufficient documentation

## 2016-04-04 DIAGNOSIS — B9681 Helicobacter pylori [H. pylori] as the cause of diseases classified elsewhere: Secondary | ICD-10-CM | POA: Insufficient documentation

## 2016-04-04 DIAGNOSIS — Z85038 Personal history of other malignant neoplasm of large intestine: Secondary | ICD-10-CM | POA: Insufficient documentation

## 2016-04-04 DIAGNOSIS — Z8042 Family history of malignant neoplasm of prostate: Secondary | ICD-10-CM | POA: Insufficient documentation

## 2016-04-04 DIAGNOSIS — Z885 Allergy status to narcotic agent status: Secondary | ICD-10-CM | POA: Diagnosis not present

## 2016-04-04 DIAGNOSIS — Z980 Intestinal bypass and anastomosis status: Secondary | ICD-10-CM | POA: Insufficient documentation

## 2016-04-04 DIAGNOSIS — Z882 Allergy status to sulfonamides status: Secondary | ICD-10-CM | POA: Diagnosis not present

## 2016-04-04 DIAGNOSIS — Z803 Family history of malignant neoplasm of breast: Secondary | ICD-10-CM | POA: Insufficient documentation

## 2016-04-04 DIAGNOSIS — Z888 Allergy status to other drugs, medicaments and biological substances status: Secondary | ICD-10-CM | POA: Insufficient documentation

## 2016-04-04 DIAGNOSIS — Z820 Family history of epilepsy and other diseases of the nervous system: Secondary | ICD-10-CM | POA: Diagnosis not present

## 2016-04-04 DIAGNOSIS — Z800 Family history of malignant neoplasm of digestive organs: Secondary | ICD-10-CM | POA: Insufficient documentation

## 2016-04-04 DIAGNOSIS — Z87891 Personal history of nicotine dependence: Secondary | ICD-10-CM | POA: Insufficient documentation

## 2016-04-04 HISTORY — PX: COLONOSCOPY: SHX5424

## 2016-04-04 HISTORY — PX: ESOPHAGOGASTRODUODENOSCOPY: SHX5428

## 2016-04-04 SURGERY — COLONOSCOPY
Anesthesia: General

## 2016-04-04 MED ORDER — PROPOFOL 500 MG/50ML IV EMUL
INTRAVENOUS | Status: AC
  Filled 2016-04-04: qty 50

## 2016-04-04 MED ORDER — SODIUM CHLORIDE 0.9 % IV SOLN: INTRAVENOUS | Status: DC

## 2016-04-04 MED ORDER — PROPOFOL 500 MG/50ML IV EMUL
INTRAVENOUS | Status: DC | PRN
  Administered 2016-04-04: 120 ug/kg/min via INTRAVENOUS

## 2016-04-04 MED ORDER — FENTANYL CITRATE (PF) 100 MCG/2ML IJ SOLN
INTRAMUSCULAR | Status: DC | PRN
  Administered 2016-04-04: 100 ug via INTRAVENOUS

## 2016-04-04 MED ORDER — FENTANYL CITRATE (PF) 100 MCG/2ML IJ SOLN
INTRAMUSCULAR | Status: AC
  Filled 2016-04-04: qty 2

## 2016-04-04 MED ORDER — SODIUM CHLORIDE 0.9 % IV SOLN
INTRAVENOUS | Status: DC
  Administered 2016-04-04: 1000 mL via INTRAVENOUS

## 2016-04-04 MED ORDER — LIDOCAINE HCL (PF) 2 % IJ SOLN
INTRAMUSCULAR | Status: AC
  Filled 2016-04-04: qty 2

## 2016-04-04 MED ORDER — PROPOFOL 10 MG/ML IV BOLUS
INTRAVENOUS | Status: DC | PRN
  Administered 2016-04-04: 40 mg via INTRAVENOUS

## 2016-04-04 MED ORDER — PHENYLEPHRINE HCL 10 MG/ML IJ SOLN
INTRAMUSCULAR | Status: DC | PRN
  Administered 2016-04-04: 100 ug via INTRAVENOUS

## 2016-04-04 MED ORDER — LIDOCAINE HCL (CARDIAC) 20 MG/ML IV SOLN
INTRAVENOUS | Status: DC | PRN
  Administered 2016-04-04: 2 mL via INTRAVENOUS

## 2016-04-04 NOTE — Transfer of Care (Signed)
Immediate Anesthesia Transfer of Care Note  Patient: Amy Bird  Procedure(s) Performed: Procedure(s): COLONOSCOPY (N/A) ESOPHAGOGASTRODUODENOSCOPY (EGD) (N/A)  Patient Location: PACU  Anesthesia Type:General  Level of Consciousness: awake  Airway & Oxygen Therapy: Patient Spontanous Breathing  Post-op Assessment: Report given to RN and Post -op Vital signs reviewed and stable  Post vital signs: Reviewed  Last Vitals:  Vitals:   04/04/16 0856  BP: 110/64  Pulse: 91  Resp: 16  Temp: (!) 36.1 C    Last Pain:  Vitals:   04/04/16 0856  TempSrc: Tympanic  PainSc: 0-No pain         Complications: No apparent anesthesia complications

## 2016-04-04 NOTE — Anesthesia Postprocedure Evaluation (Signed)
Anesthesia Post Note  Patient: LILU MCGLOWN  Procedure(s) Performed: Procedure(s) (LRB): COLONOSCOPY (N/A) ESOPHAGOGASTRODUODENOSCOPY (EGD) (N/A)  Patient location during evaluation: PACU Anesthesia Type: General Level of consciousness: awake Pain management: pain level controlled Vital Signs Assessment: post-procedure vital signs reviewed and stable Respiratory status: spontaneous breathing Cardiovascular status: stable Anesthetic complications: no     Last Vitals:  Vitals:   04/04/16 1109 04/04/16 1119  BP: 112/65 119/67  Pulse: 66 64  Resp: 10 15  Temp:      Last Pain:  Vitals:   04/04/16 1049  TempSrc: Tympanic  PainSc:                  VAN STAVEREN,Conchetta Lamia

## 2016-04-04 NOTE — Op Note (Signed)
[facility] Gastroenterology Patient Name: Amy Bird Procedure Date: 04/04/2016 9:57 AM MRN: [id number] Account #: [id number] Date of Birth: 11/05/1950 Admit Type: Outpatient Age: 66 Room: [facility] ENDO ROOM 1 Gender: Female Note Status: Finalized Procedure:            Upper GI endoscopy Indications:          Weight loss Providers:            Lollie Sails, MD Referring MD:         Glendon Axe (Referring MD) Medicines:            Monitored Anesthesia Care Complications:        No immediate complications. Procedure:            Pre-Anesthesia Assessment:                       - ASA Grade Assessment: III - A patient with severe                        systemic disease.                       After obtaining informed consent, the endoscope was                        passed under direct vision. Throughout the procedure,                        the patient's blood pressure, pulse, and oxygen                        saturations were monitored continuously. The Endoscope                        was introduced through the mouth, and advanced to the                        third part of duodenum. The upper GI endoscopy was                        accomplished without difficulty. The patient tolerated                        the procedure well. Findings:      LA Grade A (one or more mucosal breaks less than 5 mm, not extending       between tops of 2 mucosal folds) esophagitis with no bleeding was found.       Biopsies were taken with a cold forceps for histology.      The exam of the esophagus was otherwise normal.      A single localized, 5 mm non-bleeding erosion was found in the       prepyloric region of the stomach. There were no stigmata of recent       bleeding. Biopsies were taken with a cold forceps for histology.      Diffuse minimal inflammation characterized by erythema was found in the       gastric body. Biopsies were taken with a cold forceps for histology.       Biopsies were taken with a cold forceps for Helicobacter pylori testing.      Patchy mild  inflammation characterized by erythema was found in the       duodenal bulb.      The exam of the duodenum was otherwise normal.      The cardia and gastric fundus were normal on retroflexion. Impression:           - LA Grade A erosive esophagitis. Biopsied.                       - Non-bleeding erosive gastropathy. Biopsied.                       - Gastritis. Biopsied.                       - Duodenitis. Recommendation:       - Await pathology results.                       - Use Protonix (pantoprazole) 40 mg PO daily.                       - Repeat upper endoscopy in 2 months to check healing. Procedure Code(s):    --- Professional ---                       [phone], Esophagogastroduodenoscopy, flexible, transoral;                        with biopsy, single or multiple Diagnosis Code(s):    --- Professional ---                       K20.8, Other esophagitis                       K31.89, Other diseases of stomach and duodenum                       K29.70, Gastritis, unspecified, without bleeding                       K29.80, Duodenitis without bleeding                       R63.4, Abnormal weight loss CPT copyright 2016 American Medical Association. All rights reserved. The codes documented in this report are preliminary and upon coder review may  be revised to meet current compliance requirements. Lollie Sails, MD 04/04/2016 10:17:50 AM This report has been signed electronically. Number of Addenda: 0 Note Initiated On: 04/04/2016 9:57 AM      [facility]

## 2016-04-04 NOTE — Op Note (Signed)
[facility] Gastroenterology Patient Name: Noelani Harbach Procedure Date: 04/04/2016 9:56 AM MRN: [id number] Account #: [id number] Date of Birth: 1950-01-29 Admit Type: Outpatient Age: 66 Room: [facility] ENDO ROOM 1 Gender: Female Note Status: Finalized Procedure:            Colonoscopy Indications:          Weight loss Providers:            Lollie Sails, MD Referring MD:         Glendon Axe (Referring MD) Medicines:            Monitored Anesthesia Care Complications:        No immediate complications. Procedure:            Pre-Anesthesia Assessment:                       - ASA Grade Assessment: III - A patient with severe                        systemic disease.                       After obtaining informed consent, the colonoscope was                        passed under direct vision. Throughout the procedure,                        the patient's blood pressure, pulse, and oxygen                        saturations were monitored continuously. The                        Colonoscope was introduced through the anus and                        advanced to the the ileocolonic anastomosis. The                        colonoscopy was performed without difficulty. The                        patient tolerated the procedure well. The quality of                        the bowel preparation was good. Findings:      A 2 mm polyp was found in the distal sigmoid colon. The polyp was       sessile. The polyp was removed with a cold biopsy forceps. Resection and       retrieval were complete.      There was evidence of a prior functional end-to-end ileo-colonic       anastomosis in the proximal transverse colon. This was patent and was       characterized by healthy appearing mucosa.      The exam was otherwise without abnormality.      The digital rectal exam was normal. Impression:           - One 2 mm polyp in the distal sigmoid colon, removed  with a  cold biopsy forceps. Resected and retrieved.                       - Patent functional end-to-end ileo-colonic                        anastomosis, characterized by healthy appearing mucosa.                       - The examination was otherwise normal. Recommendation:       - Discharge patient to home.                       - Advance diet as tolerated. Procedure Code(s):    --- Professional ---                       [id number], Colonoscopy, flexible; with biopsy, single or                        multiple Diagnosis Code(s):    --- Professional ---                       D12.5, Benign neoplasm of sigmoid colon                       Z98.0, Intestinal bypass and anastomosis status                       R63.4, Abnormal weight loss CPT copyright 2016 American Medical Association. All rights reserved. The codes documented in this report are preliminary and upon coder review may  be revised to meet current compliance requirements. Lollie Sails, MD 04/04/2016 10:44:43 AM This report has been signed electronically. Number of Addenda: 0 Note Initiated On: 04/04/2016 9:56 AM Scope Withdrawal Time: 0 hours 6 minutes 36 seconds  Total Procedure Duration: 0 hours 20 minutes 17 seconds       [facility]

## 2016-04-04 NOTE — Anesthesia Post-op Follow-up Note (Signed)
Anesthesia QCDR form completed.        

## 2016-04-04 NOTE — H&P (Signed)
Outpatient short stay form Pre-procedure 04/04/2016 9:45 AM Amy Sails MD  Primary Physician: Dr. Glendon Axe  Reason for visit:  EGD and colonoscopy  History of present illness:  Patient is a 66 year old female presenting today as above. She has a personal history of colon cancer found on colonoscopy 12/02/2012. She subsequently underwent a right hemicolectomy. She has been losing about 2025 pounds over the past several months. She has no pacific symptom of a GI nature. She does have a history of some chronic constipation. She tolerated her prep well. She takes no aspirin or blood thinning agents.    Current Facility-Administered Medications:  .  0.9 %  sodium chloride infusion, , Intravenous, Continuous, Amy Sails, MD, Last Rate: 20 mL/hr at 04/04/16 0910, 1,000 mL at 04/04/16 0910 .  0.9 %  sodium chloride infusion, , Intravenous, Continuous, Amy Sails, MD  Prescriptions Prior to Admission  Medication Sig Dispense Refill Last Dose  . Calcium Carbonate-Vit D-Min (CALCIUM 1200 PO) Take 1 tablet by mouth daily. Reported on 07/09/2015   Past Week at Unknown time  . gabapentin (NEURONTIN) 100 MG capsule Take 100 mg by mouth 3 (three) times daily.   Past Month at Unknown time  . latanoprost (XALATAN) 0.005 % ophthalmic solution Place 1 drop into both eyes at bedtime.   04/03/2016 at Unknown time  . vitamin C (ASCORBIC ACID) 500 MG tablet Take 500 mg by mouth daily.   Past Week at Unknown time  . hydrochlorothiazide (MICROZIDE) 12.5 MG capsule Take 12.5 mg by mouth daily.   Not Taking at Unknown time  . meloxicam (MOBIC) 15 MG tablet Take 15 mg by mouth daily.   Completed Course at Unknown time  . nepafenac (NEVANAC) 0.1 % ophthalmic suspension 1 drop 3 (three) times daily.   Completed Course at Unknown time     Allergies  Allergen Reactions  . Sulfa Antibiotics Swelling  . Hydrocodone-Ibuprofen Other (See Comments)    Patient feels like there is a heavy wight on her  chest  . Statins Other (See Comments)    Leg cramps and groin pain  . Other Itching    Tree Nuts  . Tape Rash     Past Medical History:  Diagnosis Date  . Abdominal pain 2013  . Anemia   . Arthritis   . Bleeding nose   . Blood in urine   . Breast cancer, left breast ([place]) 2002   Left Mastectomy, chemo + Rad tx's.  . Breast cyst   . Bronchitis   . Bruises easily   . Colon cancer ([place]) 10/2012   Partial colon resection and chemo tx's.  . Colon cancer ([place])   . Difficulty urinating   . Diverticulitis   . Glaucoma   . Hearing loss   . Hyperlipidemia 20 years  . Kidney stone   . Neuromuscular disorder ([place])    neuropathy  feet and hands from chemo tx  . Pneumonia   . Shingles   . Swollen lymph nodes     Review of systems:      Physical Exam    Heart and lungs: Regular rate and rhythm without rub or gallop, lungs are bilaterally clear.    HEENT: Normocephalic atraumatic eyes are anicteric    Other:     Pertinant exam for procedure: Soft nontender nondistended bowel sounds positive normoactive.    Planned proceedures: EGD, colonoscopy and indicated procedures. I have discussed the risks benefits and complications of procedures to include not  limited to bleeding, infection, perforation and the risk of sedation and the patient wishes to proceed.    Amy Sails, MD Gastroenterology 04/04/2016  9:45 AM

## 2016-04-04 NOTE — Anesthesia Preprocedure Evaluation (Signed)
Anesthesia Evaluation  Patient identified by MRN, date of birth, ID band Patient awake    Reviewed: Allergy & Precautions, NPO status , Patient's Chart, lab work & pertinent test results  Airway Mallampati: II       Dental  (+) Teeth Intact   Pulmonary former smoker,    Pulmonary exam normal        Cardiovascular Exercise Tolerance: Good  Rhythm:Regular Rate:Normal     Neuro/Psych    GI/Hepatic negative GI ROS, Neg liver ROS,   Endo/Other  negative endocrine ROS  Renal/GU   negative genitourinary   Musculoskeletal   Abdominal Normal abdominal exam  (+)   Peds negative pediatric ROS (+)  Hematology  (+) anemia ,   Anesthesia Other Findings   Reproductive/Obstetrics                             Anesthesia Physical Anesthesia Plan  ASA: II  Anesthesia Plan: General   Post-op Pain Management:    Induction: Intravenous  Airway Management Planned: Natural Airway and Nasal Cannula  Additional Equipment:   Intra-op Plan:   Post-operative Plan:   Informed Consent: I have reviewed the patients History and Physical, chart, labs and discussed the procedure including the risks, benefits and alternatives for the proposed anesthesia with the patient or authorized representative who has indicated his/her understanding and acceptance.     Plan Discussed with: Surgeon  Anesthesia Plan Comments:         Anesthesia Quick Evaluation

## 2016-04-07 ENCOUNTER — Encounter: Payer: Self-pay | Admitting: Gastroenterology

## 2016-04-07 LAB — SURGICAL PATHOLOGY

## 2016-04-10 ENCOUNTER — Inpatient Hospital Stay: Payer: Medicare Other | Attending: Hematology and Oncology

## 2016-04-10 NOTE — Progress Notes (Addendum)
Nutrition Follow-up:  Nutrition follow-up completed in clinic this am.    Patient reports "I am eating more than I ever have eaten."  Recently had EGD and coloscopy on 3/16.  Patient reports that they found "erosion" in esophagus, mass in the stomach and removed a benign polyp in the colon.  Patient reports biopsies were taken but she does not know the results.  Planning to see MD Mike Gip on 3/26.   Patient reports that she eats peanut butter crackers or cereal or egg for breakfast about 9:00am, then has sandwich with chips for lunch, then dinner has meat and vegetables with dried beans at times.  Reports that she has drank the ensure enlive but she is not a big fan of it.  Not drinking ensure every day.  Reports some issues with constipation in the past but seems to be better    Medications: pantoprazole 46ml q daily added to regimen after EGD  Labs: reviewed  Anthropometrics:   Weight today in clinic 114 lb 2 oz decreased from 116 pounds on 2/8 visit and noted 115 pounds on 3/16 visit.     NUTRITION DIAGNOSIS: Unintentional weight loss continues   MALNUTRITION DIAGNOSIS: Severe malnutrition continues   INTERVENTION:   Provided patients with recipes of high calorie, high protein shakes that she can try.  Recipe book also given with additional recipes.  Encouraged intake of at least 1-2 shakes per day Encouraged frequent meals/snacks. Discussed how this would look like with typical intake (ie breakfast at 9-9:30, snack at 11am, lunch at 1:30-2pm, supper at 4-5pm and bedtime snack at 7:30-8pm) Provided additional constipation fact sheet for patient. Patient to follow-up with MD regarding medication regimen to help with constipation.  Noted patient has declined appetite stimulant in the past Encouraged patient to keep food diary for few days prior to April visit for [place] to review.    MONITORING, EVALUATION, GOAL: Patient will consume adequate calories and protein to prevent further  weight loss and hopefully gain weight   NEXT VISIT: April 23rd after clinic visit  Saidah Kempton B. [name], [place], [place] Registered Dietitian [phone] (pager)

## 2016-04-14 ENCOUNTER — Telehealth: Payer: Self-pay | Admitting: *Deleted

## 2016-04-14 ENCOUNTER — Inpatient Hospital Stay: Payer: Medicare Other | Admitting: Hematology and Oncology

## 2016-04-14 DIAGNOSIS — B9681 Helicobacter pylori [H. pylori] as the cause of diseases classified elsewhere: Secondary | ICD-10-CM | POA: Insufficient documentation

## 2016-04-14 DIAGNOSIS — K2970 Gastritis, unspecified, without bleeding: Secondary | ICD-10-CM

## 2016-04-14 NOTE — Progress Notes (Deleted)
[facility] day:  04/14/2016  Chief Complaint: ZOE GOONAN is a 66 y.o. female with a history of left breast cancer (2002), right sided colon cancer (2014), mild pancytopenia, and unexplained weight loss  who is seen for 4 week assessment.  HPI:  The patient was last seen in the medical oncology clinic on 03/17/2016.  At that time, she felt better.  Vertigo had resolved.  She was eating better.  Exam was unremarkable.  She was keeping a food diary and weighing weekly.  She was following up with nutrition.  She had an appointment with gastroenterology.  She underwent EGD and colonoscopy on 04/04/2016 by Dr. Loistine Simas.  EGD revealed a grade A erosive esophagitis, non-bleeding erosive gastropathy, gastritis and duodenitis. Recommendations were for Protonix 40 mg a day and repeat upper endoscopy in 2 months to assess healing.  Pathology revealed moderate chronic gastritis, Helicobacter pylori associated.  Pathology was negative for dysplasia and malignancy.   Colonoscopy revealed one 2 mm polyp in the distal sigmoid colon which was removed. There was a patent functional end-to-end ileocolonic anastomosis with healthy-appearing mucosa.  Pathology revealed a serrated polyp with features of early serrated adenoma. There was no evidence of dysplasia or malignancy.  During the interim,  Past Medical History:  Diagnosis Date  . Abdominal pain 2013  . Anemia   . Arthritis   . Bleeding nose   . Blood in urine   . Breast cancer, left breast ([place]) 2002   Left Mastectomy, chemo + Rad tx's.  . Breast cyst   . Bronchitis   . Bruises easily   . Colon cancer ([place]) 10/2012   Partial colon resection and chemo tx's.  . Colon cancer ([place])   . Difficulty urinating   . Diverticulitis   . Glaucoma   . Hearing loss   . Hyperlipidemia 20 years  . Kidney stone   . Neuromuscular disorder ([facility])    neuropathy  feet and hands from chemo tx  . Pneumonia   .  Shingles   . Swollen lymph nodes     Past Surgical History:  Procedure Laterality Date  . ABDOMINAL HYSTERECTOMY  1973  . APPENDECTOMY  1969  . CATARACT EXTRACTION W/PHACO Left 07/11/2015   Procedure: CATARACT EXTRACTION PHACO AND INTRAOCULAR LENS PLACEMENT ([facility]) left eye;  Surgeon: Leandrew Koyanagi, MD;  Location: [facility];  Service: Ophthalmology;  Laterality: Left;  . CESAREAN SECTION    . COLON RESECTION    . COLONOSCOPY  2004  . COLONOSCOPY N/A 04/04/2016   Procedure: COLONOSCOPY;  Surgeon: Lollie Sails, MD;  Location: [facility] ENDOSCOPY;  Service: Endoscopy;  Laterality: N/A;  . ESOPHAGOGASTRODUODENOSCOPY N/A 04/04/2016   Procedure: ESOPHAGOGASTRODUODENOSCOPY (EGD);  Surgeon: Lollie Sails, MD;  Location: [facility] ENDOSCOPY;  Service: Endoscopy;  Laterality: N/A;  . kidney stone removal     . MASTECTOMY Bilateral 2002,2010  . salpingo oophorectmy   1973    Family History  Problem Relation Age of Onset  . Hypertension Mother   . Alzheimer's disease Mother   . Cancer Father     prostate  . Cancer Sister     breast  . Hypertension Sister   . Cancer Brother     stomach  . Hypertension Brother   . Sarcoidosis Other     positive family history.  . Cancer Sister     breast  . Cancer Sister     breast    Social History:  reports that she has quit smoking. Her smoking use included Cigarettes. She has a 52.50 pack-year smoking history. She has never used smokeless tobacco. She reports that she does not drink alcohol or use drugs.  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  She quit smoking in 2003.  She lives in [place].  She belongs to the [employer].  The patient is alone today.  Allergies:  Allergies  Allergen Reactions  . Sulfa Antibiotics Swelling  . Hydrocodone-Ibuprofen Other (See Comments)    Patient feels like there is a heavy wight on her chest  . Statins Other (See Comments)    Leg cramps and groin pain  . Other Itching    Tree Nuts   . Tape Rash    Current Medications: Current Outpatient Prescriptions  Medication Sig Dispense Refill  . Calcium Carbonate-Vit D-Min (CALCIUM 1200 PO) Take 1 tablet by mouth daily. Reported on 07/09/2015    . gabapentin (NEURONTIN) 100 MG capsule Take 100 mg by mouth 3 (three) times daily.    . hydrochlorothiazide (MICROZIDE) 12.5 MG capsule Take 12.5 mg by mouth daily.    Marland Kitchen latanoprost (XALATAN) 0.005 % ophthalmic solution Place 1 drop into both eyes at bedtime.    . meloxicam (MOBIC) 15 MG tablet Take 15 mg by mouth daily.    . nepafenac (NEVANAC) 0.1 % ophthalmic suspension 1 drop 3 (three) times daily.    . vitamin C (ASCORBIC ACID) 500 MG tablet Take 500 mg by mouth daily.     No current facility-administered medications for this visit.     Review of Systems:  GENERAL:  Feels "better".  No fevers or sweats.  Weight loss of 1 pound since last visit (see HPI). PERFORMANCE STATUS (ECOG):  1 HEENT:  No visual changes, runny nose, sore throat, mouth sores or tenderness. Lungs: No shortness of breath or cough.  No hemoptysis. Cardiac:  No chest pain, palpitations, orthopnea, or PND. GI:  Eating better.  Drinking Ensure.  No nausea, vomiting, diarrhea, constipation, melena or hematochezia. GU:  No urgency, frequency, dysuria, or hematuria. Musculoskeletal:  No back pain.  No joint pain.  No muscle tenderness. Extremities:  Toes dark after wearing shoes. Skin:  No rashes or skin changes. Neuro:  Vertigo, resolved.  No headache, numbness or weakness, balance or coordination issues. Endocrine:  No diabetes, thyroid issues, hot flashes or night sweats. Psych:  No mood changes, depression or anxiety.  Poor sleep. Pain:  No focal pain. Review of systems:  All other systems reviewed and found to be negative.  Physical Exam:  There were no vitals taken for this visit. GENERAL:  Well developed, well nourished, woman sitting comfortably in the exam room in no acute distress. MENTAL STATUS:   Alert and oriented to person, place and time. HEAD:  Brown straight jaw length hair.  Normocephalic, atraumatic, face symmetric, no Cushingoid features. EYES:  Glasses.  Brown eyes.  Pupils equal round and reactive to light and accomodation.  No conjunctivitis or scleral icterus. ENT:  Oropharynx clear without lesion.  Tongue normal. Mucous membranes moist.  RESPIRATORY:  Clear to auscultation without rales, wheezes or rhonchi. CARDIOVASCULAR:  Regular rate and rhythm without murmur, rub or gallop. ABDOMEN:  Soft, non-tender, with active bowel sounds, and no hepatosplenomegaly.  No masses. SKIN:  No rashes, ulcers or lesions. EXTREMITIES: Wearing a left sided lymphedema sleeve.  No skin discoloration or tenderness.  No palpable cords. LYMPH NODES: No palpable cervical, supraclavicular, axillary or inguinal adenopathy  NEUROLOGICAL:  Unremarkable. Stumble a little bit. PSYCH:  Appropriate.   No visits with results within 3 Day(s) from this visit.  Latest known visit with results is:  Admission on 04/04/2016, Discharged on 04/04/2016  Component Date Value Ref Range Status  . SURGICAL PATHOLOGY 04/04/2016    Final                   Value:Surgical Pathology CASE: [id number] PATIENT: Johnica Jarosz Surgical Pathology Report     SPECIMEN SUBMITTED: A. Stomach, antrum and body; cbx B. Pre pyloric erosion; cbx C. GEJ; cbx D. Colon polyp, distal sigmoid; cbx  CLINICAL HISTORY: None provided  PRE-OPERATIVE DIAGNOSIS: WT loss, personal HX colon cancer  POST-OPERATIVE DIAGNOSIS: Pre pyloric erosions, duodenitis and esophagitis ([facility]), polyp     DIAGNOSIS: A. STOMACH, ANTRUM AND BODY; COLD BIOPSY: - ANTRAL MUCOSA SHOWING MODERATE CHRONIC GASTRITIS WITH MILD ACTIVITY, HELICOBACTER PYLORI ASSOCIATED. - OXYNTIC MUCOSA WITH MODERATE CHRONIC GASTRITIS, HELICOBACTER PYLORI ASSOCIATED. - NEGATIVE FOR DYSPLASIA AND MALIGNANCY.  B.  PREPYLORIC EROSION; COLD BIOPSY: - MODERATE CHRONIC  GASTRITIS, HELICOBACTER PYLORI ASSOCIATED. - NEGATIVE FOR DYSPLASIA AND MALIGNANCY.  C. GEJ; COLD BIOPSY: - MODERATE CHRONIC ACTIVE INFLAMMATION, HELICOBACTER PYLORI ASSOCIATED.  - NEGATIVE FOR GOBLET CELLS, DYSPLASI                         A AND MALIGNANCY.  D. COLON POLYP, DISTAL SIGMOID; COLD BIOPSY: - SERRATED POLYP WITH FEATURES OF EARLY TRADITIONAL SERRATE ADENOMA. - NEGATIVE FOR HIGH GRADE DYSPLASIA AND MALIGNANCY.   GROSS DESCRIPTION: A. Labeled: Cold biopsy stomach antrum and body  Tissue fragment(s): 2  Size: 0.2 and 0.4 cm  Description: Tan tissue fragments  Entirely submitted in one cassette(s).   B. Labeled: Cold biopsy prepyloric erosion  Tissue fragment(s): 1  Size: 0.6 cm  Description: Tan tissue fragment  Entirely submitted in one cassette(s).   C. Labeled: Cold biopsy GEJ  Tissue fragment(s): 2  Size: 0.2-0.4 cm  Description: Tan tissue fragments  Entirely submitted in one cassette(s).   D. Labeled: Cold biopsy distal sigmoid colon polyp  Tissue fragment(s): 2  Size: 0.2-0.5 cm  Description: Tan tissue fragments  Entirely submitted in one cassette(s).     Final Diagnosis performed by Delorse Lek, MD.  Electronically signed 04/07/2016 11:45:32AM                             The electronic signature indicates that the named Attending Pathologist has evaluated the specimen  Technical component performed at [facility], [address], [place], [place] [address] Lab: [phone] Dir: Darrick Penna. Evette Doffing, MD  Professional component performed at [facility], [facility], [place], [place], [place] [address] Lab: [phone] Dir: Dellia Nims. Rubinas, MD      Assessment:  FYNLEE ROWLANDS is a 66 y.o. female with a history of left breast cancer (2002), right sided stage III colon cancer (2014), and mild pancytopenia.  She was diagnosed with left breast cancer in 2002.  She underwent left mastectomy in 03/2000 (no  pathology available).  She received Adriamycin and Cytoxan [facility]) which completed in 07/2000 followed by Herceptin and Taxotere in 08/2000.  She did not receive hormonal therapy.  She underwent right sided mastectomy in 02/2008.  CA27.29 was 40.9 on 06/06/2010, 42.6 on 02/04/2011, 35.3 on 03/31/2012, 38.1 on 11/09/2012, 52.3 on 09/17/2015, 50.5 on 10/01/2015, and 47.8 on 02/11/2016.  She has intermittent anemia, neutropenia, and thrombocytopenia  since 10/2012.  Bone marrow aspiration and biopsy in 10/2012 was negative for any myelodysplastic syndrome.  Bone marrow revealed no iron storage.  CBC on 09/17/2015 revealed a hematocrit 39.6, hemoglobin 13.3, MCV 96, platelets 128,000, white count 3000 with ANC of 1700. B12 was 266 (low normal) with a normal MMA on 09/19/2015.  Normal studies included:  folate, TSH, free T4.  Ferritin was 20.    She was diagnosed with colon cancer in 12/2012.  She underwent right hemicolectomy on 12/24/2012.  Pathology revealed a 3.7 cm grade II adenocarcinoma which extended through the muscularis propria and into the adjacent adipose.  One of 27 lymph nodes were positive.  Pathologic stage III ([id number]).    She was treated on CALGB protocol with FOLFOX chemotherapy +/- celecoxib.  She withdrew from the study in 02/25/2013.  She described trouble with counts.  She received 6 cycles of FOLFOX (01/28/2013 - 04/29/2013).   Last colonoscopy was in 2015 (due 2018)  CEA was 3.8 on 12/09/2012, 6.3 on 01/27/2013, 10.6 on 05/26/2013, 5.9 on 09/22/2013, 7.1 on 05/10/2014, 7.0 on 11/08/2014, 5.8 on 05/08/2015, 7.2 on 09/17/2015, 5.9 on 10/01/2015, and 6.5 on 02/11/2016.  PET scan on 10/09/2015 revealed no evidence of recurrent disease.  Chest, abdomen, and pelvic CT scan on 02/15/2016 revealed no findings of recurrent malignancy.  There were radiation therapy related opacities in the left lung.  There was prominent stool throughout the colon.  There was lumbar degenerative disc  disease.  She notes gradual weight loss since discontinuation of an anti-depressant years ago.  She previously weighed 110 pounds. TSH and free T4 were normal on 02/11/2016.  Labs on 02/26/2016 revealed a normal cortisol, sed rate, and ANA.  HIV testing was negative.  Left lower extremity duplex on 09/19/2015 revealed no evidence of DVT.  She had a left popliteal fossa Baker's cyst.  Symptomatically, she feels better.  Vertigo has resolved.  She is eating better.  Exam is unremarkable.   Plan: 1.  Review upper and lower endoscopy.  labs and scans.  No evidence of disease. 2.  Discuss Megace.  Patient declines. 3.  Discuss keeping a food diary and weight checks weekly at home.  She is to call if weight drops. 4.  Follow-up with GI on 03/25/2016. 5.  RTC monthly for weight check. 6.  RTC in 3 months for MD assessment and labs (CBC with diff, CMP, CA27.29, CEA, TSH, free T4).   Lequita Asal, MD  04/14/2016, 2:30 AM

## 2016-04-14 NOTE — Telephone Encounter (Signed)
Patient came to clinic today just for a weight check.  Patient weighs 115.4 pounds.

## 2016-04-14 NOTE — Telephone Encounter (Signed)
Patient informed that the GI doctor will call her with results of her endo and medications for hpylori treatment.

## 2016-05-12 ENCOUNTER — Inpatient Hospital Stay: Payer: Medicare Other | Admitting: Hematology and Oncology

## 2016-05-12 ENCOUNTER — Inpatient Hospital Stay: Payer: Medicare Other | Attending: Hematology and Oncology

## 2016-05-12 NOTE — Progress Notes (Signed)
Nutrition follow-up:  Patient was a no show for nutrition follow-up appointment today. Will follow-up as able.  Sadie Pickar B. [name], [place], [place] Registered Dietitian [phone] (pager)

## 2016-05-13 ENCOUNTER — Other Ambulatory Visit: Payer: Self-pay | Admitting: *Deleted

## 2016-05-13 ENCOUNTER — Telehealth: Payer: Self-pay | Admitting: *Deleted

## 2016-05-13 DIAGNOSIS — C182 Malignant neoplasm of ascending colon: Secondary | ICD-10-CM

## 2016-05-13 NOTE — Telephone Encounter (Signed)
  OK to refer to [facility] gastroenterology.  Per patient, due for colonoscopy in 2018.  M

## 2016-05-13 NOTE — Telephone Encounter (Signed)
Asking to be referred to GI at [facility] for her continued weight loss

## 2016-05-17 ENCOUNTER — Other Ambulatory Visit
Admission: RE | Admit: 2016-05-17 | Discharge: 2016-05-17 | Disposition: A | Discharge status: Home / Self Care | Payer: Medicare Other | Source: Ambulatory Visit | Attending: Student | Admitting: Student

## 2016-05-17 DIAGNOSIS — A048 Other specified bacterial intestinal infections: Secondary | ICD-10-CM | POA: Insufficient documentation

## 2016-05-17 DIAGNOSIS — R1013 Epigastric pain: Secondary | ICD-10-CM | POA: Insufficient documentation

## 2016-05-21 LAB — H. PYLORI ANTIGEN, STOOL: H. Pylori Stool Ag, Eia: POSITIVE — AB

## 2016-06-17 ENCOUNTER — Inpatient Hospital Stay: Payer: Medicare Other

## 2016-06-17 ENCOUNTER — Ambulatory Visit: Payer: Medicare Other | Admitting: Hematology and Oncology

## 2016-06-17 ENCOUNTER — Other Ambulatory Visit: Payer: Medicare Other

## 2016-06-17 ENCOUNTER — Inpatient Hospital Stay: Payer: Medicare Other | Admitting: Hematology and Oncology

## 2016-06-20 ENCOUNTER — Inpatient Hospital Stay: Payer: Medicare Other | Attending: Hematology and Oncology

## 2016-06-20 ENCOUNTER — Inpatient Hospital Stay (HOSPITAL_BASED_OUTPATIENT_CLINIC_OR_DEPARTMENT_OTHER): Payer: Medicare Other | Admitting: Hematology and Oncology

## 2016-06-20 VITALS — BP 125/71 | HR 94 | Temp 97.8°F | Resp 18 | Wt 112.4 lb

## 2016-06-20 DIAGNOSIS — C182 Malignant neoplasm of ascending colon: Secondary | ICD-10-CM

## 2016-06-20 DIAGNOSIS — Z9011 Acquired absence of right breast and nipple: Secondary | ICD-10-CM | POA: Insufficient documentation

## 2016-06-20 DIAGNOSIS — E538 Deficiency of other specified B group vitamins: Secondary | ICD-10-CM

## 2016-06-20 DIAGNOSIS — B9681 Helicobacter pylori [H. pylori] as the cause of diseases classified elsewhere: Secondary | ICD-10-CM | POA: Insufficient documentation

## 2016-06-20 DIAGNOSIS — R7989 Other specified abnormal findings of blood chemistry: Secondary | ICD-10-CM

## 2016-06-20 DIAGNOSIS — Z85038 Personal history of other malignant neoplasm of large intestine: Secondary | ICD-10-CM | POA: Insufficient documentation

## 2016-06-20 DIAGNOSIS — Z853 Personal history of malignant neoplasm of breast: Secondary | ICD-10-CM | POA: Diagnosis not present

## 2016-06-20 DIAGNOSIS — Z9012 Acquired absence of left breast and nipple: Secondary | ICD-10-CM

## 2016-06-20 DIAGNOSIS — D61818 Other pancytopenia: Secondary | ICD-10-CM

## 2016-06-20 DIAGNOSIS — R634 Abnormal weight loss: Secondary | ICD-10-CM

## 2016-06-20 DIAGNOSIS — Z923 Personal history of irradiation: Secondary | ICD-10-CM | POA: Diagnosis not present

## 2016-06-20 DIAGNOSIS — Z9221 Personal history of antineoplastic chemotherapy: Secondary | ICD-10-CM

## 2016-06-20 LAB — COMPREHENSIVE METABOLIC PANEL
ALT: 12 U/L — ABNORMAL LOW (ref 14–54)
AST: 29 U/L (ref 15–41)
Albumin: 3.5 g/dL (ref 3.5–5.0)
Alkaline Phosphatase: 51 U/L (ref 38–126)
Anion gap: 3 — ABNORMAL LOW (ref 5–15)
BUN: 11 mg/dL (ref 6–20)
CO2: 30 mmol/L (ref 22–32)
Calcium: 8.5 mg/dL — ABNORMAL LOW (ref 8.9–10.3)
Chloride: 102 mmol/L (ref 101–111)
Creatinine, Ser: 0.88 mg/dL (ref 0.44–1.00)
GFR calc Af Amer: >60 mL/min (ref >60)
GFR calc non Af Amer: >60 mL/min (ref >60)
Glucose, Bld: 105 mg/dL — ABNORMAL HIGH (ref 65–99)
Potassium: 3.4 mmol/L — ABNORMAL LOW (ref 3.5–5.1)
Sodium: 135 mmol/L (ref 135–145)
Total Bilirubin: 0.6 mg/dL (ref 0.3–1.2)
Total Protein: 6.6 g/dL (ref 6.5–8.1)

## 2016-06-20 LAB — CBC WITH DIFFERENTIAL/PLATELET
Basophils Absolute: 0 10*3/uL (ref 0–0.1)
Basophils Relative: 1 %
Eosinophils Absolute: 0 10*3/uL (ref 0–0.7)
Eosinophils Relative: 0 %
HCT: 37.5 % (ref 35.0–47.0)
Hemoglobin: 12.7 g/dL (ref 12.0–16.0)
Lymphocytes Relative: 26 %
Lymphs Abs: 0.7 10*3/uL — ABNORMAL LOW (ref 1.0–3.6)
MCH: 32.5 pg (ref 26.0–34.0)
MCHC: 33.9 g/dL (ref 32.0–36.0)
MCV: 95.8 fL (ref 80.0–100.0)
Monocytes Absolute: 0.2 10*3/uL (ref 0.2–0.9)
Monocytes Relative: 7 %
Neutro Abs: 1.9 10*3/uL (ref 1.4–6.5)
Neutrophils Relative %: 66 %
Platelets: 158 10*3/uL (ref 150–440)
RBC: 3.91 MIL/uL (ref 3.80–5.20)
RDW: 13.7 % (ref 11.5–14.5)
WBC: 2.9 10*3/uL — ABNORMAL LOW (ref 3.6–11.0)

## 2016-06-20 LAB — T4, FREE: Free T4: 0.84 ng/dL (ref 0.61–1.12)

## 2016-06-20 LAB — TSH: TSH: 0.55 u[IU]/mL (ref 0.350–4.500)

## 2016-06-20 NOTE — Progress Notes (Signed)
Patient offers no concerns today. 

## 2016-06-20 NOTE — Progress Notes (Signed)
[facility]-  Cancer Center  Clinic day:  06/20/2016  Chief Complaint: Amy Bird is a 66 y.o. female with a history of left breast cancer (2002), right sided colon cancer (2014), mild pancytopenia, and unexplained weight loss  who is seen for 3 month assessment.  HPI:  The patient was last seen in the medical oncology clinic on 03/17/2016.  At that time, she was eating more.  Vertigo had resolved.  We discussed keeping a food diary and weight checks.  She was to follow-up with GI.  She was seen by Janice Woodard, PA at the [facility] clinic on 05/27/2016.  She was diagnosed with H pylori and was treated with triple therapy.  Symptoms returned and she was + for H pylori stool antigen.  She was placed on Flagyl and tetracycline.  She was to continue her PPI after completing H pylori treatment.    Symptomatically, she is eating well.  She notes that she does not "rest as good".  She feels achy.   Past Medical History:  Diagnosis Date  . Abdominal pain 2013  . Anemia   . Arthritis   . Bleeding nose   . Blood in urine   . Breast cancer, left breast (HCC) 2002   Left Mastectomy, chemo + Rad tx's.  . Breast cyst   . Bronchitis   . Bruises easily   . Colon cancer (HCC) 10/2012   Partial colon resection and chemo tx's.  . Colon cancer (HCC)   . Difficulty urinating   . Diverticulitis   . Glaucoma   . Hearing loss   . Hyperlipidemia 20 years  . Kidney stone   . Neuromuscular disorder (HCC)    neuropathy  feet and hands from chemo tx  . Pneumonia   . Shingles   . Swollen lymph nodes     Past Surgical History:  Procedure Laterality Date  . ABDOMINAL HYSTERECTOMY  1973  . APPENDECTOMY  1969  . CATARACT EXTRACTION W/PHACO Left 07/11/2015   Procedure: CATARACT EXTRACTION PHACO AND INTRAOCULAR LENS PLACEMENT (IOC) left eye;  Surgeon: Chadwick Brasington, MD;  Location: [facility];  Service: Ophthalmology;  Laterality: Left;  . CESAREAN SECTION    .  COLON RESECTION    . COLONOSCOPY  2004  . COLONOSCOPY N/A 04/04/2016   Procedure: COLONOSCOPY;  Surgeon: Martin U Skulskie, MD;  Location: [facility] ENDOSCOPY;  Service: Endoscopy;  Laterality: N/A;  . ESOPHAGOGASTRODUODENOSCOPY N/A 04/04/2016   Procedure: ESOPHAGOGASTRODUODENOSCOPY (EGD);  Surgeon: Martin U Skulskie, MD;  Location: [facility] ENDOSCOPY;  Service: Endoscopy;  Laterality: N/A;  . kidney stone removal     . MASTECTOMY Bilateral 2002,2010  . salpingo oophorectmy   1973    Family History  Problem Relation Age of Onset  . Hypertension Mother   . Alzheimer's disease Mother   . Cancer Father        prostate  . Cancer Sister        breast  . Hypertension Sister   . Cancer Brother        stomach  . Hypertension Brother   . Sarcoidosis Other        positive family history.  . Cancer Sister        breast  . Cancer Sister        breast    Social History:  reports that she has quit smoking. Her smoking use included Cigarettes. She has a 52.50 pack-year smoking history. She has never used smokeless tobacco. She reports   that she does not drink alcohol or use drugs.  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  She quit smoking in 2003.  She lives in [place].  She belongs to the [employer].  The patient is alone today.  Allergies:  Allergies  Allergen Reactions  . Sulfa Antibiotics Swelling  . Hydrocodone-Ibuprofen Other (See Comments)    Patient feels like there is a heavy wight on her chest  . Statins Other (See Comments)    Leg cramps and groin pain  . Other Itching    Tree Nuts  . Tape Rash    Current Medications: Current Outpatient Prescriptions  Medication Sig Dispense Refill  . Calcium Carbonate-Vit D-Min (CALCIUM 1200 PO) Take 1 tablet by mouth daily. Reported on 07/09/2015    . latanoprost (XALATAN) 0.005 % ophthalmic solution Place 1 drop into both eyes at bedtime.    . pantoprazole (PROTONIX) 40 MG tablet Take 40 mg by mouth daily.    . vitamin C (ASCORBIC  ACID) 500 MG tablet Take 500 mg by mouth daily.    Marland Kitchen gabapentin (NEURONTIN) 100 MG capsule Take 100 mg by mouth 3 (three) times daily.    . hydrochlorothiazide (MICROZIDE) 12.5 MG capsule Take 12.5 mg by mouth daily.    . meloxicam (MOBIC) 15 MG tablet Take 15 mg by mouth daily.    . nepafenac (NEVANAC) 0.1 % ophthalmic suspension 1 drop 3 (three) times daily.     No current facility-administered medications for this visit.     Review of Systems:  GENERAL:  Feels "ok".  No fevers or sweats.  Weight loss of 3 pound since last visit. PERFORMANCE STATUS (ECOG):  1 HEENT:  No visual changes, runny nose, sore throat, mouth sores or tenderness. Lungs: No shortness of breath or cough.  No hemoptysis. Cardiac:  No chest pain, palpitations, orthopnea, or PND. GI:  Eating better.  Drinking Ensure.  H pylori treatment x 2 (see HPI).  No nausea, vomiting, diarrhea, constipation, melena or hematochezia. GU:  No urgency, frequency, dysuria, or hematuria. Musculoskeletal:  Achy.  No back pain.  No joint pain.  No muscle tenderness. Extremities:  Toes dark after wearing shoes. Skin:  No rashes or skin changes. Neuro:  Vertigo, resolved.  No headache, numbness or weakness, balance or coordination issues. Endocrine:  No diabetes, thyroid issues, hot flashes or night sweats. Psych:  No mood changes, depression or anxiety.  Poor sleep. Pain:  No focal pain. Review of systems:  All other systems reviewed and found to be negative.  Physical Exam:  Blood pressure 125/71, pulse 94, temperature 97.8 F (36.6 C), temperature source Tympanic, resp. rate 18, weight 112 lb 7 oz (51 kg). GENERAL:  Thin woman sitting comfortably in the exam room in no acute distress. MENTAL STATUS:  Alert and oriented to person, place and time. HEAD:  Brown straight jaw length hair.  Normocephalic, atraumatic, face symmetric, no Cushingoid features. EYES:  Glasses.  Brown eyes.  Pupils equal round and reactive to light and  accomodation.  No conjunctivitis or scleral icterus. ENT:  Oropharynx clear without lesion.  Tongue normal. Mucous membranes moist.  RESPIRATORY:  Clear to auscultation without rales, wheezes or rhonchi. CARDIOVASCULAR:  Regular rate and rhythm without murmur, rub or gallop. ABDOMEN:  Soft, non-tender, with active bowel sounds, and no hepatosplenomegaly.  No masses. SKIN:  No rashes, ulcers or lesions. EXTREMITIES: Wearing a left sided lymphedema sleeve.  No skin discoloration or tenderness.  No palpable cords. LYMPH NODES: No  palpable cervical, supraclavicular, axillary or inguinal adenopathy  NEUROLOGICAL: Unremarkable. PSYCH:  Appropriate.   Appointment on 06/20/2016  Component Date Value Ref Range Status  . Sodium 06/20/2016 135  135 - 145 mmol/L Final  . Potassium 06/20/2016 3.4* 3.5 - 5.1 mmol/L Final  . Chloride 06/20/2016 102  101 - 111 mmol/L Final  . CO2 06/20/2016 30  22 - 32 mmol/L Final  . Glucose, Bld 06/20/2016 105* 65 - 99 mg/dL Final  . BUN 06/20/2016 11  6 - 20 mg/dL Final  . Creatinine, Ser 06/20/2016 0.88  0.44 - 1.00 mg/dL Final  . Calcium 06/20/2016 8.5* 8.9 - 10.3 mg/dL Final  . Total Protein 06/20/2016 6.6  6.5 - 8.1 g/dL Final  . Albumin 06/20/2016 3.5  3.5 - 5.0 g/dL Final  . AST 06/20/2016 29  15 - 41 U/L Final  . ALT 06/20/2016 12* 14 - 54 U/L Final  . Alkaline Phosphatase 06/20/2016 51  38 - 126 U/L Final  . Total Bilirubin 06/20/2016 0.6  0.3 - 1.2 mg/dL Final  . GFR calc non Af Amer 06/20/2016 >60  >60 mL/min Final  . GFR calc Af Amer 06/20/2016 >60  >60 mL/min Final   Comment: (NOTE) The eGFR has been calculated using the CKD EPI equation. This calculation has not been validated in all clinical situations. eGFR's persistently <60 mL/min signify possible Chronic Kidney Disease.   . Anion gap 06/20/2016 3* 5 - 15 Final  . WBC 06/20/2016 2.9* 3.6 - 11.0 K/uL Final  . RBC 06/20/2016 3.91  3.80 - 5.20 MIL/uL Final  . Hemoglobin 06/20/2016 12.7  12.0 -  16.0 g/dL Final  . HCT 06/20/2016 37.5  35.0 - 47.0 % Final  . MCV 06/20/2016 95.8  80.0 - 100.0 fL Final  . MCH 06/20/2016 32.5  26.0 - 34.0 pg Final  . MCHC 06/20/2016 33.9  32.0 - 36.0 g/dL Final  . RDW 06/20/2016 13.7  11.5 - 14.5 % Final  . Platelets 06/20/2016 158  150 - 440 K/uL Final  . Neutrophils Relative % 06/20/2016 66  % Final  . Neutro Abs 06/20/2016 1.9  1.4 - 6.5 K/uL Final  . Lymphocytes Relative 06/20/2016 26  % Final  . Lymphs Abs 06/20/2016 0.7* 1.0 - 3.6 K/uL Final  . Monocytes Relative 06/20/2016 7  % Final  . Monocytes Absolute 06/20/2016 0.2  0.2 - 0.9 K/uL Final  . Eosinophils Relative 06/20/2016 0  % Final  . Eosinophils Absolute 06/20/2016 0.0  0 - 0.7 K/uL Final  . Basophils Relative 06/20/2016 1  % Final  . Basophils Absolute 06/20/2016 0.0  0 - 0.1 K/uL Final  . Free T4 06/20/2016 0.84  0.61 - 1.12 ng/dL Final   Comment: (NOTE) Biotin ingestion may interfere with free T4 tests. If the results are inconsistent with the TSH level, previous test results, or the clinical presentation, then consider biotin interference. If needed, order repeat testing after stopping biotin.   . TSH 06/20/2016 0.550  0.350 - 4.500 uIU/mL Final   Performed by a 3rd Generation assay with a functional sensitivity of <=0.01 uIU/mL.    Assessment:  Amy Bird is a 66 y.o. female with a history of left breast cancer (2002), right sided stage III colon cancer (2014), and mild pancytopenia.  She was diagnosed with left breast cancer in 2002.  She underwent left mastectomy in 03/2000 (no pathology available).  She received Adriamycin and Cytoxan (AC) which completed in 07/2000 followed by Herceptin and Taxotere in   08/2000.  She did not receive hormonal therapy.  She underwent right sided mastectomy in 02/2008.  CA27.29 was 40.9 on 06/06/2010, 42.6 on 02/04/2011, 35.3 on 03/31/2012, 38.1 on 11/09/2012, 52.3 on 09/17/2015, 50.5 on 10/01/2015, 47.8 on 02/11/2016, and 36.1 on  06/20/2016.  She has intermittent anemia, neutropenia, and thrombocytopenia since 10/2012.  Bone marrow aspiration and biopsy in 10/2012 was negative for any myelodysplastic syndrome.  Bone marrow revealed no iron storage.  CBC on 09/17/2015 revealed a hematocrit 39.6, hemoglobin 13.3, MCV 96, platelets 128,000, white count 3000 with ANC of 1700. B12 was 266 (low normal) with a normal MMA on 09/19/2015.  Normal studies included:  folate, TSH, free T4.  Ferritin was 20.    She was diagnosed with colon cancer in 12/2012.  She underwent right hemicolectomy on 12/24/2012.  Pathology revealed a 3.7 cm grade II adenocarcinoma which extended through the muscularis propria and into the adjacent adipose.  One of 27 lymph nodes were positive.  Pathologic stage III (T3N1aM0).    She was treated on CALGB protocol with FOLFOX chemotherapy +/- celecoxib.  She withdrew from the study in 02/25/2013.  She described trouble with counts.  She received 6 cycles of FOLFOX (01/28/2013 - 04/29/2013).   Last colonoscopy was in 2015 (due 2018)  CEA was 3.8 on 12/09/2012, 6.3 on 01/27/2013, 10.6 on 05/26/2013, 5.9 on 09/22/2013, 7.1 on 05/10/2014, 7.0 on 11/08/2014, 5.8 on 05/08/2015, 7.2 on 09/17/2015, 5.9 on 10/01/2015, and 6.5 on 02/11/2016.  PET scan on 10/09/2015 revealed no evidence of recurrent disease.  Chest, abdomen, and pelvic CT scan on 02/15/2016 revealed no findings of recurrent malignancy.  There were radiation therapy related opacities in the left lung.  There was prominent stool throughout the colon.  There was lumbar degenerative disc disease.  She notes gradual weight loss since discontinuation of an anti-depressant years ago.  She previously weighed 110 pounds. TSH and free T4 were normal on 02/11/2016.  Labs on 02/26/2016 revealed a normal cortisol, sed rate, and ANA.  HIV testing was negative.  Left lower extremity duplex on 09/19/2015 revealed no evidence of DVT.  She had a left popliteal fossa Baker's  cyst.  She has been diagnosed with H pylori (stool antigen + 05/17/2016) and treated x 2.  Symptomatically, she feels sleepy as she is not resting well.  She is well.  She has lost weight.  Exam is unremarkable.   Plan: 1.  Labs today:  CBC with diff, CMP, CA27.29, MMA, TSH, free T4. 2.  Discuss H pylori and treatment. 3.  Follow-up with GI.   4.  Discuss caloric intake. 5.  RTC in 4 months for MD assessment and labs (CBC with diff, CMP, CA27.29, CEA).   Melissa C Corcoran, MD  06/20/2016, 3:27 PM 

## 2016-06-21 LAB — CANCER ANTIGEN 27.29: CA 27.29: 36.1 U/mL (ref 0.0–38.6)

## 2016-06-23 ENCOUNTER — Telehealth: Payer: Self-pay | Admitting: *Deleted

## 2016-06-23 NOTE — Telephone Encounter (Signed)
Called patient to inform her that her tumor marker was wnl, patient was appreciative and excited, voiced understanding.

## 2016-06-24 LAB — METHYLMALONIC ACID, SERUM: Methylmalonic Acid, Quantitative: 135 nmol/L (ref 0–378)

## 2016-08-01 ENCOUNTER — Ambulatory Visit: Payer: Medicare Other | Admitting: Hematology and Oncology

## 2016-08-01 ENCOUNTER — Other Ambulatory Visit: Payer: Medicare Other

## 2016-08-19 ENCOUNTER — Encounter: Payer: Self-pay | Admitting: *Deleted

## 2016-08-25 NOTE — Discharge Instructions (Signed)

## 2016-08-27 ENCOUNTER — Ambulatory Visit: Payer: Medicare Other | Admitting: Anesthesiology

## 2016-08-27 ENCOUNTER — Encounter
Admission: RE | Disposition: A | Discharge status: Home / Self Care | Payer: Self-pay | Source: Ambulatory Visit | Attending: Ophthalmology

## 2016-08-27 ENCOUNTER — Ambulatory Visit
Admission: RE | Admit: 2016-08-27 | Discharge: 2016-08-27 | Disposition: A | Discharge status: Home / Self Care | Payer: Medicare Other | Source: Ambulatory Visit | Attending: Ophthalmology | Admitting: Ophthalmology

## 2016-08-27 DIAGNOSIS — E7800 Pure hypercholesterolemia, unspecified: Secondary | ICD-10-CM | POA: Diagnosis not present

## 2016-08-27 DIAGNOSIS — Z85038 Personal history of other malignant neoplasm of large intestine: Secondary | ICD-10-CM | POA: Diagnosis not present

## 2016-08-27 DIAGNOSIS — Z79899 Other long term (current) drug therapy: Secondary | ICD-10-CM | POA: Diagnosis not present

## 2016-08-27 DIAGNOSIS — Z91040 Latex allergy status: Secondary | ICD-10-CM | POA: Diagnosis not present

## 2016-08-27 DIAGNOSIS — Z882 Allergy status to sulfonamides status: Secondary | ICD-10-CM | POA: Insufficient documentation

## 2016-08-27 DIAGNOSIS — Z9221 Personal history of antineoplastic chemotherapy: Secondary | ICD-10-CM | POA: Insufficient documentation

## 2016-08-27 DIAGNOSIS — Z888 Allergy status to other drugs, medicaments and biological substances status: Secondary | ICD-10-CM | POA: Diagnosis not present

## 2016-08-27 DIAGNOSIS — Z87891 Personal history of nicotine dependence: Secondary | ICD-10-CM | POA: Insufficient documentation

## 2016-08-27 DIAGNOSIS — H2511 Age-related nuclear cataract, right eye: Secondary | ICD-10-CM | POA: Insufficient documentation

## 2016-08-27 DIAGNOSIS — Z853 Personal history of malignant neoplasm of breast: Secondary | ICD-10-CM | POA: Diagnosis not present

## 2016-08-27 HISTORY — DX: Lymphedema, not elsewhere classified: I89.0

## 2016-08-27 HISTORY — PX: CATARACT EXTRACTION W/PHACO: SHX586

## 2016-08-27 HISTORY — DX: Dizziness and giddiness: R42

## 2016-08-27 SURGERY — PHACOEMULSIFICATION, CATARACT, WITH IOL INSERTION
Anesthesia: Monitor Anesthesia Care | Laterality: Right | Wound class: Clean

## 2016-08-27 MED ORDER — ARMC OPHTHALMIC DILATING DROPS
1.0000 "application " | OPHTHALMIC | Status: DC | PRN
  Administered 2016-08-27 (×3): 1 via OPHTHALMIC

## 2016-08-27 MED ORDER — LACTATED RINGERS IV SOLN: INTRAVENOUS | Status: DC

## 2016-08-27 MED ORDER — ACETAMINOPHEN 160 MG/5ML PO SOLN: 325.0000 mg | ORAL | Status: DC | PRN

## 2016-08-27 MED ORDER — CEFUROXIME OPHTHALMIC INJECTION 1 MG/0.1 ML
INJECTION | OPHTHALMIC | Status: DC | PRN
  Administered 2016-08-27: 0.1 mL via OPHTHALMIC

## 2016-08-27 MED ORDER — EPINEPHRINE PF 1 MG/ML IJ SOLN
INTRAOCULAR | Status: DC | PRN
  Administered 2016-08-27: 53 mL via OPHTHALMIC

## 2016-08-27 MED ORDER — MOXIFLOXACIN HCL 0.5 % OP SOLN
1.0000 [drp] | OPHTHALMIC | Status: DC | PRN
  Administered 2016-08-27 (×3): 1 [drp] via OPHTHALMIC

## 2016-08-27 MED ORDER — LIDOCAINE HCL (PF) 2 % IJ SOLN
INTRAOCULAR | Status: DC | PRN
  Administered 2016-08-27: 1 mL via INTRAOCULAR

## 2016-08-27 MED ORDER — NA HYALUR & NA CHOND-NA HYALUR 0.4-0.35 ML IO KIT
PACK | INTRAOCULAR | Status: DC | PRN
  Administered 2016-08-27: 1 mL via INTRAOCULAR

## 2016-08-27 MED ORDER — FENTANYL CITRATE (PF) 100 MCG/2ML IJ SOLN
INTRAMUSCULAR | Status: DC | PRN
  Administered 2016-08-27: 50 ug via INTRAVENOUS

## 2016-08-27 MED ORDER — ACETAMINOPHEN 325 MG PO TABS: 325.0000 mg | ORAL_TABLET | ORAL | Status: DC | PRN

## 2016-08-27 SURGICAL SUPPLY — 27 items
CANNULA ANT/CHMB 27G (MISCELLANEOUS) ×1 IMPLANT
CANNULA ANT/CHMB 27GA (MISCELLANEOUS) ×2 IMPLANT
CARTRIDGE ABBOTT (MISCELLANEOUS) IMPLANT
GLOVE SURG LX 7.5 STRW (GLOVE) ×1
GLOVE SURG LX STRL 7.5 STRW (GLOVE) ×1 IMPLANT
GLOVE SURG TRIUMPH 8.0 PF LTX (GLOVE) ×2 IMPLANT
GOWN STRL REUS W/ TWL LRG LVL3 (GOWN DISPOSABLE) ×2 IMPLANT
GOWN STRL REUS W/TWL LRG LVL3 (GOWN DISPOSABLE) ×4
LENS IOL TECNIS ITEC 21.5 (Intraocular Lens) ×1 IMPLANT
MARKER SKIN DUAL TIP RULER LAB (MISCELLANEOUS) ×2 IMPLANT
NDL FILTER BLUNT 18X1 1/2 (NEEDLE) ×1 IMPLANT
NDL RETROBULBAR .5 NSTRL (NEEDLE) IMPLANT
NEEDLE FILTER BLUNT 18X 1/2SAF (NEEDLE) ×1
NEEDLE FILTER BLUNT 18X1 1/2 (NEEDLE) ×1 IMPLANT
PACK CATARACT BRASINGTON (MISCELLANEOUS) ×2 IMPLANT
PACK EYE AFTER SURG (MISCELLANEOUS) ×2 IMPLANT
PACK OPTHALMIC (MISCELLANEOUS) ×2 IMPLANT
RING MALYGIN 7.0 (MISCELLANEOUS) IMPLANT
SUT ETHILON 10-0 CS-B-6CS-B-6 (SUTURE)
SUT VICRYL  9 0 (SUTURE)
SUT VICRYL 9 0 (SUTURE) IMPLANT
SUTURE EHLN 10-0 CS-B-6CS-B-6 (SUTURE) IMPLANT
SYR 3ML LL SCALE MARK (SYRINGE) ×2 IMPLANT
SYR 5ML LL (SYRINGE) ×2 IMPLANT
SYR TB 1ML LUER SLIP (SYRINGE) ×2 IMPLANT
WATER STERILE IRR 250ML POUR (IV SOLUTION) ×2 IMPLANT
WIPE NON LINTING 3.25X3.25 (MISCELLANEOUS) ×2 IMPLANT

## 2016-08-27 NOTE — Anesthesia Preprocedure Evaluation (Signed)
Anesthesia Evaluation  Patient identified by MRN, date of birth, ID band Patient awake    Reviewed: Allergy & Precautions, H&P , NPO status , Patient's Chart, lab work & pertinent test results, reviewed documented beta blocker date and time   Airway Mallampati: III  TM Distance: >3 FB Neck ROM: full    Dental no notable dental hx.    Pulmonary former smoker,    Pulmonary exam normal breath sounds clear to auscultation       Cardiovascular Exercise Tolerance: Good negative cardio ROS Normal cardiovascular exam Rhythm:regular Rate:Normal     Neuro/Psych  Neuromuscular disease negative psych ROS   GI/Hepatic negative GI ROS, Neg liver ROS,   Endo/Other  negative endocrine ROS  Renal/GU negative Renal ROS  negative genitourinary   Musculoskeletal   Abdominal   Peds  Hematology  (+) anemia , Hx of breast CA and Colon CA   Anesthesia Other Findings   Reproductive/Obstetrics negative OB ROS                             Anesthesia Physical Anesthesia Plan  ASA: II  Anesthesia Plan: MAC   Post-op Pain Management:    Induction:   PONV Risk Score and Plan:   Airway Management Planned:   Additional Equipment:   Intra-op Plan:   Post-operative Plan:   Informed Consent: I have reviewed the patients History and Physical, chart, labs and discussed the procedure including the risks, benefits and alternatives for the proposed anesthesia with the patient or authorized representative who has indicated his/her understanding and acceptance.   Dental Advisory Given  Plan Discussed with: CRNA and Anesthesiologist  Anesthesia Plan Comments:         Anesthesia Quick Evaluation

## 2016-08-27 NOTE — Op Note (Signed)
LOCATION:  [facility]   PREOPERATIVE DIAGNOSIS:    Nuclear sclerotic cataract right eye. H25.11   POSTOPERATIVE DIAGNOSIS:  Nuclear sclerotic cataract right eye.     PROCEDURE:  Phacoemusification with posterior chamber intraocular lens placement of the right eye   LENS:   Implant Name Type Inv. Item Serial No. Manufacturer Lot No. LRB No. Used  LENS IOL DIOP 21.5 - [id number] Intraocular Lens LENS IOL DIOP 21.5 [id number]   Right 1        ULTRASOUND TIME: 14 % of 1 minutes, 5 seconds.  CDE 8.9   SURGEON:  Wyonia Hough, MD   ANESTHESIA:  Topical with tetracaine drops and 2% Xylocaine jelly, augmented with 1% preservative-free intracameral lidocaine.    COMPLICATIONS:  None.   DESCRIPTION OF PROCEDURE:  The patient was identified in the holding room and transported to the operating room and placed in the supine position under the operating microscope.  The right eye was identified as the operative eye and it was prepped and draped in the usual sterile ophthalmic fashion.   A 1 millimeter clear-corneal paracentesis was made at the 12:00 position.  0.5 ml of preservative-free 1% lidocaine was injected into the anterior chamber. The anterior chamber was filled with Viscoat viscoelastic.  A 2.4 millimeter keratome was used to make a near-clear corneal incision at the 9:00 position.  A curvilinear capsulorrhexis was made with a cystotome and capsulorrhexis forceps.  Balanced salt solution was used to hydrodissect and hydrodelineate the nucleus.   Phacoemulsification was then used in stop and chop fashion to remove the lens nucleus and epinucleus.  The remaining cortex was then removed using the irrigation and aspiration handpiece. Provisc was then placed into the capsular bag to distend it for lens placement.  A lens was then injected into the capsular bag.  The remaining viscoelastic was aspirated.   Wounds were hydrated with balanced salt solution.  The anterior  chamber was inflated to a physiologic pressure with balanced salt solution.  No wound leaks were noted. Cefuroxime 0.1 ml of a 10mg /ml solution was injected into the anterior chamber for a dose of 1 mg of intracameral antibiotic at the completion of the case. The patient was taken to the recovery room in stable condition without complications of anesthesia or surgery.   Marshal Schrecengost 08/27/2016, 10:16 AM

## 2016-08-27 NOTE — Transfer of Care (Signed)
Immediate Anesthesia Transfer of Care Note  Patient: Amy Bird  Procedure(s) Performed: Procedure(s) with comments: CATARACT EXTRACTION PHACO AND INTRAOCULAR LENS PLACEMENT (IOC) (Right) - IVA TOPICAL RIGHT  Patient Location: PACU  Anesthesia Type: MAC  Level of Consciousness: awake, alert  and patient cooperative  Airway and Oxygen Therapy: Patient Spontanous Breathing and Patient connected to supplemental oxygen  Post-op Assessment: Post-op Vital signs reviewed, Patient's Cardiovascular Status Stable, Respiratory Function Stable, Patent Airway and No signs of Nausea or vomiting  Post-op Vital Signs: Reviewed and stable  Complications: No apparent anesthesia complications

## 2016-08-27 NOTE — Anesthesia Postprocedure Evaluation (Signed)
Anesthesia Post Note  Patient: Amy Bird  Procedure(s) Performed: Procedure(s) (LRB): CATARACT EXTRACTION PHACO AND INTRAOCULAR LENS PLACEMENT (IOC) (Right)  Patient location during evaluation: PACU Anesthesia Type: MAC Level of consciousness: awake and alert Pain management: pain level controlled Vital Signs Assessment: post-procedure vital signs reviewed and stable Respiratory status: spontaneous breathing, nonlabored ventilation and respiratory function stable Cardiovascular status: stable and blood pressure returned to baseline Anesthetic complications: no    Trecia Rogers

## 2016-08-27 NOTE — Anesthesia Procedure Notes (Signed)
Procedure Name: MAC Performed by: Tiron Suski Pre-anesthesia Checklist: Patient identified, Emergency Drugs available, Suction available, Timeout performed and Patient being monitored Patient Re-evaluated:Patient Re-evaluated prior to inductionOxygen Delivery Method: Nasal cannula Placement Confirmation: positive ETCO2     

## 2016-08-27 NOTE — H&P (Signed)
The History and Physical notes are on paper, have been signed, and are to be scanned. The patient remains stable and unchanged from the H&P.   Previous H&P reviewed, patient examined, and there are no changes.  Amy Bird 08/27/2016 9:03 AM

## 2016-08-28 ENCOUNTER — Encounter: Payer: Self-pay | Admitting: Ophthalmology

## 2016-09-17 ENCOUNTER — Encounter: Payer: Self-pay | Admitting: Hematology and Oncology

## 2016-10-20 ENCOUNTER — Inpatient Hospital Stay: Payer: Medicare Other | Attending: Hematology and Oncology

## 2016-10-20 ENCOUNTER — Encounter: Payer: Self-pay | Admitting: Hematology and Oncology

## 2016-10-20 ENCOUNTER — Other Ambulatory Visit: Payer: Self-pay | Admitting: *Deleted

## 2016-10-20 ENCOUNTER — Inpatient Hospital Stay (HOSPITAL_BASED_OUTPATIENT_CLINIC_OR_DEPARTMENT_OTHER): Payer: Medicare Other | Admitting: Hematology and Oncology

## 2016-10-20 VITALS — BP 105/67 | HR 80 | Temp 96.5°F | Resp 18 | Wt 110.6 lb

## 2016-10-20 DIAGNOSIS — Z853 Personal history of malignant neoplasm of breast: Secondary | ICD-10-CM

## 2016-10-20 DIAGNOSIS — Z87891 Personal history of nicotine dependence: Secondary | ICD-10-CM | POA: Insufficient documentation

## 2016-10-20 DIAGNOSIS — K2980 Duodenitis without bleeding: Secondary | ICD-10-CM | POA: Diagnosis not present

## 2016-10-20 DIAGNOSIS — D215 Benign neoplasm of connective and other soft tissue of pelvis: Secondary | ICD-10-CM

## 2016-10-20 DIAGNOSIS — H409 Unspecified glaucoma: Secondary | ICD-10-CM | POA: Insufficient documentation

## 2016-10-20 DIAGNOSIS — B9681 Helicobacter pylori [H. pylori] as the cause of diseases classified elsewhere: Secondary | ICD-10-CM

## 2016-10-20 DIAGNOSIS — Z9011 Acquired absence of right breast and nipple: Secondary | ICD-10-CM | POA: Diagnosis not present

## 2016-10-20 DIAGNOSIS — R634 Abnormal weight loss: Secondary | ICD-10-CM | POA: Diagnosis not present

## 2016-10-20 DIAGNOSIS — R197 Diarrhea, unspecified: Secondary | ICD-10-CM

## 2016-10-20 DIAGNOSIS — K319 Disease of stomach and duodenum, unspecified: Secondary | ICD-10-CM | POA: Insufficient documentation

## 2016-10-20 DIAGNOSIS — Z79899 Other long term (current) drug therapy: Secondary | ICD-10-CM

## 2016-10-20 DIAGNOSIS — E538 Deficiency of other specified B group vitamins: Secondary | ICD-10-CM

## 2016-10-20 DIAGNOSIS — Z923 Personal history of irradiation: Secondary | ICD-10-CM | POA: Insufficient documentation

## 2016-10-20 DIAGNOSIS — D61818 Other pancytopenia: Secondary | ICD-10-CM | POA: Insufficient documentation

## 2016-10-20 DIAGNOSIS — E785 Hyperlipidemia, unspecified: Secondary | ICD-10-CM | POA: Diagnosis not present

## 2016-10-20 DIAGNOSIS — Z9012 Acquired absence of left breast and nipple: Secondary | ICD-10-CM | POA: Diagnosis not present

## 2016-10-20 DIAGNOSIS — Z9221 Personal history of antineoplastic chemotherapy: Secondary | ICD-10-CM | POA: Diagnosis not present

## 2016-10-20 DIAGNOSIS — Z85038 Personal history of other malignant neoplasm of large intestine: Secondary | ICD-10-CM | POA: Diagnosis not present

## 2016-10-20 DIAGNOSIS — C182 Malignant neoplasm of ascending colon: Secondary | ICD-10-CM

## 2016-10-20 DIAGNOSIS — D125 Benign neoplasm of sigmoid colon: Secondary | ICD-10-CM | POA: Diagnosis not present

## 2016-10-20 DIAGNOSIS — K209 Esophagitis, unspecified: Secondary | ICD-10-CM

## 2016-10-20 DIAGNOSIS — Z9842 Cataract extraction status, left eye: Secondary | ICD-10-CM | POA: Insufficient documentation

## 2016-10-20 LAB — CBC WITH DIFFERENTIAL/PLATELET
Basophils Absolute: 0 10*3/uL (ref 0–0.1)
Basophils Relative: 1 %
Eosinophils Absolute: 0 10*3/uL (ref 0–0.7)
Eosinophils Relative: 1 %
HCT: 37.7 % (ref 35.0–47.0)
Hemoglobin: 12.7 g/dL (ref 12.0–16.0)
Lymphocytes Relative: 36 %
Lymphs Abs: 1.1 10*3/uL (ref 1.0–3.6)
MCH: 32.5 pg (ref 26.0–34.0)
MCHC: 33.7 g/dL (ref 32.0–36.0)
MCV: 96.4 fL (ref 80.0–100.0)
Monocytes Absolute: 0.1 10*3/uL — ABNORMAL LOW (ref 0.2–0.9)
Monocytes Relative: 5 %
Neutro Abs: 1.7 10*3/uL (ref 1.4–6.5)
Neutrophils Relative %: 57 %
Platelets: 131 10*3/uL — ABNORMAL LOW (ref 150–440)
RBC: 3.91 MIL/uL (ref 3.80–5.20)
RDW: 13 % (ref 11.5–14.5)
WBC: 2.9 10*3/uL — ABNORMAL LOW (ref 3.6–11.0)

## 2016-10-20 LAB — COMPREHENSIVE METABOLIC PANEL
ALT: 13 U/L — ABNORMAL LOW (ref 14–54)
AST: 23 U/L (ref 15–41)
Albumin: 3.5 g/dL (ref 3.5–5.0)
Alkaline Phosphatase: 68 U/L (ref 38–126)
Anion gap: 5 (ref 5–15)
BUN: 10 mg/dL (ref 6–20)
CO2: 29 mmol/L (ref 22–32)
Calcium: 8.9 mg/dL (ref 8.9–10.3)
Chloride: 102 mmol/L (ref 101–111)
Creatinine, Ser: 0.8 mg/dL (ref 0.44–1.00)
GFR calc Af Amer: >60 mL/min (ref >60)
GFR calc non Af Amer: >60 mL/min (ref >60)
Glucose, Bld: 137 mg/dL — ABNORMAL HIGH (ref 65–99)
Potassium: 3.6 mmol/L (ref 3.5–5.1)
Sodium: 136 mmol/L (ref 135–145)
Total Bilirubin: 0.7 mg/dL (ref 0.3–1.2)
Total Protein: 6.9 g/dL (ref 6.5–8.1)

## 2016-10-20 NOTE — Progress Notes (Signed)
Patient states she has had two tests for H. Pylori and is scheduled for another one in November. She has been on several medications for H.Pylori but is finished with all of them at this time.    States her stomach continues to hurt and she continues to have diarrhea.  Also continues to lose weight.  Patient also here today for breast cancer follow up.

## 2016-10-20 NOTE — Progress Notes (Signed)
[facility] [facility] day:  10/20/2016  Chief Complaint: Amy Bird is a 66 y.o. female with a history of left breast cancer (2002), right sided colon cancer (2014), mild pancytopenia, and unexplained weight loss  who is seen for 4 month assessment.  HPI:  The patient was last seen in the medical oncology clinic on 06/20/2016.  At that time, she felt sleepy as she was not resting well.  She had lost weight.  Exam was unremarkable.  Hematocrit was 37.5 with a hemoglobin of 12.7 and MCV 95.8.  TSH and free T4 were normal.  MMA was normal.  CA27.29 was normal.  She was scheduled to follow-up with GI for H pylori treatment (Dr.  Gustavo Lah).    Colonoscopy on 04/04/2016 revealed a 2 mm polyp in the distal sigmoid colon.  Pathology revealed a serrated polyp.  EGD revealed LA grade A esophagitis, non-bleeding erosive gastropathy, gastritis, and duodenitis.  Gastric and GEJ pathology revealed moderate chronic active inflammation, H pylori associated.  During the interim, patient doing "ok". She continues to have abdominal pain and diarrhea that is exacerbated by eating. She had a previously H.pylori infection that has been treated with 2 rounds of antibiotics since she was last here. This will make a total of 3 rounds of unsuccessful treatment. She continues to lose weight. She is down another 2 pounds since her last visit. Patient is scheduled to see a gastroenterologist at [facility] this week.   Patient is doing well otherwise. She has no B symptoms. She has intermittent issues with lymphedema in her LEFT upper extremity. She presents to the clinic with a sleeve in place.    Past Medical History:  Diagnosis Date  . Abdominal pain 2013  . Anemia   . Arthritis   . Bleeding nose   . Blood in urine   . Breast cancer, left breast ([place]) 2002   Left Mastectomy, chemo + Rad tx's.  . Breast cyst   . Bronchitis   . Bruises easily   . Colon cancer ([place]) 10/2012   Partial  colon resection and chemo tx's.  . Colon cancer ([place])   . Difficulty urinating   . Diverticulitis   . Glaucoma   . Hearing loss   . Hyperlipidemia 20 years  . Kidney stone   . Lymphedema of left arm   . Neuromuscular disorder ([place])    neuropathy  feet and hands from chemo tx  . Pneumonia   . Shingles   . Swollen lymph nodes   . Vertigo    last episode around Jan 2018    Past Surgical History:  Procedure Laterality Date  . ABDOMINAL HYSTERECTOMY  1973  . APPENDECTOMY  1969  . CATARACT EXTRACTION W/PHACO Left 07/11/2015   Procedure: CATARACT EXTRACTION PHACO AND INTRAOCULAR LENS PLACEMENT ([place]) left eye;  Surgeon: Leandrew Koyanagi, MD;  Location: [place];  Service: Ophthalmology;  Laterality: Left;  . CATARACT EXTRACTION W/PHACO Right 08/27/2016   Procedure: CATARACT EXTRACTION PHACO AND INTRAOCULAR LENS PLACEMENT ([facility]);  Surgeon: Leandrew Koyanagi, MD;  Location: [place];  Service: Ophthalmology;  Laterality: Right;  IVA TOPICAL RIGHT  . CESAREAN SECTION    . COLON RESECTION    . COLONOSCOPY  2004  . COLONOSCOPY N/A 04/04/2016   Procedure: COLONOSCOPY;  Surgeon: Lollie Sails, MD;  Location: [facility];  Service: Endoscopy;  Laterality: N/A;  . ESOPHAGOGASTRODUODENOSCOPY N/A 04/04/2016   Procedure: ESOPHAGOGASTRODUODENOSCOPY (EGD);  Surgeon: Lollie Sails, MD;  Location: [facility] ENDOSCOPY;  Service: Endoscopy;  Laterality: N/A;  . kidney stone removal     . MASTECTOMY Bilateral 2002,2010  . salpingo oophorectmy   1973    Family History  Problem Relation Age of Onset  . Hypertension Mother   . Alzheimer's disease Mother   . Cancer Father        prostate  . Cancer Sister        breast  . Hypertension Sister   . Cancer Brother        stomach  . Hypertension Brother   . Sarcoidosis Other        positive family history.  . Cancer Sister        breast  . Cancer Sister        breast    Social History:  reports that she quit smoking  about 16 years ago. Her smoking use included Cigarettes. She has a 52.50 pack-year smoking history. She has never used smokeless tobacco. She reports that she does not drink alcohol or use drugs.  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  She quit smoking in 2003.  She lives in [place].  She belongs to the [employer].  The patient is alone today.  Allergies:  Allergies  Allergen Reactions  . Sulfa Antibiotics Swelling  . Hydrocodone-Ibuprofen Other (See Comments)    Patient feels like there is a heavy wight on her chest  . Levofloxacin Hives  . Statins Other (See Comments)    Leg cramps and groin pain  . Other Itching    Tree Nuts  . Tape Rash    Current Medications: Current Outpatient Prescriptions  Medication Sig Dispense Refill  . Calcium Carbonate-Vit D-Min (CALCIUM 1200 PO) Take 1 tablet by mouth daily. Reported on 07/09/2015    . Cholecalciferol (VITAMIN D3 PO) Take 500 mg by mouth daily.    Marland Kitchen latanoprost (XALATAN) 0.005 % ophthalmic solution Place 1 drop into both eyes at bedtime.    . vitamin C (ASCORBIC ACID) 500 MG tablet Take 500 mg by mouth daily.    . ILEVRO 0.3 % ophthalmic suspension BEGINNING 2 DAYS B SURGERY INSTILL 1 DROP IN OPERATIVE EYE QD AND USE 1 DROP IN OPERATIVE EYE THE MORNING OF SURGERY  0  . timolol (TIMOPTIC) 0.5 % ophthalmic solution INT 1 GTT IN OD BID  5   No current facility-administered medications for this visit.     Review of Systems:  GENERAL:  Feels "ok".  No fevers or sweats.  Weight loss of 2 pounds since last visit. PERFORMANCE STATUS (ECOG):  1 HEENT:  No visual changes, runny nose, sore throat, mouth sores or tenderness. Lungs: No shortness of breath or cough.  No hemoptysis. Cardiac:  No chest pain, palpitations, orthopnea, or PND. GI:  Abdominal pain and diarrhea.  H pylori treatment x 3 (see HPI).  No nausea, vomiting, constipation, melena or hematochezia. GU:  No urgency, frequency, dysuria, or hematuria. Musculoskeletal:   Achy.  No back pain.  No joint pain.  No muscle tenderness. Extremities:  Toes dark after wearing shoes. Skin:  No rashes or skin changes. Neuro:  No headache, numbness or weakness, balance or coordination issues. Endocrine:  No diabetes, thyroid issues, hot flashes or night sweats. Psych:  No mood changes, depression or anxiety.  Poor sleep. Pain:  Abdominal pain (3 out of 10). Review of systems:  All other systems reviewed and found to be negative.  Physical Exam:  Blood pressure 105/67, pulse 80,  temperature (!) 96.5 F (35.8 C), temperature source Tympanic, resp. rate 18, weight 110 lb 9 oz (50.2 kg). GENERAL:  Thin woman sitting comfortably in the exam room in no acute distress. MENTAL STATUS:  Alert and oriented to person, place and time. HEAD:  Brown straight jaw length hair.  Normocephalic, atraumatic, face symmetric, no Cushingoid features. EYES:  Glasses.  Brown eyes.  Pupils equal round and reactive to light and accomodation.  No conjunctivitis or scleral icterus. ENT:  Oropharynx clear without lesion.  Tongue normal. Mucous membranes moist.  RESPIRATORY:  Clear to auscultation without rales, wheezes or rhonchi. CARDIOVASCULAR:  Regular rate and rhythm without murmur, rub or gallop. BREAST:  Bilateral mastectomies.  No erythema or nodularity.  ABDOMEN:  Soft, non-tender, with active bowel sounds, and no hepatosplenomegaly.  No masses. SKIN:  No rashes, ulcers or lesions. EXTREMITIES: Wearing a left sided lymphedema sleeve.  No skin discoloration or tenderness.  No palpable cords. LYMPH NODES: No palpable cervical, supraclavicular, axillary or inguinal adenopathy  NEUROLOGICAL: Unremarkable. PSYCH:  Appropriate.   Appointment on 10/20/2016  Component Date Value Ref Range Status  . WBC 10/20/2016 2.9* 3.6 - 11.0 K/uL Final  . RBC 10/20/2016 3.91  3.80 - 5.20 MIL/uL Final  . Hemoglobin 10/20/2016 12.7  12.0 - 16.0 g/dL Final  . HCT 10/20/2016 37.7  35.0 - 47.0 % Final  . MCV  10/20/2016 96.4  80.0 - 100.0 fL Final  . MCH 10/20/2016 32.5  26.0 - 34.0 pg Final  . MCHC 10/20/2016 33.7  32.0 - 36.0 g/dL Final  . RDW 10/20/2016 13.0  11.5 - 14.5 % Final  . Platelets 10/20/2016 131* 150 - 440 K/uL Final  . Neutrophils Relative % 10/20/2016 57  % Final  . Neutro Abs 10/20/2016 1.7  1.4 - 6.5 K/uL Final  . Lymphocytes Relative 10/20/2016 36  % Final  . Lymphs Abs 10/20/2016 1.1  1.0 - 3.6 K/uL Final  . Monocytes Relative 10/20/2016 5  % Final  . Monocytes Absolute 10/20/2016 0.1* 0.2 - 0.9 K/uL Final  . Eosinophils Relative 10/20/2016 1  % Final  . Eosinophils Absolute 10/20/2016 0.0  0 - 0.7 K/uL Final  . Basophils Relative 10/20/2016 1  % Final  . Basophils Absolute 10/20/2016 0.0  0 - 0.1 K/uL Final  . Sodium 10/20/2016 136  135 - 145 mmol/L Final  . Potassium 10/20/2016 3.6  3.5 - 5.1 mmol/L Final  . Chloride 10/20/2016 102  101 - 111 mmol/L Final  . CO2 10/20/2016 29  22 - 32 mmol/L Final  . Glucose, Bld 10/20/2016 137* 65 - 99 mg/dL Final  . BUN 10/20/2016 10  6 - 20 mg/dL Final  . Creatinine, Ser 10/20/2016 0.80  0.44 - 1.00 mg/dL Final  . Calcium 10/20/2016 8.9  8.9 - 10.3 mg/dL Final  . Total Protein 10/20/2016 6.9  6.5 - 8.1 g/dL Final  . Albumin 10/20/2016 3.5  3.5 - 5.0 g/dL Final  . AST 10/20/2016 23  15 - 41 U/L Final  . ALT 10/20/2016 13* 14 - 54 U/L Final  . Alkaline Phosphatase 10/20/2016 68  38 - 126 U/L Final  . Total Bilirubin 10/20/2016 0.7  0.3 - 1.2 mg/dL Final  . GFR calc non Af Amer 10/20/2016 >60  >60 mL/min Final  . GFR calc Af Amer 10/20/2016 >60  >60 mL/min Final   Comment: (NOTE) The eGFR has been calculated using the CKD EPI equation. This calculation has not been validated in all clinical situations.  eGFR's persistently <60 mL/min signify possible Chronic Kidney Disease.   . Anion gap 10/20/2016 5  5 - 15 Final    Assessment:  Amy Bird is a 66 y.o. female with a history of left breast cancer (2002), right sided stage  III colon cancer (2014), and mild pancytopenia.  She was diagnosed with left breast cancer in 2002.  She underwent left mastectomy in 03/2000 (no pathology available).  She received Adriamycin and Cytoxan [facility]) which completed in 07/2000 followed by Herceptin and Taxotere in 08/2000.  She did not receive hormonal therapy.  She underwent right sided mastectomy in 02/2008.  CA27.29 was 40.9 on 06/06/2010, 42.6 on 02/04/2011, 35.3 on 03/31/2012, 38.1 on 11/09/2012, 52.3 on 09/17/2015, 50.5 on 10/01/2015, 47.8 on 02/11/2016, 36.1 on 06/20/2016, and 36.6 on 10/20/2016.  She has intermittent anemia, neutropenia, and thrombocytopenia since 10/2012.  Bone marrow aspiration and biopsy in 10/2012 was negative for any myelodysplastic syndrome.  Bone marrow revealed no iron storage.  CBC on 09/17/2015 revealed a hematocrit 39.6, hemoglobin 13.3, MCV 96, platelets 128,000, white count 3000 with ANC of 1700. B12 was 266 (low normal) with a normal MMA on 09/19/2015.  Normal studies included:  folate, TSH, free T4.  Ferritin was 20.    She was diagnosed with colon cancer in 12/2012.  She underwent right hemicolectomy on 12/24/2012.  Pathology revealed a 3.7 cm grade II adenocarcinoma which extended through the muscularis propria and into the adjacent adipose.  One of 27 lymph nodes were positive.  Pathologic stage III ([id number]).    She was treated on CALGB protocol with FOLFOX chemotherapy +/- celecoxib.  She withdrew from the study in 02/25/2013.  She described trouble with counts.  She received 6 cycles of FOLFOX (01/28/2013 - 04/29/2013).  Colonoscopy on 04/04/2016 revealed a 2 mm polyp in the distal sigmoid colon.  Pathology revealed a serrated polyp.  EGD on 04/04/2016 revealed LA grade A esophagitis, non-bleeding erosive gastropathy, gastritis, and duodenitis.  Gastric and GEJ pathology revealed moderate chronic active inflammation, H pylori associated.  CEA was 3.8 on 12/09/2012, 6.3 on 01/27/2013, 10.6 on  05/26/2013, 5.9 on 09/22/2013, 7.1 on 05/10/2014, 7.0 on 11/08/2014, 5.8 on 05/08/2015, 7.2 on 09/17/2015, 5.9 on 10/01/2015, 6.5 on 02/11/2016, and 6.9 on 10/20/2016.  PET scan on 10/09/2015 revealed no evidence of recurrent disease.  Chest, abdomen, and pelvic CT scan on 02/15/2016 revealed no findings of recurrent malignancy.  There were radiation therapy related opacities in the left lung.  There was prominent stool throughout the colon.  There was lumbar degenerative disc disease.  She notes gradual weight loss since discontinuation of an anti-depressant years ago.  She previously weighed 110 pounds. TSH and free T4 were normal on 02/11/2016.  Labs on 02/26/2016 revealed a normal cortisol, sed rate, and ANA.  HIV testing was negative.  Left lower extremity duplex on 09/19/2015 revealed no evidence of DVT.  She had a left popliteal fossa Baker's cyst.  She has been diagnosed with H pylori (stool antigen + 05/17/2016) and treated x 3.  Symptomatically, she is having abdominal pain and diarrhea. She has lost weight. She is scheduled to see GI at [facility] this week for persistent H pylori.  Exam is unremarkable. WBC is 2900 with an [place] of 1700. Hemoglobin 12.7, hematocrit 37.7, platelets 131,000. Chemistries are unremarkable.   Plan: 1.  Labs today:  CBC with diff, CMP, CA27.29, CEA. 2.  Discuss H. pylori and treatment. 3.  Follow-up with GI at [facility] as already scheduled.  4.  Discuss caloric intake. Continue increased protein and calorie intake. Increase Boost/Ensure shakes. 5.  RTC in 4 months for MD assessment and labs (CBC with diff, CMP, CA27.29, CEA).   Honor Loh, NP  10/20/2016, 4:00 PM   I saw and evaluated the patient, participating in the key portions of the service and reviewing pertinent diagnostic studies and records.  I reviewed the nurse practitioner's note and agree with the findings and the plan.  The assessment and plan were discussed with the patient.  A few questions were asked  by the patient and answered.   Nolon Stalls, MD 10/20/2016, 4:00 PM

## 2016-10-21 LAB — CANCER ANTIGEN 27.29: CA 27.29: 36.6 U/mL (ref 0.0–38.6)

## 2016-10-21 LAB — CEA: CEA: 6.9 ng/mL — ABNORMAL HIGH (ref 0.0–4.7)

## 2016-11-12 ENCOUNTER — Telehealth: Payer: Self-pay | Admitting: *Deleted

## 2016-11-12 NOTE — Telephone Encounter (Signed)
Per GI path at [facility], she does not have H Pylori and she continues to lose weight, she is down to 108 pounds. She is concerned about this. Please advise

## 2016-11-13 ENCOUNTER — Encounter: Payer: Self-pay | Admitting: Hematology and Oncology

## 2016-11-13 NOTE — Telephone Encounter (Signed)
  Please call patient.  Follow-up with GI recommendations s/p recent clinic note and EGD.  RTC in 2-4 weeks to reassess.  M

## 2016-11-13 NOTE — Telephone Encounter (Signed)
Patient will follow up with [facility] and scheduled follow up with Dr Mike Gip for 11/16 @ 11:30 AM

## 2016-12-05 ENCOUNTER — Encounter: Payer: Self-pay | Admitting: Hematology and Oncology

## 2016-12-05 ENCOUNTER — Other Ambulatory Visit: Payer: Self-pay

## 2016-12-05 ENCOUNTER — Inpatient Hospital Stay: Payer: Medicare Other | Attending: Hematology and Oncology | Admitting: Hematology and Oncology

## 2016-12-05 ENCOUNTER — Other Ambulatory Visit: Payer: Self-pay | Admitting: *Deleted

## 2016-12-05 VITALS — Resp 12 | Ht 62.0 in | Wt 113.9 lb

## 2016-12-05 DIAGNOSIS — K2980 Duodenitis without bleeding: Secondary | ICD-10-CM | POA: Diagnosis not present

## 2016-12-05 DIAGNOSIS — C182 Malignant neoplasm of ascending colon: Secondary | ICD-10-CM

## 2016-12-05 DIAGNOSIS — D125 Benign neoplasm of sigmoid colon: Secondary | ICD-10-CM | POA: Diagnosis not present

## 2016-12-05 DIAGNOSIS — Z923 Personal history of irradiation: Secondary | ICD-10-CM | POA: Insufficient documentation

## 2016-12-05 DIAGNOSIS — Z9221 Personal history of antineoplastic chemotherapy: Secondary | ICD-10-CM | POA: Diagnosis not present

## 2016-12-05 DIAGNOSIS — Z85038 Personal history of other malignant neoplasm of large intestine: Secondary | ICD-10-CM | POA: Diagnosis not present

## 2016-12-05 DIAGNOSIS — D61818 Other pancytopenia: Secondary | ICD-10-CM | POA: Diagnosis not present

## 2016-12-05 DIAGNOSIS — Z853 Personal history of malignant neoplasm of breast: Secondary | ICD-10-CM | POA: Diagnosis not present

## 2016-12-05 DIAGNOSIS — H409 Unspecified glaucoma: Secondary | ICD-10-CM | POA: Insufficient documentation

## 2016-12-05 DIAGNOSIS — Z9012 Acquired absence of left breast and nipple: Secondary | ICD-10-CM | POA: Diagnosis not present

## 2016-12-05 DIAGNOSIS — Z79899 Other long term (current) drug therapy: Secondary | ICD-10-CM | POA: Diagnosis not present

## 2016-12-05 DIAGNOSIS — Z87891 Personal history of nicotine dependence: Secondary | ICD-10-CM | POA: Diagnosis not present

## 2016-12-05 DIAGNOSIS — Z9842 Cataract extraction status, left eye: Secondary | ICD-10-CM | POA: Diagnosis not present

## 2016-12-05 DIAGNOSIS — K2970 Gastritis, unspecified, without bleeding: Secondary | ICD-10-CM | POA: Insufficient documentation

## 2016-12-05 DIAGNOSIS — M5136 Other intervertebral disc degeneration, lumbar region: Secondary | ICD-10-CM | POA: Diagnosis not present

## 2016-12-05 DIAGNOSIS — E785 Hyperlipidemia, unspecified: Secondary | ICD-10-CM | POA: Diagnosis not present

## 2016-12-05 DIAGNOSIS — E538 Deficiency of other specified B group vitamins: Secondary | ICD-10-CM

## 2016-12-05 DIAGNOSIS — D72819 Decreased white blood cell count, unspecified: Secondary | ICD-10-CM

## 2016-12-05 DIAGNOSIS — R634 Abnormal weight loss: Secondary | ICD-10-CM

## 2016-12-05 NOTE — Progress Notes (Signed)
[facility] day:  12/05/2016  Chief Complaint: Amy Bird is a 66 y.o. female with a history of left breast cancer (2002), right sided colon cancer (2014), mild pancytopenia, and unexplained weight loss who is seen for 6 week assessment.  HPI:  The patient was last seen in the medical oncology clinic on 10/20/2016.  At that time, she was having abdominal pain and diarrhea. She had lost weight. She was scheduled to see GI at [facility] for persistent H pylori.  Exam was unremarkable. WBC was 2900 with an ANC of 1700. Hemoglobin was 12.7, hematocrit 37.7, platelets 131,000. Chemistries were unremarkable.   She saw Dr. Fuller Mandril, at [facility] on 10/21/2016.  EGD was recommended as well as a trial of FDgard for dyspepsia symptoms and imodium for diarrhea.  PPI was to be held.  Her diarrhea was felt possibly related to either bile acid diarrhea or SIBO (related to absence of ICV), or both.  He recommended empiric therapy with rifaximin, but suggested that waiting until after her repeat EGD. Bile acid sequestrant therapy may also be helpful, but would be tried as 2nd line.   EGD on 10/30/2016 revealed a normal esophagus and no gross lesions in the stomach. Biopsied for histology and culture/sensitivity (unavailable).  Notes on 11/12/2016 revealed she was still losing weight, having a sour taste in her mouth, did not feel good overall, and had achy pain which would come and go in her lower abdomen.  Symptomatically, patient doing well.  She is eating well and gaining weight. She notes that her weight started to increase following a 14 day course of Xifaxan. Patient's bowels have stabilized. She notes that her appetite is "so much better".    Past Medical History:  Diagnosis Date  . Abdominal pain 2013  . Anemia   . Arthritis   . Bleeding nose   . Blood in urine   . Breast cancer, left breast ([place]) 2002   Left Mastectomy, chemo + Rad tx's.  . Breast cyst   .  Bronchitis   . Bruises easily   . Colon cancer ([place]) 10/2012   Partial colon resection and chemo tx's.  . Colon cancer ([place])   . Difficulty urinating   . Diverticulitis   . Glaucoma   . Hearing loss   . Hyperlipidemia 20 years  . Kidney stone   . Lymphedema of left arm   . Neuromuscular disorder ([place])    neuropathy  feet and hands from chemo tx  . Pneumonia   . Shingles   . Swollen lymph nodes   . Vertigo    last episode around Jan 2018    Past Surgical History:  Procedure Laterality Date  . ABDOMINAL HYSTERECTOMY  1973  . APPENDECTOMY  1969  . CATARACT EXTRACTION PHACO AND INTRAOCULAR LENS PLACEMENT ([place]) Right 08/27/2016   Performed by Leandrew Koyanagi, MD at [place]  . CATARACT EXTRACTION PHACO AND INTRAOCULAR LENS PLACEMENT ([place]) left eye Left 07/11/2015   Performed by Leandrew Koyanagi, MD at [place]  . CESAREAN SECTION    . COLON RESECTION    . COLONOSCOPY  2004  . COLONOSCOPY N/A 04/04/2016   Performed by Lollie Sails, MD at [place]  . ESOPHAGOGASTRODUODENOSCOPY (EGD) N/A 04/04/2016   Performed by Lollie Sails, MD at [facility] ENDOSCOPY  . kidney stone removal     . MASTECTOMY Bilateral 2002,2010  . salpingo oophorectmy   1973  Family History  Problem Relation Age of Onset  . Hypertension Mother   . Alzheimer's disease Mother   . Cancer Father        prostate  . Cancer Sister        breast  . Hypertension Sister   . Cancer Brother        stomach  . Hypertension Brother   . Sarcoidosis Other        positive family history.  . Cancer Sister        breast  . Cancer Sister        breast    Social History:  reports that she quit smoking about 16 years ago. Her smoking use included cigarettes. She has a 52.50 pack-year smoking history. she has never used smokeless tobacco. She reports that she does not drink alcohol or use drugs.  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  She quit smoking in 2003.  She lives in  [place].  She belongs to the [employer].  The patient is alone today.  Allergies:  Allergies  Allergen Reactions  . Sulfa Antibiotics Swelling  . Hydrocodone-Ibuprofen Other (See Comments)    Patient feels like there is a heavy wight on her chest  . Levofloxacin Hives  . Statins Other (See Comments)    Leg cramps and groin pain  . Other Itching    Tree Nuts  . Tape Rash    Current Medications: Current Outpatient Medications  Medication Sig Dispense Refill  . vitamin C (ASCORBIC ACID) 500 MG tablet Take 500 mg by mouth daily.    . Calcium Carbonate-Vit D-Min (CALCIUM 1200 PO) Take 1 tablet by mouth daily. Reported on 07/09/2015    . Cholecalciferol (VITAMIN D3 PO) Take 500 mg by mouth daily.    Marland Kitchen latanoprost (XALATAN) 0.005 % ophthalmic solution Place 1 drop into both eyes at bedtime.     No current facility-administered medications for this visit.     Review of Systems:  GENERAL:  Feels "much better".  No fevers or sweats.  Weight up 3 pounds since last visit. PERFORMANCE STATUS (ECOG):  1 HEENT:  No visual changes, runny nose, sore throat, mouth sores or tenderness. Lungs: No shortness of breath or cough.  No hemoptysis. Cardiac:  No chest pain, palpitations, orthopnea, or PND. GI:  Appetite better.  Eating and gaining weight.  No nausea, vomiting, constipation, melena or hematochezia. GU:  No urgency, frequency, dysuria, or hematuria. Musculoskeletal:  Achy.  No back pain.  No joint pain.  No muscle tenderness. Extremities:  Toes dark after wearing shoes. Skin:  No rashes or skin changes. Neuro:  No headache, numbness or weakness, balance or coordination issues. Endocrine:  No diabetes, thyroid issues, hot flashes or night sweats. Psych:  No mood changes, depression or anxiety.  Poor sleep. Pain:  No pain. Review of systems:  All other systems reviewed and found to be negative.  Physical Exam:  Resp. rate 12, height 5' 2" (1.575 m), weight 113 lb 14.4 oz  (51.7 kg). GENERAL:  Thin woman sitting comfortably in the exam room in no acute distress. MENTAL STATUS:  Alert and oriented to person, place and time. HEAD:  Brown straight jaw length hair.  Normocephalic, atraumatic, face symmetric, no Cushingoid features. EYES:  Glasses.  Brown eyes.  Pupils equal round and reactive to light and accomodation.  No conjunctivitis or scleral icterus. ENT:  Oropharynx clear without lesion.  Tongue normal. Mucous membranes moist.  RESPIRATORY:  Clear to  auscultation without rales, wheezes or rhonchi. CARDIOVASCULAR:  Regular rate and rhythm without murmur, rub or gallop. ABDOMEN:  Soft, non-tender, with active bowel sounds, and no hepatosplenomegaly.  No masses. SKIN:  No rashes, ulcers or lesions. EXTREMITIES: No skin discoloration or tenderness.  No palpable cords. LYMPH NODES: No palpable cervical, supraclavicular, axillary or inguinal adenopathy  NEUROLOGICAL: Unremarkable. PSYCH:  Appropriate.   No visits with results within 3 Day(s) from this visit.  Latest known visit with results is:  Appointment on 10/20/2016  Component Date Value Ref Range Status  . CA 27.29 10/20/2016 36.6  0.0 - 38.6 U/mL Final   Comment: (NOTE) Bayer Centaur/ACS methodology Performed At: [facility] [place], [place] [id number] Lindon Romp MD [id number]   . WBC 10/20/2016 2.9* 3.6 - 11.0 K/uL Final  . RBC 10/20/2016 3.91  3.80 - 5.20 MIL/uL Final  . Hemoglobin 10/20/2016 12.7  12.0 - 16.0 g/dL Final  . HCT 10/20/2016 37.7  35.0 - 47.0 % Final  . MCV 10/20/2016 96.4  80.0 - 100.0 fL Final  . MCH 10/20/2016 32.5  26.0 - 34.0 pg Final  . MCHC 10/20/2016 33.7  32.0 - 36.0 g/dL Final  . RDW 10/20/2016 13.0  11.5 - 14.5 % Final  . Platelets 10/20/2016 131* 150 - 440 K/uL Final  . Neutrophils Relative % 10/20/2016 57  % Final  . Neutro Abs 10/20/2016 1.7  1.4 - 6.5 K/uL Final  . Lymphocytes Relative 10/20/2016 36  % Final  . Lymphs Abs  10/20/2016 1.1  1.0 - 3.6 K/uL Final  . Monocytes Relative 10/20/2016 5  % Final  . Monocytes Absolute 10/20/2016 0.1* 0.2 - 0.9 K/uL Final  . Eosinophils Relative 10/20/2016 1  % Final  . Eosinophils Absolute 10/20/2016 0.0  0 - 0.7 K/uL Final  . Basophils Relative 10/20/2016 1  % Final  . Basophils Absolute 10/20/2016 0.0  0 - 0.1 K/uL Final  . CEA 10/20/2016 6.9* 0.0 - 4.7 ng/mL Final   Comment: (NOTE)       Roche ECLIA methodology       Nonsmokers  <3.9                                     Smokers     <5.6 Performed At: [facility] [place], [place] [id number] Lindon Romp MD [id number]:[id number]   . Sodium 10/20/2016 136  135 - 145 mmol/L Final  . Potassium 10/20/2016 3.6  3.5 - 5.1 mmol/L Final  . Chloride 10/20/2016 102  101 - 111 mmol/L Final  . CO2 10/20/2016 29  22 - 32 mmol/L Final  . Glucose, Bld 10/20/2016 137* 65 - 99 mg/dL Final  . BUN 10/20/2016 10  6 - 20 mg/dL Final  . Creatinine, Ser 10/20/2016 0.80  0.44 - 1.00 mg/dL Final  . Calcium 10/20/2016 8.9  8.9 - 10.3 mg/dL Final  . Total Protein 10/20/2016 6.9  6.5 - 8.1 g/dL Final  . Albumin 10/20/2016 3.5  3.5 - 5.0 g/dL Final  . AST 10/20/2016 23  15 - 41 U/L Final  . ALT 10/20/2016 13* 14 - 54 U/L Final  . Alkaline Phosphatase 10/20/2016 68  38 - 126 U/L Final  . Total Bilirubin 10/20/2016 0.7  0.3 - 1.2 mg/dL Final  . GFR calc non Af Amer 10/20/2016 >60  >60 mL/min Final  . GFR calc Af Wyvonnia Lora  10/20/2016 >60  >60 mL/min Final   Comment: (NOTE) The eGFR has been calculated using the CKD EPI equation. This calculation has not been validated in all clinical situations. eGFR's persistently <60 mL/min signify possible Chronic Kidney Disease.   . Anion gap 10/20/2016 5  5 - 15 Final    Assessment:  Amy Bird is a 67 y.o. female with a history of left breast cancer (2002), right sided stage III colon cancer (2014), and mild pancytopenia.  She was diagnosed with left breast cancer in 2002.  She  underwent left mastectomy in 03/2000 (no pathology available).  She received Adriamycin and Cytoxan [facility]) which completed in 07/2000 followed by Herceptin and Taxotere in 08/2000.  She did not receive hormonal therapy.  She underwent right sided mastectomy in 02/2008.  CA27.29 was 40.9 on 06/06/2010, 42.6 on 02/04/2011, 35.3 on 03/31/2012, 38.1 on 11/09/2012, 52.3 on 09/17/2015, 50.5 on 10/01/2015, 47.8 on 02/11/2016, 36.1 on 06/20/2016, and 36.6 on 10/20/2016.  She has intermittent anemia, neutropenia, and thrombocytopenia since 10/2012.  Bone marrow aspiration and biopsy in 10/2012 was negative for any myelodysplastic syndrome.  Bone marrow revealed no iron storage.  CBC on 09/17/2015 revealed a hematocrit 39.6, hemoglobin 13.3, MCV 96, platelets 128,000, white count 3000 with ANC of 1700. B12 was 266 (low normal) with a normal MMA on 09/19/2015.  Normal studies included:  folate, TSH, free T4.  Ferritin was 20.    She was diagnosed with colon cancer in 12/2012.  She underwent right hemicolectomy on 12/24/2012.  Pathology revealed a 3.7 cm grade II adenocarcinoma which extended through the muscularis propria and into the adjacent adipose.  One of 27 lymph nodes were positive.  Pathologic stage III ([id number]).    She was treated on CALGB protocol with FOLFOX chemotherapy +/- celecoxib.  She withdrew from the study in 02/25/2013.  She described trouble with counts.  She received 6 cycles of FOLFOX (01/28/2013 - 04/29/2013).  Colonoscopy on 04/04/2016 revealed a 2 mm polyp in the distal sigmoid colon.  Pathology revealed a serrated polyp.  EGD on 04/04/2016 revealed LA grade A esophagitis, non-bleeding erosive gastropathy, gastritis, and duodenitis.  Gastric and GEJ pathology revealed moderate chronic active inflammation, H pylori associated.  CEA was 3.8 on 12/09/2012, 6.3 on 01/27/2013, 10.6 on 05/26/2013, 5.9 on 09/22/2013, 7.1 on 05/10/2014, 7.0 on 11/08/2014, 5.8 on 05/08/2015, 7.2 on 09/17/2015, 5.9  on 10/01/2015, 6.5 on 02/11/2016, and 6.9 on 10/20/2016.  PET scan on 10/09/2015 revealed no evidence of recurrent disease.  Chest, abdomen, and pelvic CT scan on 02/15/2016 revealed no findings of recurrent malignancy.  There were radiation therapy related opacities in the left lung.  There was prominent stool throughout the colon.  There was lumbar degenerative disc disease.  She notes gradual weight loss since discontinuation of an anti-depressant years ago.  She previously weighed 110 pounds. TSH and free T4 were normal on 02/11/2016.  Labs on 02/26/2016 revealed a normal cortisol, sed rate, and ANA.  HIV testing was negative.  Left lower extremity duplex on 09/19/2015 revealed no evidence of DVT.  She had a left popliteal fossa Baker's cyst.  She has been diagnosed with H pylori (stool antigen + 05/17/2016) and treated x 3.  Symptomatically, patient is doing well. She is not having any abdominal pain or diarrhea.  She is gaining weight.   Exam is unremarkable.   Plan: 1.  Review interim events. 2.  Discuss caloric intake. Continue increased protein and calorie intake. Increase Boost/Ensure shakes. 3.  RTC on 02/23/2017 for MD assessment and labs (CBC with diff, CMP, CA27.29, CEA).   Honor Loh, NP  12/05/2016, 12:02 PM   I saw and evaluated the patient, participating in the key portions of the service and reviewing pertinent diagnostic studies and records.  I reviewed the nurse practitioner's note and agree with the findings and the plan.  The assessment and plan were discussed with the patient.  A few questions were asked by the patient and answered.   Nolon Stalls, MD 12/05/2016, 12:02 PM

## 2016-12-05 NOTE — Progress Notes (Signed)
Patient here for follow up no changes since since last appt. Patient continues to have recurring rash on her right buttock.

## 2017-01-06 ENCOUNTER — Telehealth: Payer: Self-pay | Admitting: *Deleted

## 2017-01-06 NOTE — Telephone Encounter (Signed)
-----   Message from Karen Kitchens, NP sent at 01/06/2017  4:30 AM EST ----- Regarding: RE: symptoms I would tell her to follow up with GI. She has been treated for h.pylori recently. I'm unsure if she has had repeat testing for resolution since she completed her course of Xifaxin. She was feeling better and gaining weight when we saw her a month ago. Sounds like GI needs to reevaluate.   Gaspar Bidding ----- Message ----- From: Shawnee Knapp, RN Sent: 01/05/2017   3:50 PM To: Karen Kitchens, NP, Shirlean Kelly, RN Subject: symptoms                                       Patient called to state she is not feeling well, she is not eating, and is losing weight again.  She feels weak and wanted [place] to be aware.  Do you want to see her?  She is not scheduled until February for follow up.

## 2017-01-06 NOTE — Telephone Encounter (Signed)
This has been handled with another staff member

## 2017-01-06 NOTE — Telephone Encounter (Signed)
Called patient to advise her to follow up with GI for her weight loss.  Patient recently treated for H. Pylori.  Patient verbalized understanding.

## 2017-01-26 ENCOUNTER — Telehealth: Payer: Self-pay | Admitting: *Deleted

## 2017-01-26 NOTE — Telephone Encounter (Signed)
Copied from [place] [id number]. Topic: Appointment Scheduling - Scheduling Inquiry for Clinic >> Jan 26, 2017 11:27 AM Scherrie Gerlach wrote: Reason for CRM: pt states she used to see Dr Silvio Pate at another office before he came to [place]. Would like to know if Dr Silvio Pate will accept her back and allow her to have an appt prior to May 2019 when that is first available.

## 2017-01-26 NOTE — Telephone Encounter (Signed)
I am not sure when I last saw her ([place]?)----okay to set up new patient appt (30 minutes) when you can fit her in

## 2017-01-27 NOTE — Telephone Encounter (Signed)
Patient scheduled appointment on 03/19/17.

## 2017-02-06 ENCOUNTER — Ambulatory Visit: Payer: Self-pay | Admitting: Family Medicine

## 2017-02-23 ENCOUNTER — Other Ambulatory Visit: Payer: Self-pay | Admitting: Hematology and Oncology

## 2017-02-23 ENCOUNTER — Other Ambulatory Visit: Payer: Medicare Other

## 2017-02-23 ENCOUNTER — Inpatient Hospital Stay: Payer: Medicare Other | Attending: Hematology and Oncology

## 2017-02-23 ENCOUNTER — Inpatient Hospital Stay (HOSPITAL_BASED_OUTPATIENT_CLINIC_OR_DEPARTMENT_OTHER): Payer: Medicare Other | Admitting: Hematology and Oncology

## 2017-02-23 ENCOUNTER — Ambulatory Visit: Payer: Medicare Other | Admitting: Hematology and Oncology

## 2017-02-23 ENCOUNTER — Encounter: Payer: Self-pay | Admitting: Hematology and Oncology

## 2017-02-23 VITALS — BP 110/69 | HR 79 | Temp 97.0°F | Resp 15 | Wt 113.4 lb

## 2017-02-23 DIAGNOSIS — I890 Lymphedema, not elsewhere classified: Secondary | ICD-10-CM | POA: Insufficient documentation

## 2017-02-23 DIAGNOSIS — C182 Malignant neoplasm of ascending colon: Secondary | ICD-10-CM

## 2017-02-23 DIAGNOSIS — Z853 Personal history of malignant neoplasm of breast: Secondary | ICD-10-CM | POA: Insufficient documentation

## 2017-02-23 DIAGNOSIS — E538 Deficiency of other specified B group vitamins: Secondary | ICD-10-CM

## 2017-02-23 DIAGNOSIS — Z85038 Personal history of other malignant neoplasm of large intestine: Secondary | ICD-10-CM | POA: Diagnosis not present

## 2017-02-23 DIAGNOSIS — Z9012 Acquired absence of left breast and nipple: Secondary | ICD-10-CM | POA: Insufficient documentation

## 2017-02-23 DIAGNOSIS — D61818 Other pancytopenia: Secondary | ICD-10-CM | POA: Diagnosis not present

## 2017-02-23 DIAGNOSIS — D72819 Decreased white blood cell count, unspecified: Secondary | ICD-10-CM

## 2017-02-23 DIAGNOSIS — R634 Abnormal weight loss: Secondary | ICD-10-CM

## 2017-02-23 LAB — CBC WITH DIFFERENTIAL/PLATELET
Basophils Absolute: 0 10*3/uL (ref 0–0.1)
Basophils Relative: 1 %
Eosinophils Absolute: 0.1 10*3/uL (ref 0–0.7)
Eosinophils Relative: 2 %
HCT: 41.3 % (ref 35.0–47.0)
Hemoglobin: 13.7 g/dL (ref 12.0–16.0)
Lymphocytes Relative: 32 %
Lymphs Abs: 1 10*3/uL (ref 1.0–3.6)
MCH: 32.3 pg (ref 26.0–34.0)
MCHC: 33.2 g/dL (ref 32.0–36.0)
MCV: 97.3 fL (ref 80.0–100.0)
Monocytes Absolute: 0.2 10*3/uL (ref 0.2–0.9)
Monocytes Relative: 7 %
Neutro Abs: 1.8 10*3/uL (ref 1.4–6.5)
Neutrophils Relative %: 58 %
Platelets: 135 10*3/uL — ABNORMAL LOW (ref 150–440)
RBC: 4.24 MIL/uL (ref 3.80–5.20)
RDW: 13.2 % (ref 11.5–14.5)
WBC: 3 10*3/uL — ABNORMAL LOW (ref 3.6–11.0)

## 2017-02-23 LAB — COMPREHENSIVE METABOLIC PANEL
ALT: 13 U/L — ABNORMAL LOW (ref 14–54)
AST: 23 U/L (ref 15–41)
Albumin: 3.7 g/dL (ref 3.5–5.0)
Alkaline Phosphatase: 69 U/L (ref 38–126)
Anion gap: 8 (ref 5–15)
BUN: 18 mg/dL (ref 6–20)
CO2: 30 mmol/L (ref 22–32)
Calcium: 9 mg/dL (ref 8.9–10.3)
Chloride: 101 mmol/L (ref 101–111)
Creatinine, Ser: 0.8 mg/dL (ref 0.44–1.00)
GFR calc Af Amer: >60 mL/min (ref >60)
GFR calc non Af Amer: >60 mL/min (ref >60)
Glucose, Bld: 110 mg/dL — ABNORMAL HIGH (ref 65–99)
Potassium: 3.7 mmol/L (ref 3.5–5.1)
Sodium: 139 mmol/L (ref 135–145)
Total Bilirubin: 0.5 mg/dL (ref 0.3–1.2)
Total Protein: 7.3 g/dL (ref 6.5–8.1)

## 2017-02-23 LAB — VITAMIN B12: Vitamin B-12: 242 pg/mL (ref 180–914)

## 2017-02-23 NOTE — Progress Notes (Signed)
[facility] day:  02/23/2017  Chief Complaint: Amy Bird is a 67 y.o. female with a history of left breast cancer (2002), right sided colon cancer (2014), mild pancytopenia, and unexplained weight loss who is seen for 3 month assessment.  HPI:  The patient was last seen in the medical oncology clinic on 12/05/2016.  At that time, she was doing well. She was no longer having any abdominal pain or diarrhea.  She was gaining weight.   During the interim, patient has been doing "sort of ok". Patient experienced upper respiratory infection in January that was treated with two rounds of Azithromycin. Patient developed some diarrhea with the antibiotics. Patient lost down to 110 pounds, however returned to baseline. She is eating well. Her weight remains stable since her last visit.   Patient continues to pain intermittent pain in her lower extremities.  Patient attributes her pain to "neuropathy". She does not taking anything for her neuropathy citing that "it is not that bad". Patient with lymphedema in her LEFT upper extremity. She is wearing her sleeve  Patient denies fevers and sweats. Patient denies bleeding; no hematochezia, melena, or vaginal bleeding. Patient verbalizes no breast concerns. She denies pain in the clinic.    Past Medical History:  Diagnosis Date  . Abdominal pain 2013  . Anemia   . Arthritis   . Bleeding nose   . Blood in urine   . Breast cancer, left breast ([place]) 2002   Left Mastectomy, chemo + Rad tx's.  . Breast cyst   . Bronchitis   . Bruises easily   . Colon cancer ([place]) 10/2012   Partial colon resection and chemo tx's.  . Colon cancer ([place])   . Difficulty urinating   . Diverticulitis   . Glaucoma   . Hearing loss   . Hyperlipidemia 20 years  . Kidney stone   . Lymphedema of left arm   . Neuromuscular disorder ([place])    neuropathy  feet and hands from chemo tx  . Pneumonia   . Shingles   . Swollen lymph nodes    . Vertigo    last episode around Jan 2018    Past Surgical History:  Procedure Laterality Date  . ABDOMINAL HYSTERECTOMY  1973  . APPENDECTOMY  1969  . CATARACT EXTRACTION W/PHACO Left 07/11/2015   Procedure: CATARACT EXTRACTION PHACO AND INTRAOCULAR LENS PLACEMENT ([facility]) left eye;  Surgeon: Leandrew Koyanagi, MD;  Location: [place];  Service: Ophthalmology;  Laterality: Left;  . CATARACT EXTRACTION W/PHACO Right 08/27/2016   Procedure: CATARACT EXTRACTION PHACO AND INTRAOCULAR LENS PLACEMENT (I[facility]);  Surgeon: Leandrew Koyanagi, MD;  Location: [place];  Service: Ophthalmology;  Laterality: Right;  IVA TOPICAL RIGHT  . CESAREAN SECTION    . COLON RESECTION    . COLONOSCOPY  2004  . COLONOSCOPY N/A 04/04/2016   Procedure: COLONOSCOPY;  Surgeon: Lollie Sails, MD;  Location: [facility] ENDOSCOPY;  Service: Endoscopy;  Laterality: N/A;  . ESOPHAGOGASTRODUODENOSCOPY N/A 04/04/2016   Procedure: ESOPHAGOGASTRODUODENOSCOPY (EGD);  Surgeon: Lollie Sails, MD;  Location: [facility] ENDOSCOPY;  Service: Endoscopy;  Laterality: N/A;  . kidney stone removal     . MASTECTOMY Bilateral 2002,2010  . salpingo oophorectmy   1973    Family History  Problem Relation Age of Onset  . Hypertension Mother   . Alzheimer's disease Mother   . Cancer Father        prostate  . Cancer Sister  breast  . Hypertension Sister   . Cancer Brother        stomach  . Hypertension Brother   . Sarcoidosis Other        positive family history.  . Cancer Sister        breast  . Cancer Sister        breast    Social History:  reports that she quit smoking about 17 years ago. Her smoking use included cigarettes. She has a 52.50 pack-year smoking history. she has never used smokeless tobacco. She reports that she does not drink alcohol or use drugs.  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  She quit smoking in 2003.  She lives in [place].  She belongs to the [employer].  The  patient is alone today.  Allergies:  Allergies  Allergen Reactions  . Sulfa Antibiotics Swelling  . Hydrocodone-Ibuprofen Other (See Comments)    Patient feels like there is a heavy wight on her chest  . Levofloxacin Hives  . Statins Other (See Comments)    Leg cramps and groin pain  . Other Itching    Tree Nuts  . Tape Rash    Current Medications: Current Outpatient Medications  Medication Sig Dispense Refill  . Calcium Carbonate-Vit D-Min (CALCIUM 1200 PO) Take 1 tablet by mouth daily. Reported on 07/09/2015    . Cholecalciferol (VITAMIN D3 PO) Take 500 mg by mouth daily.    Marland Kitchen latanoprost (XALATAN) 0.005 % ophthalmic solution Place 1 drop into both eyes at bedtime.    . vitamin C (ASCORBIC ACID) 500 MG tablet Take 500 mg by mouth daily.     No current facility-administered medications for this visit.     Review of Systems:  GENERAL:  Feels "sort of ok".  No fevers or sweats.  Weight stable (see HPI).  PERFORMANCE STATUS (ECOG):  1 HEENT:  No visual changes, runny nose, sore throat, mouth sores or tenderness. Lungs: No shortness of breath or cough.  No hemoptysis. Cardiac:  No chest pain, palpitations, orthopnea, or PND. GI:  Appetite good.  Eating and gaining weight.  Interval diarrhea post antibiotics, resolved.  No nausea, vomiting, constipation, melena or hematochezia. GU:  No urgency, frequency, dysuria, or hematuria. Musculoskeletal:  Achy.  No back pain.  No joint pain.  No muscle tenderness. Extremities:  Lower extremity pain intermittent felt secondary to neuropathy.  Left upper extremity lymphedema. Skin:  No rashes or skin changes. Neuro:  Chronic lower extremity neuropathy (see HPI).  No headache, numbness or weakness, balance or coordination issues. Endocrine:  No diabetes, thyroid issues, hot flashes or night sweats. Psych:  No mood changes, depression or anxiety.   Pain:  No pain. Review of systems:  All other systems reviewed and found to be  negative.  Physical Exam:  Blood pressure 110/69, pulse 79, temperature (!) 97 F (36.1 C), temperature source Tympanic, resp. rate 15, weight 113 lb 6.4 oz (51.4 kg). GENERAL:  Thin woman sitting comfortably in the exam room in no acute distress. MENTAL STATUS:  Alert and oriented to person, place and time. HEAD:  Brown straight jaw length hair.  Normocephalic, atraumatic, face symmetric, no Cushingoid features. EYES:  Glasses.  Brown eyes.  Pupils equal round and reactive to light and accomodation.  No conjunctivitis or scleral icterus. ENT:  Oropharynx clear without lesion.  Tongue normal. Mucous membranes moist.  RESPIRATORY:  Clear to auscultation without rales, wheezes or rhonchi. CARDIOVASCULAR:  Regular rate and rhythm without murmur,  rub or gallop. ABDOMEN:  Soft, non-tender, with active bowel sounds, and no hepatosplenomegaly.  No masses. SKIN:  No rashes, ulcers or lesions. EXTREMITIES:  Wearing a loose left upper extremity lymphedema sleeve.  No skin discoloration or tenderness.  No palpable cords. LYMPH NODES: No palpable cervical, supraclavicular, axillary or inguinal adenopathy  NEUROLOGICAL: Unremarkable. PSYCH:  Appropriate.   Appointment on 02/23/2017  Component Date Value Ref Range Status  . WBC 02/23/2017 3.0* 3.6 - 11.0 K/uL Final  . RBC 02/23/2017 4.24  3.80 - 5.20 MIL/uL Final  . Hemoglobin 02/23/2017 13.7  12.0 - 16.0 g/dL Final  . HCT 02/23/2017 41.3  35.0 - 47.0 % Final  . MCV 02/23/2017 97.3  80.0 - 100.0 fL Final  . MCH 02/23/2017 32.3  26.0 - 34.0 pg Final  . MCHC 02/23/2017 33.2  32.0 - 36.0 g/dL Final  . RDW 02/23/2017 13.2  11.5 - 14.5 % Final  . Platelets 02/23/2017 135* 150 - 440 K/uL Final  . Neutrophils Relative % 02/23/2017 58  % Final  . Neutro Abs 02/23/2017 1.8  1.4 - 6.5 K/uL Final  . Lymphocytes Relative 02/23/2017 32  % Final  . Lymphs Abs 02/23/2017 1.0  1.0 - 3.6 K/uL Final  . Monocytes Relative 02/23/2017 7  % Final  . Monocytes  Absolute 02/23/2017 0.2  0.2 - 0.9 K/uL Final  . Eosinophils Relative 02/23/2017 2  % Final  . Eosinophils Absolute 02/23/2017 0.1  0 - 0.7 K/uL Final  . Basophils Relative 02/23/2017 1  % Final  . Basophils Absolute 02/23/2017 0.0  0 - 0.1 K/uL Final   Performed at [facility], [address], [place], [place] [address]  . Sodium 02/23/2017 139  135 - 145 mmol/L Final  . Potassium 02/23/2017 3.7  3.5 - 5.1 mmol/L Final  . Chloride 02/23/2017 101  101 - 111 mmol/L Final  . CO2 02/23/2017 30  22 - 32 mmol/L Final  . Glucose, Bld 02/23/2017 110* 65 - 99 mg/dL Final  . BUN 02/23/2017 18  6 - 20 mg/dL Final  . Creatinine, Ser 02/23/2017 0.80  0.44 - 1.00 mg/dL Final  . Calcium 02/23/2017 9.0  8.9 - 10.3 mg/dL Final  . Total Protein 02/23/2017 7.3  6.5 - 8.1 g/dL Final  . Albumin 02/23/2017 3.7  3.5 - 5.0 g/dL Final  . AST 02/23/2017 23  15 - 41 U/L Final  . ALT 02/23/2017 13* 14 - 54 U/L Final  . Alkaline Phosphatase 02/23/2017 69  38 - 126 U/L Final  . Total Bilirubin 02/23/2017 0.5  0.3 - 1.2 mg/dL Final  . GFR calc non Af Amer 02/23/2017 >60  >60 mL/min Final  . GFR calc Af Amer 02/23/2017 >60  >60 mL/min Final   Comment: (NOTE) The eGFR has been calculated using the CKD EPI equation. This calculation has not been validated in all clinical situations. eGFR's persistently <60 mL/min signify possible Chronic Kidney Disease.   Georgiann Hahn gap 02/23/2017 8  5 - 15 Final   Performed at [facility], [address] [place], [place] [address]    Assessment:  Amy Bird is a 67 y.o. female with a history of left breast cancer (2002), right sided stage III colon cancer (2014), and mild pancytopenia.  She was diagnosed with left breast cancer in 2002.  She underwent left mastectomy in 03/2000 (no pathology available).  She received Adriamycin and Cytoxan [facility]) which completed in 07/2000 followed by Herceptin and Taxotere in 08/2000.  She did not receive hormonal therapy.  She  underwent right sided mastectomy in 02/2008.  CA27.29 was 40.9 on 06/06/2010, 42.6 on 02/04/2011, 35.3 on 03/31/2012, 38.1 on 11/09/2012, 52.3 on 09/17/2015, 50.5 on 10/01/2015, 47.8 on 02/11/2016, 36.1 on 06/20/2016, and 36.6 on 10/20/2016.  She has intermittent anemia, neutropenia, and thrombocytopenia since 10/2012.  Bone marrow aspiration and biopsy in 10/2012 was negative for any myelodysplastic syndrome.  Bone marrow revealed no iron storage.  CBC on 09/17/2015 revealed a hematocrit 39.6, hemoglobin 13.3, MCV 96, platelets 128,000, white count 3000 with ANC of 1700. B12 was 266 (low normal) with a normal MMA on 09/19/2015.  Normal studies included:  folate, TSH, free T4.  Ferritin was 20.    She was diagnosed with colon cancer in 12/2012.  She underwent right hemicolectomy on 12/24/2012.  Pathology revealed a 3.7 cm grade II adenocarcinoma which extended through the muscularis propria and into the adjacent adipose.  One of 27 lymph nodes were positive.  Pathologic stage III ([id number]).    She was treated on CALGB protocol with FOLFOX chemotherapy +/- celecoxib.  She withdrew from the study in 02/25/2013.  She described trouble with counts.  She received 6 cycles of FOLFOX (01/28/2013 - 04/29/2013).  Colonoscopy on 04/04/2016 revealed a 2 mm polyp in the distal sigmoid colon.  Pathology revealed a serrated polyp.  EGD on 04/04/2016 revealed LA grade A esophagitis, non-bleeding erosive gastropathy, gastritis, and duodenitis.  Gastric and GEJ pathology revealed moderate chronic active inflammation, H pylori associated.  CEA was 3.8 on 12/09/2012, 6.3 on 01/27/2013, 10.6 on 05/26/2013, 5.9 on 09/22/2013, 7.1 on 05/10/2014, 7.0 on 11/08/2014, 5.8 on 05/08/2015, 7.2 on 09/17/2015, 5.9 on 10/01/2015, 6.5 on 02/11/2016, and 6.9 on 10/20/2016.  PET scan on 10/09/2015 revealed no evidence of recurrent disease.  Chest, abdomen, and pelvic CT scan on 02/15/2016 revealed no findings of recurrent malignancy.   There were radiation therapy related opacities in the left lung.  There was prominent stool throughout the colon.  There was lumbar degenerative disc disease.  She notes gradual weight loss since discontinuation of an anti-depressant years ago.  She previously weighed 110 pounds. TSH and free T4 were normal on 02/11/2016.  Labs on 02/26/2016 revealed a normal cortisol, sed rate, and ANA.  HIV testing was negative.  Left lower extremity duplex on 09/19/2015 revealed no evidence of DVT.  She had a left popliteal fossa Baker's cyst.  She has been diagnosed with H pylori (stool antigen + 05/17/2016) and treated x 3.  Symptomatically, patient is doing well. She is not having any abdominal pain or diarrhea.  Her weight is stable.  Exam is unremarkable.   Plan: 1.  Labs today: CBC with diff, CMP, CA27.29, CEA, copper, B12. 2.  Discuss mild pancytopenia.  Additional labs ordered today (copper and B12). 3.  Discuss lymphedema. Will provide Rx for new sleeve.  4.  RTC in 3 months for a weight check. 5.  RTC in 6 months for MD assessment, labs (CBC with diff, CMP, CA27.29, CEA).   Honor Loh, NP  02/23/2017, 10:06 AM   I saw and evaluated the patient, participating in the key portions of the service and reviewing pertinent diagnostic studies and records.  I reviewed the nurse practitioner's note and agree with the findings and the plan.  The assessment and plan were discussed with the patient.  A few questions were asked by the patient and answered.   Nolon Stalls, MD 02/23/2017, 10:06 AM

## 2017-02-24 ENCOUNTER — Telehealth: Payer: Self-pay | Admitting: *Deleted

## 2017-02-24 ENCOUNTER — Other Ambulatory Visit: Payer: Self-pay | Admitting: *Deleted

## 2017-02-24 DIAGNOSIS — C182 Malignant neoplasm of ascending colon: Secondary | ICD-10-CM

## 2017-02-24 LAB — CA 27.29 (SERIAL MONITOR): CA 27.29: 42.6 U/mL — ABNORMAL HIGH (ref 0.0–38.6)

## 2017-02-24 LAB — CEA: CEA: 7.6 ng/mL — ABNORMAL HIGH (ref 0.0–4.7)

## 2017-02-24 NOTE — Telephone Encounter (Signed)
-----   Message from Lequita Asal, MD sent at 02/24/2017  1:46 PM EST ----- Regarding: Please call patient  Repeat CA27.29 in 1 month.  M  ----- Message ----- From: Interface, Lab In [place] Sent: 02/23/2017   9:37 AM To: Lequita Asal, MD

## 2017-02-24 NOTE — Telephone Encounter (Signed)
Called patient to inform her that her CA 27.29 is slightly elevated from one month ago.  MD recommends we recheck in one month.  Patient verbalized understanding.

## 2017-02-25 LAB — COPPER, SERUM: Copper: 118 ug/dL (ref 72–166)

## 2017-03-06 ENCOUNTER — Telehealth: Payer: Self-pay | Admitting: *Deleted

## 2017-03-06 NOTE — Telephone Encounter (Signed)
Order faxed.

## 2017-03-06 NOTE — Telephone Encounter (Signed)
Requesting prescription for mastectomy bras t=o be faxed to [facility]. [phone]

## 2017-03-19 ENCOUNTER — Encounter: Payer: Self-pay | Admitting: Internal Medicine

## 2017-03-19 ENCOUNTER — Ambulatory Visit: Payer: Medicare Other | Admitting: Internal Medicine

## 2017-03-19 VITALS — BP 110/68 | HR 103 | Temp 97.9°F | Wt 112.2 lb

## 2017-03-19 DIAGNOSIS — G620 Drug-induced polyneuropathy: Secondary | ICD-10-CM | POA: Diagnosis not present

## 2017-03-19 DIAGNOSIS — D709 Neutropenia, unspecified: Secondary | ICD-10-CM

## 2017-03-19 DIAGNOSIS — Z853 Personal history of malignant neoplasm of breast: Secondary | ICD-10-CM | POA: Diagnosis not present

## 2017-03-19 DIAGNOSIS — E236 Other disorders of pituitary gland: Secondary | ICD-10-CM

## 2017-03-19 DIAGNOSIS — T451X5A Adverse effect of antineoplastic and immunosuppressive drugs, initial encounter: Secondary | ICD-10-CM

## 2017-03-19 DIAGNOSIS — E441 Mild protein-calorie malnutrition: Secondary | ICD-10-CM

## 2017-03-19 NOTE — Assessment & Plan Note (Signed)
Has gained some weight back Discussed protein intake and resistance training

## 2017-03-19 NOTE — Assessment & Plan Note (Signed)
Mastectomies No apparent recurrence

## 2017-03-19 NOTE — Progress Notes (Signed)
Subjective:    Patient ID: Amy Bird, female    DOB: Feb 17, 1950, 67 y.o.   MRN: [id number]  HPI Here to establish care She had seen me in the distant past Some concerns she wasn't getting the right amount of attention  Had breast cancer Bilateral mastectomies RT and chemo due to positive nodes in left axilla (2002) Right removed in 2006 Told BRCA negative--but sister's was positive Lymphedema left arm--wears compression wrap  Colon cancer diagnosed in 2014 Follows with Dr Mike Gip Chemo FOLFOX and surgery --seems clear now  Had considerable weight loss Malabsorption?  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 Had improved Plan to repeat after 5 years Current Outpatient Medications on File Prior to Visit  Medication Sig Dispense Refill  . Calcium Carb-Cholecalciferol (CALCIUM 600 + D PO) Take 2 tablets by mouth daily.    . Cholecalciferol (VITAMIN D3 PO) Take 1,000 mg by mouth daily.     Marland Kitchen latanoprost (XALATAN) 0.005 % ophthalmic solution Place 1 drop into both eyes at bedtime.    . vitamin C (ASCORBIC ACID) 500 MG tablet Take 500 mg by mouth daily.    . Multiple Vitamin (MULTIVITAMIN) capsule Take 1 capsule by mouth daily.     No current facility-administered medications on file prior to visit.     Allergies  Allergen Reactions  . Sulfa Antibiotics Swelling  . Hydrocodone-Ibuprofen Other (See Comments)    Patient feels like there is a heavy wight on her chest  . Levofloxacin Hives  . Statins Other (See Comments)    Leg cramps and groin pain  . Other Itching    Tree Nuts  . Tape Rash    Past Medical History:  Diagnosis Date  . Anemia   . Arthritis   .  Breast cancer, left breast ([place]) 2002   Left Mastectomy, chemo + Rad tx's.  . Colon cancer ([place]) 10/2012   Partial colon resection and chemo tx's.  . Diverticulitis   . Glaucoma   . Hearing loss   . Hyperlipidemia 20 years  . Kidney stone   . Lymphedema of left arm   . Neuromuscular disorder ([place])    neuropathy  feet and hands from chemo tx  . Pneumonia   . Shingles   . Vertigo    last episode around Jan 2018    Past Surgical History:  Procedure Laterality Date  . ABDOMINAL HYSTERECTOMY  1973  . APPENDECTOMY  1969  . CATARACT EXTRACTION W/PHACO Left 07/11/2015   Procedure: CATARACT EXTRACTION PHACO AND INTRAOCULAR LENS PLACEMENT ([place]) left eye;  Surgeon: Leandrew Koyanagi, MD;  Location: [place];  Service: Ophthalmology;  Laterality: Left;  . CATARACT EXTRACTION W/PHACO Right 08/27/2016   Procedure: CATARACT EXTRACTION PHACO AND INTRAOCULAR LENS PLACEMENT ([facility]);  Surgeon: Leandrew Koyanagi, MD;  Location: [place];  Service: Ophthalmology;  Laterality: Right;  IVA TOPICAL RIGHT  . CESAREAN SECTION    . COLON RESECTION    . COLONOSCOPY  2004  . COLONOSCOPY N/A 04/04/2016   Procedure: COLONOSCOPY;  Surgeon: Lollie Sails, MD;  Location: [facility] ENDOSCOPY;  Service: Endoscopy;  Laterality: N/A;  . ESOPHAGOGASTRODUODENOSCOPY N/A 04/04/2016   Procedure: ESOPHAGOGASTRODUODENOSCOPY (EGD);  Surgeon: Lollie Sails, MD;  Location: [facility] ENDOSCOPY;  Service: Endoscopy;  Laterality: N/A;  . kidney stone removal     . MASTECTOMY Bilateral 2002,2010  . salpingo oophorectmy   1973    Family History  Problem Relation Age of Onset  . Hypertension Mother   . Alzheimer's disease Mother   . Cancer Father        prostate  . Prostate cancer Father   . Cancer Sister        breast  . Hypertension Sister   . Cancer Brother        stomach  . Hypertension Brother   . Sarcoidosis Other        positive family history.  . Cancer Sister        breast  . Cancer  Sister        breast    Social History   Socioeconomic History  . Marital status: Divorced    Spouse name: Not on file  . Number of children: 2  . Years of education: Not on file  . Highest education level: Not on file  Social Needs  . Financial resource strain: Not on file  . Food insecurity - worry: Not on file  . Food insecurity - inability: Not on file  . Transportation needs - medical: Not on file  . Transportation needs - non-medical: Not on file  Occupational History  . Occupation: Banker work    Comment: [employer]--part time  Tobacco Use  . Smoking status: Former Smoker    Packs/day: 1.50    Years: 35.00    Pack years: 52.50    Types: Cigarettes    Last attempt to quit: 2002    Years since quitting: 17.1  . Smokeless tobacco: Never Used  Substance and Sexual Activity  . Alcohol use: No  . Drug use: No  . Sexual activity: Not on file  Other Topics Concern  . Not on file  Social History Narrative   Lives alone.   Daughter died   Son lives nearby      No living will   Would want son as health care POA. Alternate would be sister Mardene Celeste   Would accept resuscitation attempts   Not sure about tube feeds   Review of Systems  Constitutional: Positive for unexpected weight change. Negative for fatigue.       Wears seat belt  HENT: Negative for hearing loss and trouble swallowing.        Needs some work on her teeth--from the chemo  Eyes: Negative for visual disturbance.       No diplopia  Cardiovascular: Positive for leg swelling. Negative for chest pain and palpitations.  Gastrointestinal: Negative for abdominal pain, blood in stool and constipation.       No heartburn  Endocrine: Negative for polydipsia and polyuria.  Genitourinary: Negative for dysuria and hematuria.  Musculoskeletal: Negative for arthralgias, back pain and joint swelling.  Skin: Negative for rash.  Allergic/Immunologic: Positive for environmental allergies. Negative for  immunocompromised state.       Doesn't use meds  Neurological: Negative for dizziness, syncope, light-headedness and headaches.  Hematological: Negative for adenopathy. Bruises/bleeds easily.  Psychiatric/Behavioral: Positive for sleep disturbance. Negative for dysphoric mood. The patient is not nervous/anxious.        Objective:   Physical Exam  Constitutional: She is oriented to person, place, and time. She appears well-developed. No distress.  HENT:  Mouth/Throat: Oropharynx is clear and moist. No oropharyngeal exudate.  Neck: No thyromegaly present.  Cardiovascular: Normal rate, regular rhythm, normal heart sounds and intact distal pulses. Exam reveals no gallop.  No murmur heard. Pulmonary/Chest: Effort normal and breath sounds normal. No respiratory distress. She has no wheezes. She has no rales.  Abdominal: Soft. She exhibits no distension. There is no tenderness. There is no rebound and no guarding.  Musculoskeletal: Normal range of motion. She exhibits no edema or tenderness.  Lymphadenopathy:    She has no cervical adenopathy.  Neurological: She is alert and oriented to person, place, and time.  Skin: No rash noted. No erythema.  Psychiatric: She has a normal mood and affect. Her behavior is normal.          Assessment & Plan:

## 2017-03-19 NOTE — Assessment & Plan Note (Signed)
Had been improving Due for repeat check and MRI at [facility] in 2 years

## 2017-03-19 NOTE — Assessment & Plan Note (Signed)
Probably since past chemo No infections Sees Dr Mike Gip

## 2017-03-19 NOTE — Assessment & Plan Note (Signed)
Mostly in hands and some in feet Pain is not bad Some functional issues

## 2017-03-20 ENCOUNTER — Telehealth: Payer: Self-pay | Admitting: Internal Medicine

## 2017-03-20 NOTE — Telephone Encounter (Signed)
I added cefuroxime to list of allergies to meds. Pt was seen 03/19/17.

## 2017-03-20 NOTE — Telephone Encounter (Signed)
Copied from [place] [id number]. Topic: Quick Communication - See Telephone Encounter >> Mar 20, 2017  2:09 PM Arletha Grippe wrote: CRM for notification. See Telephone encounter for:   03/20/17. Pt called  to tell us that pt has had a previous reaction to medication - cefuroxine . It caused her to have diarrhea. She was asked to call in with this medication info Cb if any questions is [phone]

## 2017-03-24 ENCOUNTER — Other Ambulatory Visit: Payer: Medicare Other

## 2017-03-26 ENCOUNTER — Inpatient Hospital Stay: Payer: Medicare Other | Attending: Hematology and Oncology

## 2017-03-26 DIAGNOSIS — C182 Malignant neoplasm of ascending colon: Secondary | ICD-10-CM

## 2017-03-26 DIAGNOSIS — D61818 Other pancytopenia: Secondary | ICD-10-CM | POA: Insufficient documentation

## 2017-03-26 DIAGNOSIS — Z85038 Personal history of other malignant neoplasm of large intestine: Secondary | ICD-10-CM | POA: Insufficient documentation

## 2017-03-27 LAB — CA 27.29 (SERIAL MONITOR): CA 27.29: 44.6 U/mL — ABNORMAL HIGH (ref 0.0–38.6)

## 2017-04-10 ENCOUNTER — Other Ambulatory Visit: Payer: Self-pay | Admitting: Urgent Care

## 2017-04-10 ENCOUNTER — Telehealth: Payer: Self-pay | Admitting: *Deleted

## 2017-04-10 DIAGNOSIS — Z853 Personal history of malignant neoplasm of breast: Secondary | ICD-10-CM

## 2017-05-11 ENCOUNTER — Telehealth: Payer: Self-pay | Admitting: Internal Medicine

## 2017-05-11 ENCOUNTER — Inpatient Hospital Stay: Payer: Medicare Other | Attending: Hematology and Oncology

## 2017-05-11 DIAGNOSIS — Z1211 Encounter for screening for malignant neoplasm of colon: Secondary | ICD-10-CM

## 2017-05-11 NOTE — Telephone Encounter (Signed)
Please let her know that I sent in the referral and she should hear about this soon

## 2017-05-11 NOTE — Telephone Encounter (Signed)
Copied from [place] [id number]. Topic: Referral - Request >> May 11, 2017  1:38 PM Amy Bird wrote: Reason for CRM: Patient was seeing Dr. Silverio Decamp with [facility] for colon cancer/colonoscopy. Patient does not care for the new doctor she was assigned. The cancer center referred her to [facility] and that is too far. Pt is requesting referral to Dr. [name] ph # [phone] [facility]   [address] [place], [place] [address]

## 2017-05-12 NOTE — Telephone Encounter (Signed)
Spoke to pt

## 2017-05-22 ENCOUNTER — Telehealth: Payer: Self-pay | Admitting: *Deleted

## 2017-05-22 ENCOUNTER — Other Ambulatory Visit: Payer: Self-pay

## 2017-05-22 ENCOUNTER — Inpatient Hospital Stay: Payer: Medicare Other | Attending: Hematology and Oncology

## 2017-05-22 ENCOUNTER — Inpatient Hospital Stay: Payer: Medicare Other | Admitting: Hematology and Oncology

## 2017-05-22 DIAGNOSIS — Z853 Personal history of malignant neoplasm of breast: Secondary | ICD-10-CM | POA: Insufficient documentation

## 2017-05-22 DIAGNOSIS — Z85038 Personal history of other malignant neoplasm of large intestine: Secondary | ICD-10-CM | POA: Insufficient documentation

## 2017-05-22 NOTE — Telephone Encounter (Signed)
  Weight better.  112 pounds last time.  M

## 2017-05-22 NOTE — Telephone Encounter (Signed)
Patient came to clinic today for weight check only.  Weight today 113.7.

## 2017-05-23 LAB — CANCER ANTIGEN 27.29: CAN 27.29: 37.7 U/mL (ref 0.0–38.6)

## 2017-06-08 ENCOUNTER — Other Ambulatory Visit: Payer: Self-pay

## 2017-06-08 ENCOUNTER — Ambulatory Visit (INDEPENDENT_AMBULATORY_CARE_PROVIDER_SITE_OTHER): Payer: Medicare Other | Admitting: Gastroenterology

## 2017-06-08 ENCOUNTER — Encounter: Payer: Self-pay | Admitting: Gastroenterology

## 2017-06-08 VITALS — BP 99/57 | HR 85 | Ht 62.0 in | Wt 113.0 lb

## 2017-06-08 DIAGNOSIS — R634 Abnormal weight loss: Secondary | ICD-10-CM | POA: Diagnosis not present

## 2017-06-08 NOTE — Progress Notes (Signed)
Gastroenterology Consultation  Referring Provider:     Venia Carbon, MD Primary Care Physician:  Venia Carbon, MD Primary Gastroenterologist:  Dr. Allen Norris     Reason for Consultation:     Weight loss and history of colon cancer        HPI:   Amy Bird is a 67 y.o. y/o female referred for consultation & management of weight loss and history of colon cancer by Dr. Silvio Pate, Theophilus Kinds, MD.  This patient comes in today after being seen by Dr. Candace Cruise in the past.  The patient was then seen by Dr. Gustavo Lah for an EGD and colonoscopy and was not happy following up with that physician in his office.  The patient then returned to the cancer center who sent the patient to [facility].  At the upper endoscopy done by Dr. Gustavo Lah there was esophagitis seen and the upper endoscopy was repeated at [facility] and was normal.  The patient had her PPI stopped and the patient was started on Xifaxan for possible bacterial overgrowth.  The patient reported the following up at [facility] was difficult and wanted to be seen locally.  She is now here to transfer her care to me for her GI issues.  It has been reported that the patient had gained a pound recently.  This was reported by her oncologist.  The patient was also having loose bowel movements when seen at [facility] and it was recommended that if the Xifaxan did not help the patient's symptoms then she may need to be treated for bilious diarrhea.  The patient now reports that she is not having any diarrhea except that she has episodes approximately once every 3 weeks where she will have watery stools in the morning and then they will completely resolved.  She cannot associate it with any particular food she eats or drinks.   The patient's main concern today is the weight loss although she states that the weight has stabilized and she is holding steady at 113 but she reports that she feels like she eats double the amount that she did in the past.  The patient also reports that she was  told that she needs a colonoscopy every one to 2 years because she had colon cancer.  Past Medical History:  Diagnosis Date  . Anemia   . Arthritis   . Breast cancer, left breast ([place]) 2002   Left Mastectomy, chemo + Rad tx's.  . Colon cancer ([place]) 10/2012   Partial colon resection and chemo tx's.  . Diverticulitis   . Glaucoma   . Hearing loss   . Hyperlipidemia 20 years  . Kidney stone   . Lymphedema of left arm   . Neuromuscular disorder ([place])    neuropathy  feet and hands from chemo tx  . Pneumonia   . Shingles   . Vertigo    last episode around Jan 2018    Past Surgical History:  Procedure Laterality Date  . ABDOMINAL HYSTERECTOMY  1973  . APPENDECTOMY  1969  . CATARACT EXTRACTION W/PHACO Left 07/11/2015   Procedure: CATARACT EXTRACTION PHACO AND INTRAOCULAR LENS PLACEMENT ([place]) left eye;  Surgeon: Leandrew Koyanagi, MD;  Location: [place];  Service: Ophthalmology;  Laterality: Left;  . CATARACT EXTRACTION W/PHACO Right 08/27/2016   Procedure: CATARACT EXTRACTION PHACO AND INTRAOCULAR LENS PLACEMENT ([facility]);  Surgeon: Leandrew Koyanagi, MD;  Location: [place];  Service: Ophthalmology;  Laterality: Right;  IVA TOPICAL RIGHT  . CESAREAN SECTION    .  COLON RESECTION    . COLONOSCOPY  2004  . COLONOSCOPY N/A 04/04/2016   Procedure: COLONOSCOPY;  Surgeon: Lollie Sails, MD;  Location: [facility] ENDOSCOPY;  Service: Endoscopy;  Laterality: N/A;  . ESOPHAGOGASTRODUODENOSCOPY N/A 04/04/2016   Procedure: ESOPHAGOGASTRODUODENOSCOPY (EGD);  Surgeon: Lollie Sails, MD;  Location: [facility] ENDOSCOPY;  Service: Endoscopy;  Laterality: N/A;  . kidney stone removal     . MASTECTOMY Bilateral 2002,2010  . salpingo oophorectmy   1973    Prior to Admission medications   Medication Sig Start Date End Date Taking? Authorizing Provider  Calcium Ascorbate 500 MG TABS Take by mouth.    [provider]  Calcium Carb-Cholecalciferol (CALCIUM 600 + D PO) Take 2  tablets by mouth daily.    [provider]  Cholecalciferol (VITAMIN D3 PO) Take 1,000 mg by mouth daily.     [provider]  Difluprednate (DUREZOL) 0.05 % EMUL Apply to eye.    [provider]  latanoprost (XALATAN) 0.005 % ophthalmic solution Place 1 drop into both eyes at bedtime.    [provider]  meloxicam (MOBIC) 15 MG tablet Take by mouth. 09/27/15   [provider]  Multiple Vitamin (MULTIVITAMIN) capsule Take 1 capsule by mouth daily.    [provider]  nepafenac (ILEVRO) 0.3 % ophthalmic suspension Administer 0.3 drops to the right eye.    [provider]  omeprazole (PRILOSEC) 40 MG capsule Take by mouth. 08/22/16   [provider]  timolol (BETIMOL) 0.5 % ophthalmic solution Administer 1 drop to the right eye daily at 0600.    [provider]  vitamin C (ASCORBIC ACID) 500 MG tablet Take 500 mg by mouth daily.    [provider]    Family History  Problem Relation Age of Onset  . Hypertension Mother   . Alzheimer's disease Mother   . Cancer Father        prostate  . Prostate cancer Father   . Cancer Sister        breast  . Hypertension Sister   . Cancer Brother        stomach  . Hypertension Brother   . Sarcoidosis Other        positive family history.  . Cancer Sister        breast  . Cancer Sister        breast     Social History   Tobacco Use  . Smoking status: Former Smoker    Packs/day: 1.50    Years: 35.00    Pack years: 52.50    Types: Cigarettes    Last attempt to quit: 2002    Years since quitting: 17.3  . Smokeless tobacco: Never Used  Substance Use Topics  . Alcohol use: No  . Drug use: No    Allergies as of 06/08/2017 - Review Complete 03/19/2017  Allergen Reaction Noted  . Sulfa antibiotics Swelling 04/06/2012  . Cefuroxime Diarrhea 03/20/2017  . Hydrocodone-ibuprofen Other (See Comments) 11/08/2014  . Levofloxacin Hives 09/18/2016  . Statins Other  (See Comments) 08/17/2014  . Other Itching 04/06/2012  . Tape Rash 04/06/2012  . Tree extract Itching 10/21/2016    Review of Systems:    All systems reviewed and negative except where noted in HPI.   Physical Exam:  There were no vitals taken for this visit. No LMP recorded. Patient has had a hysterectomy. Psych:  Alert and cooperative. Normal mood and affect. General:   Alert,  Well-developed, well-nourished,  pleasant and cooperative in NAD Head:  Normocephalic and atraumatic. Eyes:  Sclera clear, no icterus.   Conjunctiva pink. Ears:  Normal auditory acuity. Nose:  No deformity, discharge, or lesions. Mouth:  No deformity or lesions,oropharynx pink & moist. Neck:  Supple; no masses or thyromegaly. Lungs:  Respirations even and unlabored.  Clear throughout to auscultation.   No wheezes, crackles, or rhonchi. No acute distress. Heart:  Regular rate and rhythm; no murmurs, clicks, rubs, or gallops. Abdomen:  Normal bowel sounds.  No bruits.  Soft, non-tender and non-distended without masses, hepatosplenomegaly or hernias noted.  No guarding or rebound tenderness.  Negative Carnett sign.   Rectal:  Deferred.  Msk:  Symmetrical without gross deformities.  Good, equal movement & strength bilaterally. Pulses:  Normal pulses noted. Extremities:  No clubbing or edema.  No cyanosis. Neurologic:  Alert and oriented x3;  grossly normal neurologically. Skin:  Intact without significant lesions or rashes.  No jaundice. Lymph Nodes:  No significant cervical adenopathy. Psych:  Alert and cooperative. Normal mood and affect.  Imaging Studies: No results found.  Assessment and Plan:   Amy Bird is a 67 y.o. y/o female who comes in with a history of weight loss and colon cancer. The patient had a colonoscopy last year and does not need a colonoscopy at this time.  She reports that her diarrhea occurs only once every 3 weeks.  Her main concern is that she has not been able to gain weight.   The patient will have her blood sent off for celiac sprue and she will have her stool checked for fecal fat for possible malabsorption as the cause of her not being able to continue to gain weight.  The patient had her EGD and colonoscopy for the same reasons of weight loss and had an upper endoscopy repeated at [facility] more recently.  The patient has been explained the plan and agrees with it.  [name], MD. Marval Regal   Note: This dictation was prepared with Dragon dictation along with smaller phrase technology. Any transcriptional errors that result from this process are unintentional.

## 2017-06-17 LAB — FECAL FAT, QUALITATIVE
FAT QUAL NEUTRAL STL: NORMAL
FAT QUAL TOTAL STL: NORMAL

## 2017-06-17 LAB — GLIA (IGA/G) + TTG IGA
ANTIGLIADIN ABS, IGA: 12 U (ref 0–19)
Gliadin IgG: 3 units (ref 0–19)

## 2017-06-18 ENCOUNTER — Telehealth: Payer: Self-pay

## 2017-06-18 NOTE — Telephone Encounter (Signed)
Pt notified of lab results

## 2017-06-18 NOTE — Telephone Encounter (Signed)
-----   Message from [name], MD sent at 06/17/2017  9:39 PM EDT ----- Let the patient know that the stool did not show any increased fat suggesting malabsorption and the spruce panel was negative.

## 2017-06-19 ENCOUNTER — Telehealth: Payer: Self-pay | Admitting: Internal Medicine

## 2017-06-19 NOTE — Telephone Encounter (Signed)
Left message on vm per DPR °

## 2017-06-19 NOTE — Telephone Encounter (Signed)
That is plenty---it is actually better to get calcium in food but okay to take the 600mg  calcium with vitamin D twice a day. (the vitamin D is essential--it might not be safe to take calcium alone)

## 2017-06-19 NOTE — Telephone Encounter (Signed)
Copied from [place] [id number]. Topic: Quick Communication - See Telephone Encounter >> Jun 19, 2017 11:33 AM Arletha Grippe wrote: CRM for notification. See Telephone encounter for: 06/19/17.  Pt takes 1200 mg calcium per day. She wants to know if this is a good amount or if she needs to take more / less for her age.  Cb is [phone] leave message if no answer

## 2017-06-30 ENCOUNTER — Other Ambulatory Visit: Payer: Self-pay | Admitting: Hematology and Oncology

## 2017-07-13 ENCOUNTER — Ambulatory Visit: Payer: Self-pay | Admitting: Internal Medicine

## 2017-07-13 ENCOUNTER — Encounter: Payer: Self-pay | Admitting: Internal Medicine

## 2017-07-13 ENCOUNTER — Ambulatory Visit: Payer: Medicare Other | Admitting: Internal Medicine

## 2017-07-13 ENCOUNTER — Telehealth: Payer: Self-pay | Admitting: *Deleted

## 2017-07-13 VITALS — BP 96/62 | HR 100 | Temp 99.2°F | Ht 62.0 in | Wt 109.0 lb

## 2017-07-13 DIAGNOSIS — S4992XA Unspecified injury of left shoulder and upper arm, initial encounter: Secondary | ICD-10-CM | POA: Diagnosis not present

## 2017-07-13 NOTE — Progress Notes (Signed)
Subjective:    Patient ID: Amy Bird, female    DOB: [birth year], 67 y.o.   MRN: [id number]  HPI Here due to concerns about her left arm  Moved a TV stand with all the equipment on it yesterday Didn't feel she injured anything Awoke in bed and noticed something Overall tightening as the day went on ---with lymphedema sleeve on Some tingling in tips of fingers---continues Hand is stiff--but not painful Sleeve is tighter  No chest pain Some pain in back of shoulder blade  No meds  No heat, ice, etc  Current Outpatient Medications on File Prior to Visit  Medication Sig Dispense Refill  . Calcium Carb-Cholecalciferol (CALCIUM 600 + D PO) Take 2 tablets by mouth daily.    Marland Kitchen latanoprost (XALATAN) 0.005 % ophthalmic solution Place 1 drop into both eyes at bedtime.    . Multiple Vitamin (MULTIVITAMIN) capsule Take 1 capsule by mouth daily.    . vitamin C (ASCORBIC ACID) 500 MG tablet Take 500 mg by mouth daily.     No current facility-administered medications on file prior to visit.     Allergies  Allergen Reactions  . Sulfa Antibiotics Swelling  . Cefuroxime Diarrhea  . Hydrocodone-Ibuprofen Other (See Comments)    Patient feels like there is a heavy wight on her chest  . Levofloxacin Hives  . Statins Other (See Comments)    Leg cramps and groin pain  . Other Itching    Tree Nuts  . Tape Rash  . Tree Extract Itching    Past Medical History:  Diagnosis Date  . Anemia   . Arthritis   . Breast cancer, left breast ([place]) 2002   Left Mastectomy, chemo + Rad tx's.  . Colon cancer ([place]) 10/2012   Partial colon resection and chemo tx's.  . Diverticulitis   . Glaucoma   . Hearing loss   . Hyperlipidemia 20 years  . Kidney stone   . Lymphedema of left arm   . Neuromuscular disorder ([place])    neuropathy  feet and hands from chemo tx  . Pneumonia   . Shingles   . Vertigo    last episode around Jan 2018    Past Surgical History:  Procedure Laterality Date  .  ABDOMINAL HYSTERECTOMY  1973  . APPENDECTOMY  1969  . CATARACT EXTRACTION W/PHACO Left 07/11/2015   Procedure: CATARACT EXTRACTION PHACO AND INTRAOCULAR LENS PLACEMENT ([place]) left eye;  Surgeon: Leandrew Koyanagi, MD;  Location: [place];  Service: Ophthalmology;  Laterality: Left;  . CATARACT EXTRACTION W/PHACO Right 08/27/2016   Procedure: CATARACT EXTRACTION PHACO AND INTRAOCULAR LENS PLACEMENT ([facility]);  Surgeon: Leandrew Koyanagi, MD;  Location: [place];  Service: Ophthalmology;  Laterality: Right;  IVA TOPICAL RIGHT  . CESAREAN SECTION    . COLON RESECTION    . COLONOSCOPY  2004  . COLONOSCOPY N/A 04/04/2016   Procedure: COLONOSCOPY;  Surgeon: Lollie Sails, MD;  Location: [facility] ENDOSCOPY;  Service: Endoscopy;  Laterality: N/A;  . ESOPHAGOGASTRODUODENOSCOPY N/A 04/04/2016   Procedure: ESOPHAGOGASTRODUODENOSCOPY (EGD);  Surgeon: Lollie Sails, MD;  Location[facility] ENDOSCOPY;  Service: Endoscopy;  Laterality: N/A;  . kidney stone removal     . MASTECTOMY Bilateral 2002,2010  . salpingo oophorectmy   1973    Family History  Problem Relation Age of Onset  . Hypertension Mother   . Alzheimer's disease Mother   . Cancer Father        prostate  . Prostate cancer Father   .  Cancer Sister        breast  . Hypertension Sister   . Cancer Brother        stomach  . Hypertension Brother   . Sarcoidosis Other        positive family history.  . Cancer Sister        breast  . Cancer Sister        breast    Social History   Socioeconomic History  . Marital status: Divorced    Spouse name: Not on file  . Number of children: 2  . Years of education: Not on file  . Highest education level: Not on file  Occupational History  . Occupation: Banker work    Comment: [employer] time  Social Needs  . Financial resource strain: Not on file  . Food insecurity:    Worry: Not on file    Inability: Not on file  . Transportation needs:    Medical: Not on  file    Non-medical: Not on file  Tobacco Use  . Smoking status: Former Smoker    Packs/day: 1.50    Years: 35.00    Pack years: 52.50    Types: Cigarettes    Last attempt to quit: 2002    Years since quitting: 17.4  . Smokeless tobacco: Never Used  Substance and Sexual Activity  . Alcohol use: No  . Drug use: No  . Sexual activity: Not on file  Lifestyle  . Physical activity:    Days per week: Not on file    Minutes per session: Not on file  . Stress: Not on file  Relationships  . Social connections:    Talks on phone: Not on file    Gets together: Not on file    Attends religious service: Not on file    Active member of club or organization: Not on file    Attends meetings of clubs or organizations: Not on file    Relationship status: Not on file  . Intimate partner violence:    Fear of current or ex partner: Not on file    Emotionally abused: Not on file    Physically abused: Not on file    Forced sexual activity: Not on file  Other Topics Concern  . Not on file  Social History Narrative   Lives alone.   Daughter died   Son lives nearby      No living will   Would want son as health care POA. Alternate would be sister Mardene Celeste   Would accept resuscitation attempts   Not sure about tube feeds   Review of Systems Appetite is fair Weight down slightly    Objective:   Physical Exam  Constitutional: She appears well-developed.  Musculoskeletal:  Mild crepitus and pain with rotation of left shoulder No sig swelling in arm/forearm  Neurological:  Left arm is neurovascularly intact Normal strength           Assessment & Plan:

## 2017-07-13 NOTE — Telephone Encounter (Signed)
  Can either see symptom management or PCP today.  M

## 2017-07-13 NOTE — Telephone Encounter (Signed)
Pt  Did lifting  Pulling and may have catrrie some household items  With l arm yesterday Pt has  Some swelling pain in the left hand and numbness to thel hand  Spoke with Rena at the office - Dr Silvio Pate will work patient in today at 445 pm    Reason for Disposition . Numbness (i.e., loss of sensation) in hand or fingers  Answer Assessment - Initial Assessment Questions 1. ONSET: "When did the pain start?"       This am   With swelling   2. LOCATION: "Where is the pain located?"         Shoulder  Down l  Arm  Down to fingers   3. PAIN: "How bad is the pain?" (Scale 1-10; or mild, moderate, severe)   - MILD (1-3): doesn't interfere with normal activities   - MODERATE (4-7): interferes with normal activities (e.g., work or school) or awakens from sleep   - SEVERE (8-10): excruciating pain, unable to do any normal activities, unable to hold a cup of water         Moderate   4. WORK OR EXERCISE: "Has there been any recent work or exercise that involved this part of the body?"           Recent lifting and using left arm yesterday had equipment  Resting on left arm    5. CAUSE: "What do you think is causing the arm pain?"      Bilateral  Mastectomy  The l  Was radical  17 years  ago  Activity yest may have caused the  Swelling   6. OTHER SYMPTOMS: "Do you have any other symptoms?" (e.g., neck pain, swelling, rash, fever, numbness, weakness)   Swelling starts between shoulder and elbow It is tight slightly swollen l hand the l hand feels stiff  And  feels numb at the fingertips   7. PREGNANCY: "Is there any chance you are pregnant?" "When was your last menstrual period?"     n/a  Protocols used: ARM PAIN-A-AH

## 2017-07-13 NOTE — Assessment & Plan Note (Signed)
Reassured--nothing to suggest compartment syndrome Minor injury only Compression sleeve could be causing some trouble after mild swelling---but okay to use again tomorrow Injury may be more in shoulder but nothing striking

## 2017-07-13 NOTE — Telephone Encounter (Signed)
Patient will contact PCP and if they cannot see her, she will call back to see NP in [facility]

## 2017-07-13 NOTE — Telephone Encounter (Signed)
Patient called and states her left arm is only slightly swollen and having pain from shoulder blade to fingers rated at 5/10. Pain getring worse as the day goes on.  Started this morning. She does admit that she moved some small furniture yesterday. She has not tried anything over the counter for the pain. Please advise

## 2017-08-24 ENCOUNTER — Other Ambulatory Visit: Payer: Medicare Other

## 2017-08-24 ENCOUNTER — Ambulatory Visit: Payer: Medicare Other | Admitting: Hematology and Oncology

## 2017-08-25 ENCOUNTER — Inpatient Hospital Stay: Payer: Medicare Other | Attending: Hematology and Oncology

## 2017-08-25 DIAGNOSIS — D61818 Other pancytopenia: Secondary | ICD-10-CM | POA: Insufficient documentation

## 2017-08-25 DIAGNOSIS — C182 Malignant neoplasm of ascending colon: Secondary | ICD-10-CM

## 2017-08-25 DIAGNOSIS — Z853 Personal history of malignant neoplasm of breast: Secondary | ICD-10-CM | POA: Diagnosis not present

## 2017-08-25 DIAGNOSIS — Z85038 Personal history of other malignant neoplasm of large intestine: Secondary | ICD-10-CM | POA: Diagnosis present

## 2017-08-25 DIAGNOSIS — Z79899 Other long term (current) drug therapy: Secondary | ICD-10-CM | POA: Diagnosis not present

## 2017-08-25 LAB — CBC WITH DIFFERENTIAL/PLATELET
Basophils Absolute: 0 10*3/uL (ref 0–0.1)
Basophils Relative: 1 %
Eosinophils Absolute: 0 10*3/uL (ref 0–0.7)
Eosinophils Relative: 0 %
HCT: 39.7 % (ref 35.0–47.0)
Hemoglobin: 13.4 g/dL (ref 12.0–16.0)
Lymphocytes Relative: 43 %
Lymphs Abs: 1.1 10*3/uL (ref 1.0–3.6)
MCH: 32.5 pg (ref 26.0–34.0)
MCHC: 33.6 g/dL (ref 32.0–36.0)
MCV: 96.7 fL (ref 80.0–100.0)
Monocytes Absolute: 0.2 10*3/uL (ref 0.2–0.9)
Monocytes Relative: 7 %
Neutro Abs: 1.3 10*3/uL — ABNORMAL LOW (ref 1.4–6.5)
Neutrophils Relative %: 49 %
Platelets: 148 10*3/uL — ABNORMAL LOW (ref 150–440)
RBC: 4.11 MIL/uL (ref 3.80–5.20)
RDW: 13.3 % (ref 11.5–14.5)
WBC: 2.6 10*3/uL — ABNORMAL LOW (ref 3.6–11.0)

## 2017-08-25 LAB — COMPREHENSIVE METABOLIC PANEL
ALT: 15 U/L (ref 0–44)
AST: 25 U/L (ref 15–41)
Albumin: 3.7 g/dL (ref 3.5–5.0)
Alkaline Phosphatase: 61 U/L (ref 38–126)
Anion gap: 6 (ref 5–15)
BUN: 16 mg/dL (ref 8–23)
CO2: 30 mmol/L (ref 22–32)
Calcium: 8.8 mg/dL — ABNORMAL LOW (ref 8.9–10.3)
Chloride: 104 mmol/L (ref 98–111)
Creatinine, Ser: 0.77 mg/dL (ref 0.44–1.00)
GFR calc Af Amer: >60 mL/min (ref >60)
GFR calc non Af Amer: >60 mL/min (ref >60)
Glucose, Bld: 95 mg/dL (ref 70–99)
Potassium: 3.7 mmol/L (ref 3.5–5.1)
Sodium: 140 mmol/L (ref 135–145)
Total Bilirubin: 0.5 mg/dL (ref 0.3–1.2)
Total Protein: 7.1 g/dL (ref 6.5–8.1)

## 2017-08-26 LAB — CEA: CEA: 7.3 ng/mL — ABNORMAL HIGH (ref 0.0–4.7)

## 2017-08-26 LAB — CA 27.29 (SERIAL MONITOR): CA 27.29: 47.1 U/mL — ABNORMAL HIGH (ref 0.0–38.6)

## 2017-08-27 ENCOUNTER — Inpatient Hospital Stay (HOSPITAL_BASED_OUTPATIENT_CLINIC_OR_DEPARTMENT_OTHER): Payer: Medicare Other | Admitting: Hematology and Oncology

## 2017-08-27 ENCOUNTER — Inpatient Hospital Stay: Payer: Medicare Other

## 2017-08-27 ENCOUNTER — Encounter: Payer: Self-pay | Admitting: Hematology and Oncology

## 2017-08-27 VITALS — BP 99/61 | HR 86 | Temp 98.2°F | Resp 18 | Wt 108.5 lb

## 2017-08-27 DIAGNOSIS — Z853 Personal history of malignant neoplasm of breast: Secondary | ICD-10-CM

## 2017-08-27 DIAGNOSIS — D72819 Decreased white blood cell count, unspecified: Secondary | ICD-10-CM | POA: Diagnosis not present

## 2017-08-27 DIAGNOSIS — R978 Other abnormal tumor markers: Secondary | ICD-10-CM

## 2017-08-27 DIAGNOSIS — Z85038 Personal history of other malignant neoplasm of large intestine: Secondary | ICD-10-CM

## 2017-08-27 DIAGNOSIS — R634 Abnormal weight loss: Secondary | ICD-10-CM

## 2017-08-27 DIAGNOSIS — C182 Malignant neoplasm of ascending colon: Secondary | ICD-10-CM

## 2017-08-27 DIAGNOSIS — D696 Thrombocytopenia, unspecified: Secondary | ICD-10-CM

## 2017-08-27 DIAGNOSIS — R7989 Other specified abnormal findings of blood chemistry: Secondary | ICD-10-CM

## 2017-08-27 DIAGNOSIS — E538 Deficiency of other specified B group vitamins: Secondary | ICD-10-CM

## 2017-08-27 LAB — TSH: TSH: 0.667 u[IU]/mL (ref 0.350–4.500)

## 2017-08-27 LAB — T4, FREE: Free T4: 0.85 ng/dL (ref 0.82–1.77)

## 2017-08-27 LAB — FOLATE: Folate: 12 ng/mL (ref >5.9)

## 2017-08-27 LAB — VITAMIN B12: VITAMIN B 12: 251 pg/mL (ref 180–914)

## 2017-08-27 NOTE — Progress Notes (Signed)
[facility] [facility] day:  08/27/2017  Chief Complaint: Amy Bird is a 67 y.o. female with a history of left breast cancer (2002), right sided colon cancer (2014), mild pancytopenia, and unexplained weight loss who is seen for 6 month assessment.  HPI:  The patient was last seen in the medical oncology clinic on 02/23/2017.  At that time, she was doing well. She denied any abdominal pain or diarrhea.  Her weight was stable.  Exam was unremarkable. Copper was 118.  B12 was 242 (low).  CA27.29 was 42.6 on 02/23/2017, 44.6 on 03/26/2017, 37.7 on 05/22/2017, and 47.1 on 08/25/2017.  CEA was 7.6 on 02/23/2017 and 7.3 on 08/25/2017.  During the interim, patient has been fatigued. She has a "little round ball with little beads in it" on her LEFT inguinal area that "sticks out" when she is in a standing position. She has varying bowel habits. No nausea or vomiting. Patient denies bleeding; no hematochezia, melena, or gross hematuria. She has no breast concerns. Patient has stable neuropathy in her hands that does not limit day to day to function.   Patient advises that she maintains an adequate appetite. She is eating "ok".  Weight today is 108 lb 8 oz (49.2 kg), which compared to her last visit to the clinic, represents a 1 pound weight loss.   Patient denies pain in the clinic today.   Past Medical History:  Diagnosis Date  . Anemia   . Arthritis   . Breast cancer, left breast ([place]) 2002   Left Mastectomy, chemo + Rad tx's.  . Colon cancer ([place]) 10/2012   Partial colon resection and chemo tx's.  . Diverticulitis   . Glaucoma   . Hearing loss   . Hyperlipidemia 20 years  . Kidney stone   . Lymphedema of left arm   . Neuromuscular disorder ([place])    neuropathy  feet and hands from chemo tx  . Pneumonia   . Shingles   . Vertigo    last episode around Jan 2018    Past Surgical History:  Procedure Laterality Date  . ABDOMINAL HYSTERECTOMY  1973  .  APPENDECTOMY  1969  . CATARACT EXTRACTION W/PHACO Left 07/11/2015   Procedure: CATARACT EXTRACTION PHACO AND INTRAOCULAR LENS PLACEMENT ([place]) left eye;  Surgeon: Leandrew Koyanagi, MD;  Location: [facility];  Service: Ophthalmology;  Laterality: Left;  . CATARACT EXTRACTION W/PHACO Right 08/27/2016   Procedure: CATARACT EXTRACTION PHACO AND INTRAOCULAR LENS PLACEMENT (I[facility]);  Surgeon: Leandrew Koyanagi, MD;  Location: [place];  Service: Ophthalmology;  Laterality: Right;  IVA TOPICAL RIGHT  . CESAREAN SECTION    . COLON RESECTION    . COLONOSCOPY  2004  . COLONOSCOPY N/A 04/04/2016   Procedure: COLONOSCOPY;  Surgeon: Lollie Sails, MD;  Location[facility] ENDOSCOPY;  Service: Endoscopy;  Laterality: N/A;  . ESOPHAGOGASTRODUODENOSCOPY N/A 04/04/2016   Procedure: ESOPHAGOGASTRODUODENOSCOPY (EGD);  Surgeon: Lollie Sails, MD;  Location: [facility] ENDOSCOPY;  Service: Endoscopy;  Laterality: N/A;  . kidney stone removal     . MASTECTOMY Bilateral 2002,2010  . salpingo oophorectmy   1973    Family History  Problem Relation Age of Onset  . Hypertension Mother   . Alzheimer's disease Mother   . Cancer Father        prostate  . Prostate cancer Father   . Cancer Sister        breast  . Hypertension Sister   . Cancer Brother  stomach  . Hypertension Brother   . Sarcoidosis Other        positive family history.  . Cancer Sister        breast  . Cancer Sister        breast    Social History:  reports that she quit smoking about 17 years ago. Her smoking use included cigarettes. She has a 52.50 pack-year smoking history. She has never used smokeless tobacco. She reports that she does not drink alcohol or use drugs.  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  She quit smoking in 2003.  She lives in [place].  She belongs to the [employer].  The patient is alone today.  Allergies:  Allergies  Allergen Reactions  . Sulfa Antibiotics Swelling  .  Cefuroxime Diarrhea  . Hydrocodone-Ibuprofen Other (See Comments)    Patient feels like there is a heavy wight on her chest  . Levofloxacin Hives  . Statins Other (See Comments)    Leg cramps and groin pain  . Other Itching    Tree Nuts  . Tape Rash  . Tree Extract Itching    Current Medications: Current Outpatient Medications  Medication Sig Dispense Refill  . Calcium Carb-Cholecalciferol (CALCIUM 600 + D PO) Take 2 tablets by mouth daily.    Marland Kitchen latanoprost (XALATAN) 0.005 % ophthalmic solution Place 1 drop into both eyes at bedtime.    . Multiple Vitamin (MULTIVITAMIN) capsule Take 1 capsule by mouth daily.    . vitamin C (ASCORBIC ACID) 500 MG tablet Take 500 mg by mouth daily.     No current facility-administered medications for this visit.     Review of Systems:  GENERAL:  Tired "sometimes".  No fevers, sweats.  Weight down 5 pounds since 02/23/2017. PERFORMANCE STATUS (ECOG):  1 HEENT:  No visual changes, runny nose, sore throat, mouth sores or tenderness. Lungs: No shortness of breath or cough.  No hemoptysis. Cardiac:  No chest pain, palpitations, orthopnea, or PND. GI:  Variable bowels.  No nausea, vomiting, melena or hematochezia. GU:  No urgency, frequency, dysuria, or hematuria. Musculoskeletal:  No back pain.  No joint pain.  No muscle tenderness. Extremities:  No pain or swelling. Skin:  No rashes or skin changes. Neuro:  Stable neuropathy in hands.  No headache, numbness or weakness, balance or coordination issues. Endocrine:  No diabetes, thyroid issues, hot flashes or night sweats. Psych:  No mood changes, depression or anxiety. Pain:  No focal pain. Review of systems:  All other systems reviewed and found to be negative.   Physical Exam:  Blood pressure 99/61, pulse 86, temperature 98.2 F (36.8 C), temperature source Tympanic, resp. rate 18, weight 108 lb 8 oz (49.2 kg). GENERAL:  Thin woman sitting comfortably in the exam room in no acute  distress. MENTAL STATUS:  Alert and oriented to person, place and time. HEAD:  Brown straight hair.  Normocephalic, atraumatic, face symmetric, no Cushingoid features. EYES:  Glasses.  Brown eyes.  Pupils equal round and reactive to light and accomodation.  No conjunctivitis or scleral icterus. ENT:  Oropharynx clear without lesion.  Tongue normal. Mucous membranes moist.  RESPIRATORY:  Clear to auscultation without rales, wheezes or rhonchi. CARDIOVASCULAR:  Regular rate and rhythm without murmur, rub or gallop. ABDOMEN:  Soft, non-tender, with active bowel sounds, and no hepatosplenomegaly.  No masses. SKIN:  No rashes, ulcers or lesions. EXTREMITIES: Left upper extremity lymphedema sleeve.  No lower extremity edema, no skin discoloration or tenderness.  No palpable cords. LYMPH NODES: Tiny left inguinal node; "knotty".  No palpable cervical, supraclavicular, or axillary adenopathy.  NEUROLOGICAL: Unremarkable. PSYCH:  Appropriate.    Appointment on 08/25/2017  Component Date Value Ref Range Status  . CEA 08/25/2017 7.3* 0.0 - 4.7 ng/mL Final   Comment: (NOTE)                             Nonsmokers          <3.9                             Smokers             <5.6 Roche Diagnostics Electrochemiluminescence Immunoassay (ECLIA) Values obtained with different assay methods or kits cannot be used interchangeably.  Results cannot be interpreted as absolute evidence of the presence or absence of malignant disease. Performed At: [facility] [place], [place] [id number] Rush Farmer MD CW:[id number]   . CA 27.29 08/25/2017 47.1* 0.0 - 38.6 U/mL Final   Comment: (NOTE) Siemens Centaur Immunochemiluminometric Methodology [facility]) Values obtained with different assay methods or kits cannot be used interchangeably. Results cannot be interpreted as absolute evidence of the presence or absence of malignant disease. Performed At: [facility] [place], [place] [id number] Rush Farmer MD GG:[id number] Performed At: [facility] [place], [place] [id number] Nechama Guard MD WE:[id number]   . Sodium 08/25/2017 140  135 - 145 mmol/L Final  . Potassium 08/25/2017 3.7  3.5 - 5.1 mmol/L Final  . Chloride 08/25/2017 104  98 - 111 mmol/L Final  . CO2 08/25/2017 30  22 - 32 mmol/L Final  . Glucose, Bld 08/25/2017 95  70 - 99 mg/dL Final  . BUN 08/25/2017 16  8 - 23 mg/dL Final  . Creatinine, Ser 08/25/2017 0.77  0.44 - 1.00 mg/dL Final  . Calcium 08/25/2017 8.8* 8.9 - 10.3 mg/dL Final  . Total Protein 08/25/2017 7.1  6.5 - 8.1 g/dL Final  . Albumin 08/25/2017 3.7  3.5 - 5.0 g/dL Final  . AST 08/25/2017 25  15 - 41 U/L Final  . ALT 08/25/2017 15  0 - 44 U/L Final  . Alkaline Phosphatase 08/25/2017 61  38 - 126 U/L Final  . Total Bilirubin 08/25/2017 0.5  0.3 - 1.2 mg/dL Final  . GFR calc non Af Amer 08/25/2017 >60  >60 mL/min Final  . GFR calc Af Amer 08/25/2017 >60  >60 mL/min Final   Comment: (NOTE) The eGFR has been calculated using the CKD EPI equation. This calculation has not been validated in all clinical situations. eGFR's persistently <60 mL/min signify possible Chronic Kidney Disease.   Georgiann Hahn gap 08/25/2017 6  5 - 15 Final   Performed at [facility], [address] [place], [place] [address]  . WBC 08/25/2017 2.6* 3.6 - 11.0 K/uL Final  . RBC 08/25/2017 4.11  3.80 - 5.20 MIL/uL Final  . Hemoglobin 08/25/2017 13.4  12.0 - 16.0 g/dL Final  . HCT 08/25/2017 39.7  35.0 - 47.0 % Final  . MCV 08/25/2017 96.7  80.0 - 100.0 fL Final  . MCH 08/25/2017 32.5  26.0 - 34.0 pg Final  . MCHC 08/25/2017 33.6  32.0 - 36.0 g/dL Final  . RDW 08/25/2017 13.3  11.5 - 14.5 % Final  . Platelets 08/25/2017 148* 150 - 440 K/uL Final  .  Neutrophils Relative % 08/25/2017 49  % Final  . Neutro Abs 08/25/2017 1.3* 1.4 - 6.5 K/uL Final  . Lymphocytes Relative 08/25/2017 43  % Final  . Lymphs Abs 08/25/2017  1.1  1.0 - 3.6 K/uL Final  . Monocytes Relative 08/25/2017 7  % Final  . Monocytes Absolute 08/25/2017 0.2  0.2 - 0.9 K/uL Final  . Eosinophils Relative 08/25/2017 0  % Final  . Eosinophils Absolute 08/25/2017 0.0  0 - 0.7 K/uL Final  . Basophils Relative 08/25/2017 1  % Final  . Basophils Absolute 08/25/2017 0.0  0 - 0.1 K/uL Final   Performed at [facility], [address] [place], [place] [address]    Assessment:  Amy Bird is a 67 y.o. female with a history of left breast cancer (2002), right sided stage III colon cancer (2014), and mild pancytopenia.  She was diagnosed with left breast cancer in 2002.  She underwent left mastectomy in 03/2000 (no pathology available).  She received Adriamycin and Cytoxan [facility]) which completed in 07/2000 followed by Herceptin and Taxotere in 08/2000.  She did not receive hormonal therapy.  She underwent right sided mastectomy in 02/2008.  CA27.29 was 40.9 on 06/06/2010, 42.6 on 02/04/2011, 35.3 on 03/31/2012, 38.1 on 11/09/2012, 52.3 on 09/17/2015, 50.5 on 10/01/2015, 47.8 on 02/11/2016, 36.1 on 06/20/2016, 36.6 on 10/20/2016, 42.6 on 02/23/2017, 44.6 on 03/26/2017, 37.7 on 05/22/2017, and 47.1 on 08/25/2017.  She has intermittent anemia, neutropenia, and thrombocytopenia since 10/2012.  Bone marrow aspiration and biopsy in 10/2012 was negative for any myelodysplastic syndrome.  Bone marrow revealed no iron storage.  CBC on 09/17/2015 revealed a hematocrit 39.6, hemoglobin 13.3, MCV 96, platelets 128,000, white count 3000 with ANC of 1700. B12 was 266 (low normal) with a normal MMA on 09/19/2015.  Normal studies included:  folate, TSH, free T4.  Ferritin was 20.    She was diagnosed with colon cancer in 12/2012.  She underwent right hemicolectomy on 12/24/2012.  Pathology revealed a 3.7 cm grade II adenocarcinoma which extended through the muscularis propria and into the adjacent adipose.  One of 27 lymph nodes were positive.  Pathologic stage  III ([id number]).    She was treated on CALGB protocol with FOLFOX chemotherapy +/- celecoxib.  She withdrew from the study in 02/25/2013.  She described trouble with counts.  She received 6 cycles of FOLFOX (01/28/2013 - 04/29/2013).  Colonoscopy on 04/04/2016 revealed a 2 mm polyp in the distal sigmoid colon.  Pathology revealed a serrated polyp.  EGD on 04/04/2016 revealed LA grade A esophagitis, non-bleeding erosive gastropathy, gastritis, and duodenitis.  Gastric and GEJ pathology revealed moderate chronic active inflammation, H pylori associated.  CEA was 3.8 on 12/09/2012, 6.3 on 01/27/2013, 10.6 on 05/26/2013, 5.9 on 09/22/2013, 7.1 on 05/10/2014, 7.0 on 11/08/2014, 5.8 on 05/08/2015, 7.2 on 09/17/2015, 5.9 on 10/01/2015, 6.5 on 02/11/2016, 6.9 on 10/20/2016, 7.6 on 02/23/2017, and 7.3 on 08/25/2017.  PET scan on 10/09/2015 revealed no evidence of recurrent disease.  Chest, abdomen, and pelvic CT scan on 02/15/2016 revealed no findings of recurrent malignancy.  There were radiation therapy related opacities in the left lung.  There was prominent stool throughout the colon.  There was lumbar degenerative disc disease.  She notes gradual weight loss since discontinuation of an anti-depressant years ago.  She previously weighed 110 pounds. TSH and free T4 were normal on 02/11/2016.  Labs on 02/26/2016 revealed a normal cortisol, sed rate, and ANA.  HIV testing was negative.  Left  lower extremity duplex on 09/19/2015 revealed no evidence of DVT.  She had a left popliteal fossa Baker's cyst.  She has been diagnosed with H pylori (stool antigen + 05/17/2016) and treated x 3.  Symptomatically, she feels tired at times.  Weight is down 5 pounds.  Exam reveals small left inguinal adenopathy.  Plan: 1. Review labs from 08/25/2017.  2. Labs today: B12, folate, TSH, free T4.   3. Breast and colon cancer:  Discuss persistent elevation of tumor markers.    Schedule PET scan. 4. Leukopenia:  ongoing  Vitamin B12 level low 6 months ago. Last folate check 09/17/2015.  Check B12 and folate today.  Begin oral B12 1000 mcg a day.  Recheck level in 1 month.  If level remains low, begin B12 injections. 5. Lymphedema: stable  No increase in symptoms. Using sleeve as prescribed.   Continue to monitor.  6. Protein calorie malnutrition: progressive  Patient continues to lose weight; down 5 pounds since last visit. Her weight today is 108 lb 8 oz (49.2 kg). Her BMI of 19.84 kg/m, placing her in a normal weight category. Encouraged her to increase her intake of calorie and protein dense food choices. Additionally, patient was encouraged to utilize nutritional supplement shakes at least 2-3 times a day. Patient to meet with cancer center RD today in clinic.  7. RTC after PET scan for MD assessment and review of workup.   Honor Loh, NP  08/27/2017, 10:47 AM   I saw and evaluated the patient, participating in the key portions of the service and reviewing pertinent diagnostic studies and records.  I reviewed the nurse practitioner's note and agree with the findings and the plan.  The assessment and plan were discussed with the patient.  Several questions were asked by the patient and answered.   Nolon Stalls, MD 08/27/2017, 10:47 AM

## 2017-08-27 NOTE — Progress Notes (Signed)
Patient states her appetite is good.  She is eating well.  States she has an area on her left upper thigh that she would like MD to look at today.

## 2017-08-27 NOTE — Progress Notes (Signed)
Nutrition Follow-up:  Patient seen at request of Gaspar Bidding, NP for continued weight loss.    Patient seen by this RD in March 2018 for weight loss.  Patient was a no show for follow-up appointment in April 2018.  Patient reports her appetite is good and she feels like she is eating double what she used to eat.   Breakfast is 2 egg whites, sometimes bacon but not always, plain toast or sometimes just 1/2 pack of instant oatmeal mixed with water and water to drink.  Lunch is usually around 2pm and she may eat 1 chicken leg with chips or [place] burger with little mayo, chips and fruit.  Dinner is sometimes few bites of [place] wing (not the whole wing), lima beans or grits.  Likes something sweet sometimes.  Snacks on lowfat greek yogurt, tortilla chips no dip.  Does not drink oral nutrition supplements as feels she is intolerant to diary foods.  Has not tried lactose free milk.  Concerned about cholesterol and sodium.  Tries to limit these in her diet as much as possible.     Medications: reviewed, reports not taking MVI as causes her to itch, Vit C 529ml daily, Calcium and Vit D  Labs: reviewed  Anthropometrics:   Weight decreased to 108 lb 8 oz today from 114 lb on March 2018 114 lb  Highest weight in the last year 113 lb.  UBW 137 lb in August 2017  Nutrition-Focused physical exam completed. Findings are mild orbital, mild buccal, mild triceps, mild/moderate ribs fat depletion, mild temple, moderate clavicle, unable to view shoulder or back, mild hand, unable to view lower extremities wearing pants muscle depletion, and unable to view edema.    NUTRITION DIAGNOSIS: Unintentional weight loss continues   INTERVENTION:  Recommend patient liberalize diet. Discussed ways to increase calories to current eating pattern.  Handout given on strategies Provided patient with sample meal plan and calorie goal per day.  Patient surprised at the amount of food needed to meet calorie goal per day ("that's  a lot of food").   Provided samples of shakes that are lactose and dairy free for patient to try today [employer], orgain and ensure clear)    MONITORING, EVALUATION, GOAL: Patient will consume adequate calories and protein to prevent weight loss   NEXT VISIT: RD out of the office on next follow-up.  Phone f/u  Amy Bird, [place], [place] Registered Dietitian [phone] (pager)

## 2017-08-27 NOTE — Patient Instructions (Signed)
Please start taking B12 1,000 mcg DAILY  Vitamin B12 Deficiency Vitamin B12 deficiency means that your body is not getting enough vitamin B12. Your body needs vitamin B12 for important bodily functions. If you do not have enough vitamin B12 in your body, you can have health problems. Follow these instructions at home:  Take supplements only as told by your doctor. Follow the directions carefully.  Get any shots (injections) as told by your doctor. Do not miss your visits to the doctor.  Eat lots of healthy foods that contain vitamin B12. Ask your doctor if you should work with someone who is trained in how food affects health (dietitian). Foods that contain vitamin B12 include: ? Meat. ? Meat from birds (poultry). ? Fish. ? Eggs. ? Cereal and dairy products that are fortified. This means that vitamin B12 has been added to the food. Check the label on the package to see if the food is fortified.  Do not drink too much (do not abuse) alcohol.  Keep all follow-up visits as told by your doctor. This is important. Contact a doctor if:  Your symptoms come back. Get help right away if:  You have trouble breathing.  You have chest pain.  You get dizzy.  You pass out (lose consciousness). This information is not intended to replace advice given to you by your health care provider. Make sure you discuss any questions you have with your health care provider. Document Released: 12/26/2010 Document Revised: 06/14/2015 Document Reviewed: 05/24/2014 Elsevier Interactive Patient Education  [employer].

## 2017-08-31 ENCOUNTER — Telehealth: Payer: Self-pay | Admitting: *Deleted

## 2017-08-31 NOTE — Telephone Encounter (Signed)
Per Velna Hatchet 08/31/17 staff message to cancel Pending PET scan scheduled for 09/01/17.  I called patient and made her aware that the 09/01/17 PET scan was cancelled and it will Be R/S once it has been approved by the insurance. Then we will call her once it's approved.

## 2017-09-01 ENCOUNTER — Ambulatory Visit: Payer: Medicare Other

## 2017-09-10 ENCOUNTER — Inpatient Hospital Stay: Payer: Medicare Other | Admitting: Hematology and Oncology

## 2017-09-11 ENCOUNTER — Ambulatory Visit
Admission: RE | Admit: 2017-09-11 | Discharge: 2017-09-11 | Disposition: A | Discharge status: Home / Self Care | Payer: Medicare Other | Source: Ambulatory Visit | Attending: Urgent Care | Admitting: Urgent Care

## 2017-09-11 DIAGNOSIS — C182 Malignant neoplasm of ascending colon: Secondary | ICD-10-CM | POA: Diagnosis present

## 2017-09-11 DIAGNOSIS — Z9013 Acquired absence of bilateral breasts and nipples: Secondary | ICD-10-CM | POA: Diagnosis not present

## 2017-09-11 DIAGNOSIS — R978 Other abnormal tumor markers: Secondary | ICD-10-CM | POA: Diagnosis not present

## 2017-09-11 DIAGNOSIS — R634 Abnormal weight loss: Secondary | ICD-10-CM | POA: Diagnosis not present

## 2017-09-11 DIAGNOSIS — Z853 Personal history of malignant neoplasm of breast: Secondary | ICD-10-CM | POA: Diagnosis not present

## 2017-09-11 DIAGNOSIS — I700 Atherosclerosis of aorta: Secondary | ICD-10-CM | POA: Insufficient documentation

## 2017-09-11 LAB — GLUCOSE, CAPILLARY: Glucose-Capillary: 84 mg/dL (ref 70–99)

## 2017-09-11 MED ORDER — FLUDEOXYGLUCOSE F - 18 (FDG) INJECTION
5.6000 | Freq: Once | INTRAVENOUS | Status: AC | PRN
  Administered 2017-09-11: 5.6 via INTRAVENOUS

## 2017-09-15 ENCOUNTER — Inpatient Hospital Stay (HOSPITAL_BASED_OUTPATIENT_CLINIC_OR_DEPARTMENT_OTHER): Payer: Medicare Other | Admitting: Hematology and Oncology

## 2017-09-15 ENCOUNTER — Encounter: Payer: Self-pay | Admitting: Hematology and Oncology

## 2017-09-15 VITALS — BP 100/64 | HR 88 | Temp 97.4°F | Resp 16 | Wt 110.3 lb

## 2017-09-15 DIAGNOSIS — E538 Deficiency of other specified B group vitamins: Secondary | ICD-10-CM

## 2017-09-15 DIAGNOSIS — Z79899 Other long term (current) drug therapy: Secondary | ICD-10-CM

## 2017-09-15 DIAGNOSIS — Z853 Personal history of malignant neoplasm of breast: Secondary | ICD-10-CM | POA: Diagnosis not present

## 2017-09-15 DIAGNOSIS — R634 Abnormal weight loss: Secondary | ICD-10-CM | POA: Diagnosis not present

## 2017-09-15 DIAGNOSIS — Z85038 Personal history of other malignant neoplasm of large intestine: Secondary | ICD-10-CM

## 2017-09-15 DIAGNOSIS — D61818 Other pancytopenia: Secondary | ICD-10-CM | POA: Diagnosis not present

## 2017-09-15 DIAGNOSIS — C182 Malignant neoplasm of ascending colon: Secondary | ICD-10-CM

## 2017-09-15 NOTE — Progress Notes (Signed)
[facility] day:  09/15/2017  Chief Complaint: Amy Bird is a 67 y.o. female with a history of left breast cancer (2002), right sided colon cancer (2014), mild pancytopenia, and unexplained weight loss who is seen for review of interval PET scan.  HPI:  The patient was last seen in the medical oncology clinic on 08/27/2017.  At that time, she continued to lose weight. She denied any abdominal pain or diarrhea.  Exam was unremarkable. CA27.29 and CEA remained elevated.  B12 was 251.  Folate was 12.  TSH was 0.667 with a free T4 of 0.85.  Oral B12 was started.  PET scan on 09/11/2017 revealed no hypermetabolic residual or recurrent disease.  Symptomatically, she feels better.  She has gained 2 pounds since her last visit.  She is eating "a lot of food" (1700 calories/day).  She describes eating every 2 hours. She notes some mild right lower quadrant pain.  She has a stable neuropathy in her feet.  Oral B12 started on 08/28/2017.   Past Medical History:  Diagnosis Date  . Anemia   . Arthritis   . Breast cancer, left breast ([place]) 2002   Left Mastectomy, chemo + Rad tx's.  . Colon cancer ([place]) 10/2012   Partial colon resection and chemo tx's.  . Diverticulitis   . Glaucoma   . Hearing loss   . Hyperlipidemia 20 years  . Kidney stone   . Lymphedema of left arm   . Neuromuscular disorder ([place])    neuropathy  feet and hands from chemo tx  . Pneumonia   . Shingles   . Vertigo    last episode around Jan 2018    Past Surgical History:  Procedure Laterality Date  . ABDOMINAL HYSTERECTOMY  1973  . APPENDECTOMY  1969  . CATARACT EXTRACTION W/PHACO Left 07/11/2015   Procedure: CATARACT EXTRACTION PHACO AND INTRAOCULAR LENS PLACEMENT ([place]) left eye;  Surgeon: Leandrew Koyanagi, MD;  Location: [facility];  Service: Ophthalmology;  Laterality: Left;  . CATARACT EXTRACTION W/PHACO Right 08/27/2016   Procedure: CATARACT EXTRACTION PHACO  AND INTRAOCULAR LENS PLACEMENT ([facility]);  Surgeon: Leandrew Koyanagi, MD;  Location: [facility];  Service: Ophthalmology;  Laterality: Right;  IVA TOPICAL RIGHT  . CESAREAN SECTION    . COLON RESECTION    . COLONOSCOPY  2004  . COLONOSCOPY N/A 04/04/2016   Procedure: COLONOSCOPY;  Surgeon: Lollie Sails, MD;  Location: [facility] ENDOSCOPY;  Service: Endoscopy;  Laterality: N/A;  . ESOPHAGOGASTRODUODENOSCOPY N/A 04/04/2016   Procedure: ESOPHAGOGASTRODUODENOSCOPY (EGD);  Surgeon: Lollie Sails, MD;  Location: [facility] ENDOSCOPY;  Service: Endoscopy;  Laterality: N/A;  . kidney stone removal     . MASTECTOMY Bilateral 2002,2010  . salpingo oophorectmy   1973    Family History  Problem Relation Age of Onset  . Hypertension Mother   . Alzheimer's disease Mother   . Cancer Father        prostate  . Prostate cancer Father   . Cancer Sister        breast  . Hypertension Sister   . Cancer Brother        stomach  . Hypertension Brother   . Sarcoidosis Other        positive family history.  . Cancer Sister        breast  . Cancer Sister        breast    Social History:  reports that she quit smoking about  17 years ago. Her smoking use included cigarettes. She has a 52.50 pack-year smoking history. She has never used smokeless tobacco. She reports that she does not drink alcohol or use drugs.  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  She quit smoking in 2003.  She lives in [place].  She belongs to the [employer].  The patient is alone today.  Allergies:  Allergies  Allergen Reactions  . Sulfa Antibiotics Swelling  . Cefuroxime Diarrhea  . Hydrocodone-Ibuprofen Other (See Comments)    Patient feels like there is a heavy wight on her chest  . Levofloxacin Hives  . Statins Other (See Comments)    Leg cramps and groin pain  . Other Itching    Tree Nuts  . Tape Rash  . Tree Extract Itching    Current Medications: Current Outpatient Medications  Medication Sig  Dispense Refill  . Calcium Carb-Cholecalciferol (CALCIUM 600 + D PO) Take 2 tablets by mouth daily.    Marland Kitchen latanoprost (XALATAN) 0.005 % ophthalmic solution Place 1 drop into both eyes at bedtime.    . vitamin B-12 (CYANOCOBALAMIN) 1000 MCG tablet Take 1,000 mcg by mouth daily.    . vitamin C (ASCORBIC ACID) 500 MG tablet Take 500 mg by mouth daily.    . Multiple Vitamin (MULTIVITAMIN) capsule Take 1 capsule by mouth daily.     No current facility-administered medications for this visit.     Review of Systems:  GENERAL:  Feels "better".  No fevers, sweats or weight loss.  Weight up 2 pounds. PERFORMANCE STATUS (ECOG):  1 HEENT:  No visual changes, runny nose, sore throat, mouth sores or tenderness. Lungs: No shortness of breath or cough.  No hemoptysis. Cardiac:  No chest pain, palpitations, orthopnea, or PND. GI:  Eating well.  RLQ abdominal discomfort.  No nausea, vomiting, diarrhea, constipation, melena or hematochezia. GU:  No urgency, frequency, dysuria, or hematuria. Musculoskeletal:  No back pain.  No joint pain.  No muscle tenderness. Extremities:  No pain or swelling. Skin:  No rashes or skin changes. Neuro:  Neuropathy in feet (chronic).  No headache, numbness or weakness, balance or coordination issues. Endocrine:  No diabetes, thyroid issues, hot flashes or night sweats. Psych:  No mood changes, depression or anxiety. Pain:  No focal pain. Review of systems:  All other systems reviewed and found to be negative.   Physical Exam:  Blood pressure 100/64, pulse 88, temperature (!) 97.4 F (36.3 C), temperature source Tympanic, resp. rate 16, weight 110 lb 5 oz (50 kg). GENERAL:  Well developed, well nourished, woman sitting comfortably in the exam room in no acute distress. MENTAL STATUS:  Alert and oriented to person, place and time. HEAD:  Shoulder length dark hair.  Normocephalic, atraumatic, face symmetric, no Cushingoid features. EYES:  Glasses.  Brown eyes. No  conjunctivitis or scleral icterus. EXTREMITIES: Left upper extremity lymphedema sleeve.  No lower extremity edema. NEUROLOGICAL: Unremarkable. PSYCH:  Appropriate.    No visits with results within 3 Day(s) from this visit.  Latest known visit with results is:  Hospital Outpatient Visit on 09/11/2017  Component Date Value Ref Range Status  . Glucose-Capillary 09/11/2017 84  70 - 99 mg/dL Final    Assessment:  Amy Bird is a 67 y.o. female with a history of left breast cancer (2002), right sided stage III colon cancer (2014), and mild pancytopenia.  She was diagnosed with left breast cancer in 2002.  She underwent left mastectomy in 03/2000 (no pathology  available).  She received Adriamycin and Cytoxan [facility]) which completed in 07/2000 followed by Herceptin and Taxotere in 08/2000.  She did not receive hormonal therapy.  She underwent right sided mastectomy in 02/2008.  CA27.29 was 40.9 on 06/06/2010, 42.6 on 02/04/2011, 35.3 on 03/31/2012, 38.1 on 11/09/2012, 52.3 on 09/17/2015, 50.5 on 10/01/2015, 47.8 on 02/11/2016, 36.1 on 06/20/2016, 36.6 on 10/20/2016, 42.6 on 02/23/2017, 44.6 on 03/26/2017, 37.7 on 05/22/2017, and 47.1 on 08/25/2017.  She has intermittent anemia, neutropenia, and thrombocytopenia since 10/2012.  Bone marrow aspiration and biopsy in 10/2012 was negative for any myelodysplastic syndrome.  Bone marrow revealed no iron storage.  CBC on 09/17/2015 revealed a hematocrit 39.6, hemoglobin 13.3, MCV 96, platelets 128,000, white count 3000 with ANC of 1700. B12 was 266 (low normal) with a normal MMA on 09/19/2015.  Normal studies included:  folate, TSH, free T4.  Ferritin was 20.    She was diagnosed with colon cancer in 12/2012.  She underwent right hemicolectomy on 12/24/2012.  Pathology revealed a 3.7 cm grade II adenocarcinoma which extended through the muscularis propria and into the adjacent adipose.  One of 27 lymph nodes were positive.  Pathologic stage III (X5Q0GQ6).     She was treated on CALGB protocol with FOLFOX chemotherapy +/- celecoxib.  She withdrew from the study in 02/25/2013.  She described trouble with counts.  She received 6 cycles of FOLFOX (01/28/2013 - 04/29/2013).  Colonoscopy on 04/04/2016 revealed a 2 mm polyp in the distal sigmoid colon.  Pathology revealed a serrated polyp.  EGD on 04/04/2016 revealed LA grade A esophagitis, non-bleeding erosive gastropathy, gastritis, and duodenitis.  Gastric and GEJ pathology revealed moderate chronic active inflammation, H pylori associated.  CEA was 3.8 on 12/09/2012, 6.3 on 01/27/2013, 10.6 on 05/26/2013, 5.9 on 09/22/2013, 7.1 on 05/10/2014, 7.0 on 11/08/2014, 5.8 on 05/08/2015, 7.2 on 09/17/2015, 5.9 on 10/01/2015, 6.5 on 02/11/2016, 6.9 on 10/20/2016, 7.6 on 02/23/2017, and 7.3 on 08/25/2017.  PET scan on 10/09/2015 revealed no evidence of recurrent disease.  Chest, abdomen, and pelvic CT scan on 02/15/2016 revealed no findings of recurrent malignancy.  There were radiation therapy related opacities in the left lung.  There was prominent stool throughout the colon.  There was lumbar degenerative disc disease.  PET scan on 09/11/2017 revealed no hypermetabolic residual or recurrent disease.  She notes gradual weight loss since discontinuation of an anti-depressant years ago.  She previously weighed 110 pounds. TSH and free T4 were normal on 02/11/2016.  Labs on 02/26/2016 revealed a normal cortisol, sed rate, and ANA.  HIV testing was negative.  TSH and free T4 were normal on 08/27/2017.  Left lower extremity duplex on 09/19/2015 revealed no evidence of DVT.  She had a left popliteal fossa Baker's cyst.  She has been diagnosed with H pylori (stool antigen + 05/17/2016) and treated x 3.  She has B12 deficiency.  B12 was 251 on 08/27/2017.  Folate was 12.  She began oral B12 on 08/28/2017.  Symptomatically, she is feeling better.  She has gained 2 pounds.  Exam is stable.  Plan: 1. Discuss labs from  last visit.   2. Breast and colon cancer:  Discuss chronic elevation of tumor markers.    PET scan on 09/11/2017 revealed no metabolically active disease.  Images personally reviewed.  Agree with radiology.  Discuss ongoing monitoring. 3. Leukopenia: ongoing  Vitamin B12 level low.  Oral B12 began 08/28/2017.  Recheck level in 1 month.  If level remains low, begin B12 injections.  4. Lymphedema: stable  Continue using lymphedema sleeve.  5. Protein calorie malnutrition: improved  Thyroid function studies were normal.  Patient met with dietician.  Patient eating 1700 calories/day.  Weight up 2 pounds.  Continue to follow-up with cancer [facility] dietician.  6.   RTC on 09/25/2017 for labs (CBC with diff, B12). 7.   RTC on 12/25/2017 for MD assessment and labs (CBC with diff, CMP, CEA, CA27.29).   Lequita Asal, MD  09/15/2017, 10:12 AM

## 2017-09-15 NOTE — Progress Notes (Signed)
Pt in for follow up and PET scan results. Reports some abdominal discomfort in right lower quadrant of abdomen.

## 2017-09-24 ENCOUNTER — Inpatient Hospital Stay: Payer: Medicare Other | Attending: Hematology and Oncology

## 2017-09-24 ENCOUNTER — Inpatient Hospital Stay: Payer: Medicare Other

## 2017-09-24 DIAGNOSIS — R634 Abnormal weight loss: Secondary | ICD-10-CM | POA: Insufficient documentation

## 2017-09-24 DIAGNOSIS — C182 Malignant neoplasm of ascending colon: Secondary | ICD-10-CM

## 2017-09-24 DIAGNOSIS — Z85038 Personal history of other malignant neoplasm of large intestine: Secondary | ICD-10-CM | POA: Diagnosis present

## 2017-09-24 DIAGNOSIS — Z853 Personal history of malignant neoplasm of breast: Secondary | ICD-10-CM | POA: Insufficient documentation

## 2017-09-24 DIAGNOSIS — D696 Thrombocytopenia, unspecified: Secondary | ICD-10-CM | POA: Insufficient documentation

## 2017-09-24 DIAGNOSIS — E538 Deficiency of other specified B group vitamins: Secondary | ICD-10-CM | POA: Insufficient documentation

## 2017-09-24 DIAGNOSIS — D72819 Decreased white blood cell count, unspecified: Secondary | ICD-10-CM | POA: Insufficient documentation

## 2017-09-24 DIAGNOSIS — R978 Other abnormal tumor markers: Secondary | ICD-10-CM | POA: Insufficient documentation

## 2017-09-24 LAB — CBC WITH DIFFERENTIAL/PLATELET
Basophils Absolute: 0 10*3/uL (ref 0–0.1)
Basophils Relative: 1 %
Eosinophils Absolute: 0 10*3/uL (ref 0–0.7)
Eosinophils Relative: 1 %
HCT: 40.5 % (ref 35.0–47.0)
Hemoglobin: 13.4 g/dL (ref 12.0–16.0)
Lymphocytes Relative: 40 %
Lymphs Abs: 0.9 10*3/uL — ABNORMAL LOW (ref 1.0–3.6)
MCH: 32.4 pg (ref 26.0–34.0)
MCHC: 33 g/dL (ref 32.0–36.0)
MCV: 98.2 fL (ref 80.0–100.0)
Monocytes Absolute: 0.1 10*3/uL — ABNORMAL LOW (ref 0.2–0.9)
Monocytes Relative: 6 %
Neutro Abs: 1.2 10*3/uL — ABNORMAL LOW (ref 1.4–6.5)
Neutrophils Relative %: 52 %
Platelets: 132 10*3/uL — ABNORMAL LOW (ref 150–440)
RBC: 4.12 MIL/uL (ref 3.80–5.20)
RDW: 13.4 % (ref 11.5–14.5)
WBC: 2.3 10*3/uL — ABNORMAL LOW (ref 3.6–11.0)

## 2017-09-24 LAB — VITAMIN B12: Vitamin B-12: 760 pg/mL (ref 180–914)

## 2017-09-24 NOTE — Progress Notes (Signed)
Nutrition Follow-up:  Patient with history of breast cancer 2002 and right colon cancer (2014). Noted per MD following recent PET (09/11/2017) revealed no hypermetabolic residual or recurrent disease.    Met with patient today in clinic prior to lab work.  Patient reports that she has really been trying to increase calories by adding gravies, sauces, condiments, etc.  Reports that she has been eating more at breakfast for example would just eat plain toast or 1/2 pack oatmeal for breakfast now eating 2 pieces of bread with egg and cheese. Reports around 12:30 usually eats 1/2 sandwich (pbutter and jelly), then around 2:30 will eat again (1/2 meat sandwich) before leaving work at [place] is usually around 5pm and reports eating last night [place] with gravy and cooked kale with onions with added oil while sauteing.  Reports prepared and ate recently a chicken pie with [employer] biscuits on top for crust.  Reports has recently got and ate Blue Bunny ice cream with chocolate syrup, greek yogurt ice cream bars. Ate waffle with syrup after dinner for bedtime snack.  Reports, "I have never eaten this much food." Also reports recently getting dove chocolates  Reports that she tried the [facility] and ensure clear shakes and really like ensure clear.  Not sure if Dillard Essex gave her a few pimples on her face or just all the "Other" foods she has been eating.    Reports regular bowel movement (typically daily or every other day)  Reports no issues with nausea, chewing or swallowing problems.   Medications: reviewed  Labs: reviewed  Anthropometrics:   Weight increased to 110 lb 7 oz today, increased from 108 lb 8 oz on 8/8 "I thought I would have gained more weight because of the way I have been eating."    NUTRITION DIAGNOSIS: Unintentional weight loss improving   INTERVENTION:  Recommend patient continue to liberalize diet to increase calories and protein.   Offered encouragement to continue to  meet calorie and protein goals daily to continue to gain weight.   Reviewed strategies with patient to help her meet her goals.   Patient was agreeable to trying more Dillard Essex products for additional calories and protein.  Recommend patient take 262m Vit C BID for better absorption.     MONITORING, EVALUATION, GOAL: Patient will consume adequate calories and protein to prevent weight loss   NEXT VISIT: October 3rd for weight check  Emilliano Dilworth B. [place]enia Resides [place]aver[place] City[place]istered Dietitian [phone](pager)

## 2017-09-25 ENCOUNTER — Other Ambulatory Visit: Payer: Medicare Other

## 2017-10-22 ENCOUNTER — Inpatient Hospital Stay: Payer: Medicare Other | Attending: Hematology and Oncology

## 2017-10-22 NOTE — Progress Notes (Addendum)
Nutrition Follow-up:  Patient with history of breast cancer 2002 and right colon cancer (2014).  Noted per MD following recent PET (09/11/17) revealed no hypermetabolic residual or recurrent disease.    Met with patient today in clinic.  Patient reports that she has been continuing to eat more food and add calories.  Reports that she has not been eating as much lately due to sinus congestion due to change in weather.  Reports last night had a big serving of spaghetti, sometimes will get [employer] 2 piece chicken with bisuit and either fries or macaroni and cheese for dinner.  Lunch is usually a chicken sandwich with chips or maybe a dessert. Only uses 1 piece of bread with sandwich.  Breakfast is sometimes, not often bacon and egg (no yolk) whites (2). Breakfast is usually peanut butter crackers.    Patient does not like [employer] or ensure clear.  Reports that she is able to drink ensure or boost but does not drink them on regular basis.  No nausea, constipation,or other nutrition impact symptoms reported.   Medications: reviewed  Labs: no new  Anthropometrics:   Weight increased to 111 lb 5 oz today from 110 lb 7 oz on 9/5.     NUTRITION DIAGNOSIS: Unintentional weight loss improving   INTERVENTION:  Recommend adding 350 calorie shake (ensure or boost) at breakfast daily to provide additional calories and protein and continue weight gain. Patient reports that she feels like she could do that but was shocked.  Explanation provided as to why. Also discussed other foods that provide similar calories.   Patient given 1st case of ensure enlive today.     MONITORING, EVALUATION, GOAL: Patient will consume adequate calories and protein to prevent weight loss   NEXT VISIT:  November 14 at 2:30 for follow up and weight check  Marvelene Stoneberg B. [name], [place], [place] Registered Dietitian [phone] (pager)

## 2017-11-15 DIAGNOSIS — R978 Other abnormal tumor markers: Secondary | ICD-10-CM | POA: Insufficient documentation

## 2017-12-03 ENCOUNTER — Inpatient Hospital Stay: Payer: Medicare Other | Attending: Hematology and Oncology

## 2017-12-03 NOTE — Progress Notes (Signed)
Nutrition Follow-up:  Patient with history of breast cancer 2002 and right colon cancer (2014).  Noted per MD following recent PET (09/11/17) revealed no hypermetabolic residual or recurrent disease.  Met with patient in clinic today.  Patient reports that she has been eating better.  Still has times when she does not want to eat much.  Not sure what triggers decreased appetite except for being tired and not wanting to fix something to eat.  Reports has really been trying to eat toast with jelly or peanut butter crackers before going to work then 2 hours after getting to work drinking ensure [employer].  Eats lunch of sandwich with sometimes tortilla chips, fruit cup, candy.  Supper last night was [place] with sweet potatoes fries.     Medications: reviewed  Labs: no new labs  Anthropometrics:   Weight 113 lb today measured in clinic increased from 111 lb 5 oz on 10/3.    NUTRITION DIAGNOSIS:  Unintentional weight loss  INTERVENTION:  Congratulated patient on job well done!! Talked about breakfast options before going to work.  Patient reports this is an area that she can improve on.   Encouraged at least to continue to drink 1 ensure enlive daily.  2nd case of ensure enlive given to patient today Discussed how to put a meal together and what counts as a "meal" with increased calories and protein.  Patient very concerned about choosing "healthy" foods.      MONITORING, EVALUATION, GOAL: Patient will consume adequate calories and protein to prevent weight loss   NEXT VISIT: Thursday, Dec 12  Ondra Deboard B. [name], [place], [place] Registered Dietitian [phone] (pager)

## 2017-12-25 ENCOUNTER — Other Ambulatory Visit: Payer: Medicare Other

## 2017-12-25 ENCOUNTER — Ambulatory Visit: Payer: Medicare Other | Admitting: Hematology and Oncology

## 2017-12-28 ENCOUNTER — Inpatient Hospital Stay: Payer: Medicare Other | Attending: Hematology and Oncology

## 2017-12-28 ENCOUNTER — Inpatient Hospital Stay (HOSPITAL_BASED_OUTPATIENT_CLINIC_OR_DEPARTMENT_OTHER): Payer: Medicare Other | Admitting: Hematology and Oncology

## 2017-12-28 ENCOUNTER — Other Ambulatory Visit: Payer: Self-pay

## 2017-12-28 ENCOUNTER — Encounter: Payer: Self-pay | Admitting: Hematology and Oncology

## 2017-12-28 VITALS — BP 102/67 | HR 98 | Temp 96.9°F | Resp 18 | Wt 111.1 lb

## 2017-12-28 DIAGNOSIS — Z87891 Personal history of nicotine dependence: Secondary | ICD-10-CM

## 2017-12-28 DIAGNOSIS — I890 Lymphedema, not elsewhere classified: Secondary | ICD-10-CM

## 2017-12-28 DIAGNOSIS — Z90710 Acquired absence of both cervix and uterus: Secondary | ICD-10-CM | POA: Insufficient documentation

## 2017-12-28 DIAGNOSIS — Z9079 Acquired absence of other genital organ(s): Secondary | ICD-10-CM | POA: Insufficient documentation

## 2017-12-28 DIAGNOSIS — D61818 Other pancytopenia: Secondary | ICD-10-CM | POA: Insufficient documentation

## 2017-12-28 DIAGNOSIS — Z85038 Personal history of other malignant neoplasm of large intestine: Secondary | ICD-10-CM

## 2017-12-28 DIAGNOSIS — Z9013 Acquired absence of bilateral breasts and nipples: Secondary | ICD-10-CM

## 2017-12-28 DIAGNOSIS — Z79899 Other long term (current) drug therapy: Secondary | ICD-10-CM

## 2017-12-28 DIAGNOSIS — Z853 Personal history of malignant neoplasm of breast: Secondary | ICD-10-CM | POA: Diagnosis present

## 2017-12-28 DIAGNOSIS — Z8042 Family history of malignant neoplasm of prostate: Secondary | ICD-10-CM | POA: Diagnosis not present

## 2017-12-28 DIAGNOSIS — C182 Malignant neoplasm of ascending colon: Secondary | ICD-10-CM

## 2017-12-28 DIAGNOSIS — E538 Deficiency of other specified B group vitamins: Secondary | ICD-10-CM

## 2017-12-28 DIAGNOSIS — D72819 Decreased white blood cell count, unspecified: Secondary | ICD-10-CM

## 2017-12-28 DIAGNOSIS — Z90722 Acquired absence of ovaries, bilateral: Secondary | ICD-10-CM | POA: Insufficient documentation

## 2017-12-28 DIAGNOSIS — Z9221 Personal history of antineoplastic chemotherapy: Secondary | ICD-10-CM

## 2017-12-28 DIAGNOSIS — Z923 Personal history of irradiation: Secondary | ICD-10-CM

## 2017-12-28 DIAGNOSIS — Z803 Family history of malignant neoplasm of breast: Secondary | ICD-10-CM | POA: Diagnosis not present

## 2017-12-28 DIAGNOSIS — Z800 Family history of malignant neoplasm of digestive organs: Secondary | ICD-10-CM | POA: Diagnosis not present

## 2017-12-28 DIAGNOSIS — D696 Thrombocytopenia, unspecified: Secondary | ICD-10-CM

## 2017-12-28 LAB — CBC WITH DIFFERENTIAL/PLATELET
Abs Immature Granulocytes: 0 10*3/uL (ref 0.00–0.07)
Basophils Absolute: 0 10*3/uL (ref 0.0–0.1)
Basophils Relative: 0 %
Eosinophils Absolute: 0 10*3/uL (ref 0.0–0.5)
Eosinophils Relative: 0 %
HCT: 40.3 % (ref 36.0–46.0)
Hemoglobin: 13.3 g/dL (ref 12.0–15.0)
Immature Granulocytes: 0 %
Lymphocytes Relative: 31 %
Lymphs Abs: 1 10*3/uL (ref 0.7–4.0)
MCH: 31.5 pg (ref 26.0–34.0)
MCHC: 33 g/dL (ref 30.0–36.0)
MCV: 95.5 fL (ref 80.0–100.0)
Monocytes Absolute: 0.2 10*3/uL (ref 0.1–1.0)
Monocytes Relative: 7 %
Neutro Abs: 1.9 10*3/uL (ref 1.7–7.7)
Neutrophils Relative %: 62 %
Platelets: 149 10*3/uL — ABNORMAL LOW (ref 150–400)
RBC: 4.22 MIL/uL (ref 3.87–5.11)
RDW: 12 % (ref 11.5–15.5)
WBC: 3.1 10*3/uL — ABNORMAL LOW (ref 4.0–10.5)
nRBC: 0 % (ref 0.0–0.2)

## 2017-12-28 LAB — COMPREHENSIVE METABOLIC PANEL
ALT: 12 U/L (ref 0–44)
AST: 22 U/L (ref 15–41)
Albumin: 3.8 g/dL (ref 3.5–5.0)
Alkaline Phosphatase: 62 U/L (ref 38–126)
Anion gap: 6 (ref 5–15)
BUN: 14 mg/dL (ref 8–23)
CO2: 30 mmol/L (ref 22–32)
Calcium: 8.6 mg/dL — ABNORMAL LOW (ref 8.9–10.3)
Chloride: 101 mmol/L (ref 98–111)
Creatinine, Ser: 1.03 mg/dL — ABNORMAL HIGH (ref 0.44–1.00)
GFR calc Af Amer: >60 mL/min (ref >60)
GFR calc non Af Amer: 55 mL/min — ABNORMAL LOW (ref >60)
Glucose, Bld: 116 mg/dL — ABNORMAL HIGH (ref 70–99)
Potassium: 3.9 mmol/L (ref 3.5–5.1)
Sodium: 137 mmol/L (ref 135–145)
Total Bilirubin: 0.6 mg/dL (ref 0.3–1.2)
Total Protein: 7.2 g/dL (ref 6.5–8.1)

## 2017-12-28 NOTE — Progress Notes (Signed)
Here for follow up " I really feel good" no c/o

## 2017-12-28 NOTE — Progress Notes (Signed)
[facility] day:  12/28/2017  Chief Complaint: Amy Bird is a 67 y.o. female with a history of left breast cancer (2002), right sided colon cancer (2014), mild pancytopenia, and unexplained weight loss who is seen for 3 month assessment.  HPI:  The patient was last seen in the medical oncology clinic on 09/15/2017.  At that time, she was feeling better.  She had gained 2 pounds.  Exam was stable.  B12 was 760 on 09/24/2017.  During the interim, patient feels "well". She denies any acute concerns. She is sleeping better. .Patient denies that she has experienced any B symptoms. She denies any interval infections.  Patient denies bleeding; no hematochezia, melena, or gross hematuria.  Patient does not verbalize any concerns with regards to her breasts today. Patient performs monthly self breast examinations as recommended.   Patient with LEFT upper extremity lymphedema. She is wearing a sleeve today in clinic.   Patient advises that she maintains an adequate appetite. She is eating well. Weight today is 111 lb 1.6 oz (50.4 kg), which compared to her last visit to the clinic, represents a 1 pound weight gain. Patient states, "I have been eating banana splits. I don't know how I lost weight".  Patient is drinking and Ensure shake once daily.   Patient denies pain in the clinic today.   Past Medical History:  Diagnosis Date  . Anemia   . Arthritis   . Breast cancer, left breast ([place]) 2002   Left Mastectomy, chemo + Rad tx's.  . Colon cancer ([place] 10/2012   Partial colon resection and chemo tx's.  . Diverticulitis   . Glaucoma   . Hearing loss   . Hyperlipidemia 20 years  . Kidney stone   . Lymphedema of left arm   . Neuromuscular disorder ([place])    neuropathy  feet and hands from chemo tx  . Pneumonia   . Shingles   . Vertigo    last episode around Jan 2018    Past Surgical History:  Procedure Laterality Date  . ABDOMINAL  HYSTERECTOMY  1973  . APPENDECTOMY  1969  . CATARACT EXTRACTION W/PHACO Left 07/11/2015   Procedure: CATARACT EXTRACTION PHACO AND INTRAOCULAR LENS PLACEMENT ([place]) left eye;  Surgeon: Leandrew Koyanagi, MD;  Location: [facility];  Service: Ophthalmology;  Laterality: Left;  . CATARACT EXTRACTION W/PHACO Right 08/27/2016   Procedure: CATARACT EXTRACTION PHACO AND INTRAOCULAR LENS PLACEMENT (IOC);  Surgeon: Leandrew Koyanagi, MD;  Location: [facility];  Service: Ophthalmology;  Laterality: Right;  IVA TOPICAL RIGHT  . CESAREAN SECTION    . COLON RESECTION    . COLONOSCOPY  2004  . COLONOSCOPY N/A 04/04/2016   Procedure: COLONOSCOPY;  Surgeon: Lollie Sails, MD;  Location: [facility] ENDOSCOPY;  Service: Endoscopy;  Laterality: N/A;  . ESOPHAGOGASTRODUODENOSCOPY N/A 04/04/2016   Procedure: ESOPHAGOGASTRODUODENOSCOPY (EGD);  Surgeon: Lollie Sails, MD;  Location: [facility] ENDOSCOPY;  Service: Endoscopy;  Laterality: N/A;  . kidney stone removal     . MASTECTOMY Bilateral 2002,2010  . salpingo oophorectmy   1973    Family History  Problem Relation Age of Onset  . Hypertension Mother   . Alzheimer's disease Mother   . Cancer Father        prostate  . Prostate cancer Father   . Cancer Sister        breast  . Hypertension Sister   . Cancer Brother        stomach  .  Hypertension Brother   . Sarcoidosis Other        positive family history.  . Cancer Sister        breast  . Cancer Sister        breast    Social History:  reports that she quit smoking about 17 years ago. Her smoking use included cigarettes. She has a 52.50 pack-year smoking history. She has never used smokeless tobacco. She reports that she does not drink alcohol or use drugs.  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  She quit smoking in 2003.  She lives in [place].  She belongs to the [employer].  The patient is alone today.  Allergies:  Allergies  Allergen Reactions  .  Hydrocodone-Acetaminophen Other (See Comments) and Shortness Of Breath    Not witnessed info gathered as part of patient screening.  PT states her chest felt very heavy and difficulty breathing.  . Sulfa Antibiotics Swelling  . Cefuroxime Diarrhea  . Hydrocodone-Ibuprofen Other (See Comments)    Patient feels like there is a heavy wight on her chest  . Levofloxacin Hives  . Statins Other (See Comments)    Leg cramps and groin pain  . Other Itching    Tree Nuts  . Tape Rash  . Tree Extract Itching    Current Medications: Current Outpatient Medications  Medication Sig Dispense Refill  . Calcium Carb-Cholecalciferol (CALCIUM 600 + D PO) Take 2 tablets by mouth daily.    Marland Kitchen latanoprost (XALATAN) 0.005 % ophthalmic solution Place 1 drop into both eyes at bedtime.    . vitamin C (ASCORBIC ACID) 500 MG tablet Take 500 mg by mouth daily.    . Multiple Vitamin (MULTIVITAMIN) capsule Take 1 capsule by mouth daily.    . vitamin B-12 (CYANOCOBALAMIN) 1000 MCG tablet Take 1,000 mcg by mouth daily.     No current facility-administered medications for this visit.     Review of Systems:  GENERAL:  Feels "good". "Doing a little more".  No fevers, sweats or weight loss.  Weight up 1 pound. PERFORMANCE STATUS (ECOG):  1 HEENT:  No visual changes, runny nose, sore throat, mouth sores or tenderness. Lungs: No shortness of breath or cough.  No hemoptysis. Cardiac:  No chest pain, palpitations, orthopnea, or PND. GI:  No nausea, vomiting, diarrhea, constipation, melena or hematochezia. GU:  No urgency, frequency, dysuria, or hematuria. Musculoskeletal:  No back pain.  No joint pain.  No muscle tenderness. Extremities:  No pain or swelling. Skin:  No rashes or skin changes. Neuro:  Neuropathy in feet.  No headache, numbness or weakness, balance or coordination issues. Endocrine:  No diabetes, thyroid issues, hot flashes or night sweats. Psych:  No mood changes, depression or anxiety. Pain:  No focal  pain. Review of systems:  All other systems reviewed and found to be negative.   Physical Exam:  Blood pressure 102/67, pulse 98, temperature (!) 96.9 F (36.1 C), temperature source Tympanic, resp. rate 18, weight 111 lb 1.6 oz (50.4 kg). GENERAL:  Well developed, well nourished, woman sitting comfortably in the exam room in no acute distress. MENTAL STATUS:  Alert and oriented to person, place and time. HEAD:  Black hair.  Normocephalic, atraumatic, face symmetric, no Cushingoid features. EYES:  Glasses.  Brown eyes.  Pupils equal round and reactive to light and accomodation.  No conjunctivitis or scleral icterus. ENT:  Oropharynx clear without lesion.  Tongue normal. Mucous membranes moist.  RESPIRATORY:  Clear to auscultation without  rales, wheezes or rhonchi. CARDIOVASCULAR:  Regular rate and rhythm without murmur, rub or gallop. ABDOMEN:  Soft, non-tender, with active bowel sounds, and no hepatosplenomegaly.  No masses. SKIN:  No rashes, ulcers or lesions. EXTREMITIES: Left upper extremity lymphedema sleeve.  No lower extremity edema, no skin discoloration or tenderness.  No palpable cords. LYMPH NODES: No palpable cervical, supraclavicular, axillary or inguinal adenopathy  NEUROLOGICAL: Unremarkable. PSYCH:  Appropriate.    Appointment on 12/28/2017  Component Date Value Ref Range Status  . Sodium 12/28/2017 137  135 - 145 mmol/L Final  . Potassium 12/28/2017 3.9  3.5 - 5.1 mmol/L Final  . Chloride 12/28/2017 101  98 - 111 mmol/L Final  . CO2 12/28/2017 30  22 - 32 mmol/L Final  . Glucose, Bld 12/28/2017 116* 70 - 99 mg/dL Final  . BUN 12/28/2017 14  8 - 23 mg/dL Final  . Creatinine, Ser 12/28/2017 1.03* 0.44 - 1.00 mg/dL Final  . Calcium 12/28/2017 8.6* 8.9 - 10.3 mg/dL Final  . Total Protein 12/28/2017 7.2  6.5 - 8.1 g/dL Final  . Albumin 12/28/2017 3.8  3.5 - 5.0 g/dL Final  . AST 12/28/2017 22  15 - 41 U/L Final  . ALT 12/28/2017 12  0 - 44 U/L Final  . Alkaline  Phosphatase 12/28/2017 62  38 - 126 U/L Final  . Total Bilirubin 12/28/2017 0.6  0.3 - 1.2 mg/dL Final  . GFR calc non Af Amer 12/28/2017 55* >60 mL/min Final  . GFR calc Af Amer 12/28/2017 >60  >60 mL/min Final  . Anion gap 12/28/2017 6  5 - 15 Final   Performed at [facility], [address], [place], [place] [address]  . WBC 12/28/2017 3.1* 4.0 - 10.5 K/uL Final  . RBC 12/28/2017 4.22  3.87 - 5.11 MIL/uL Final  . Hemoglobin 12/28/2017 13.3  12.0 - 15.0 g/dL Final  . HCT 12/28/2017 40.3  36.0 - 46.0 % Final  . MCV 12/28/2017 95.5  80.0 - 100.0 fL Final  . MCH 12/28/2017 31.5  26.0 - 34.0 pg Final  . MCHC 12/28/2017 33.0  30.0 - 36.0 g/dL Final  . RDW 12/28/2017 12.0  11.5 - 15.5 % Final  . Platelets 12/28/2017 149* 150 - 400 K/uL Final  . nRBC 12/28/2017 0.0  0.0 - 0.2 % Final  . Neutrophils Relative % 12/28/2017 62  % Final  . Neutro Abs 12/28/2017 1.9  1.7 - 7.7 K/uL Final  . Lymphocytes Relative 12/28/2017 31  % Final  . Lymphs Abs 12/28/2017 1.0  0.7 - 4.0 K/uL Final  . Monocytes Relative 12/28/2017 7  % Final  . Monocytes Absolute 12/28/2017 0.2  0.1 - 1.0 K/uL Final  . Eosinophils Relative 12/28/2017 0  % Final  . Eosinophils Absolute 12/28/2017 0.0  0.0 - 0.5 K/uL Final  . Basophils Relative 12/28/2017 0  % Final  . Basophils Absolute 12/28/2017 0.0  0.0 - 0.1 K/uL Final  . Immature Granulocytes 12/28/2017 0  % Final  . Abs Immature Granulocytes 12/28/2017 0.00  0.00 - 0.07 K/uL Final   Performed at [facility], [address] [place], [place] [address]    Assessment:  Amy Bird is a 67 y.o. female with a history of left breast cancer (2002), right sided stage III colon cancer (2014), and mild pancytopenia.  She was diagnosed with left breast cancer in 2002.  She underwent left mastectomy in 03/2000 (no pathology available).  She received Adriamycin and Cytoxan [facility]) which  completed in 07/2000 followed by Herceptin and Taxotere in 08/2000.  She did not  receive hormonal therapy.  She underwent right sided mastectomy in 02/2008.  CA27.29 was 40.9 on 06/06/2010, 42.6 on 02/04/2011, 35.3 on 03/31/2012, 38.1 on 11/09/2012, 52.3 on 09/17/2015, 50.5 on 10/01/2015, 47.8 on 02/11/2016, 36.1 on 06/20/2016, 36.6 on 10/20/2016, 42.6 on 02/23/2017, 44.6 on 03/26/2017, 37.7 on 05/22/2017, 47.1 on 08/25/2017, and 45.7 on 12/28/2017.  She has intermittent anemia, neutropenia, and thrombocytopenia since 10/2012.  Bone marrow aspiration and biopsy in 10/2012 was negative for any myelodysplastic syndrome.  Bone marrow revealed no iron storage.  CBC on 09/17/2015 revealed a hematocrit 39.6, hemoglobin 13.3, MCV 96, platelets 128,000, white count 3000 with ANC of 1700. B12 was 266 (low normal) with a normal MMA on 09/19/2015.  Normal studies included:  folate, TSH, free T4.  Ferritin was 20.    She was diagnosed with colon cancer in 12/2012.  She underwent right hemicolectomy on 12/24/2012.  Pathology revealed a 3.7 cm grade II adenocarcinoma which extended through the muscularis propria and into the adjacent adipose.  One of 27 lymph nodes were positive.  Pathologic stage III ([id number]).    She was treated on CALGB protocol with FOLFOX chemotherapy +/- celecoxib.  She withdrew from the study in 02/25/2013.  She described trouble with counts.  She received 6 cycles of FOLFOX (01/28/2013 - 04/29/2013).  Colonoscopy on 04/04/2016 revealed a 2 mm polyp in the distal sigmoid colon.  Pathology revealed a serrated polyp.  EGD on 04/04/2016 revealed LA grade A esophagitis, non-bleeding erosive gastropathy, gastritis, and duodenitis.  Gastric and GEJ pathology revealed moderate chronic active inflammation, H pylori associated.  CEA was 3.8 on 12/09/2012, 6.3 on 01/27/2013, 10.6 on 05/26/2013, 5.9 on 09/22/2013, 7.1 on 05/10/2014, 7.0 on 11/08/2014, 5.8 on 05/08/2015, 7.2 on 09/17/2015, 5.9 on 10/01/2015, 6.5 on 02/11/2016, 6.9 on 10/20/2016, 7.6 on 02/23/2017, 7.3 on 08/25/2017,  and 6.7 on 12/28/2017.  PET scan on 10/09/2015 revealed no evidence of recurrent disease.  Chest, abdomen, and pelvic CT scan on 02/15/2016 revealed no findings of recurrent malignancy.  There were radiation therapy related opacities in the left lung.  There was prominent stool throughout the colon.  There was lumbar degenerative disc disease.  PET scan on 09/11/2017 revealed no hypermetabolic residual or recurrent disease.  She notes gradual weight loss since discontinuation of an anti-depressant years ago.  She previously weighed 110 pounds. TSH and free T4 were normal on 02/11/2016.  Labs on 02/26/2016 revealed a normal cortisol, sed rate, and ANA.  HIV testing was negative.  TSH and free T4 were normal on 08/27/2017.  Left lower extremity duplex on 09/19/2015 revealed no evidence of DVT.  She had a left popliteal fossa Baker's cyst.  She has been diagnosed with H pylori (stool antigen + 05/17/2016) and treated x 3.  She has B12 deficiency.  B12 was 251 on 08/27/2017 and 760 on 09/24/2017.  Folate was 12.  She began oral B12 on 08/28/2017.  Symptomatically, she feels good.  She is eating well and has gained 1 pound.  Exam is stable.  CA27.29 is 45.7.  CEA is 6.7.  Plan: 1.   Discuss labs from last visit. 2.   Breast and colon cancer: Discuss persistent elevation of tumor markers.    PET scan on 09/11/2017 revealed no evidence of metastatic disease.  Continue to monitor.  Anticipate reimaging if markers increase by > 10-15%. 3.   Leukopenia:  WBC improved.  Oral B12 started on  08/28/2017.  Vitamin B12 has improved from 251 on 08/27/2017 to 760 on 09/24/2017. Continue to monitor. Discuss consideration of bone marrow if counts decline (r/o MDS). 4.   Lymphedema: stable Continue using lymphedema sleeve.  5.   Protein calorie malnutrition: improved Weight up 1 pound. Patient eating well, including banana splits. Follow-up with registered dietician on 12/31/2017. Weight check in 3  months. 6.   RTC in 6 months for MD assessment and labs (CBC with diff, CMP, CEA, CA27.29).   Honor Loh, NP  12/28/2017, 3:48 PM   I saw and evaluated the patient, participating in the key portions of the service and reviewing pertinent diagnostic studies and records.  I reviewed the nurse practitioner's note and agree with the findings and the plan.  The assessment and plan were discussed with the patient.  Several questions were asked by the patient and answered.   Nolon Stalls, MD 12/28/2017,3:48 PM

## 2017-12-29 LAB — CANCER ANTIGEN 27.29: CA 27.29: 45.7 U/mL — ABNORMAL HIGH (ref 0.0–38.6)

## 2017-12-29 LAB — CEA: CEA: 6.7 ng/mL — ABNORMAL HIGH (ref 0.0–4.7)

## 2017-12-31 ENCOUNTER — Inpatient Hospital Stay: Payer: Medicare Other

## 2017-12-31 NOTE — Progress Notes (Addendum)
Nutrition Follow-up:  Patient with history of breast cancer 2002 and right colon cancer 2014.  Noted per MD following PET in 09/11/17 no recurrent disease  Met with patient today in clinic.  Reports that she is feeling more tired and appetite has decreased a little bit from what it once was. Reports a girl she works with recently printed her off some new recipes to try.  Reports initially that she has been drinking 1 ensure per day but then later reports only drinking 1/2 due to it causing her to have a headache.  Patient reports she does not understand why weight went down a little bit.  Reports that she thinks it has something to do with the hpylori because she has never had a problem until after that.    Medications: reviewed  Labs: creatinine 1.03, glucose 116  Anthropometrics:   Weight decreased to 111 lb  1.6 oz on 12/9 from 113 lb on 11/14.     NUTRITION DIAGNOSIS: Unintentional weight loss continues   INTERVENTION:  Discussed appetite stimulants as possible option to help stimulate appetite due to patient feels that she is eating less.   Reviewed calorie goals with patient today. Provided patient sample menu of amount of food needed to reach estimated calories per day.   Provided patient with recipe book of new recipes to try with calorie content included.  Ask patient to complete food diary and bring back to RD to review at next visit.   Discussed how to read foods labels.   Offered ensure today and coupons and she declined.     MONITORING, EVALUATION, GOAL: Patient will consume adequate calories and protein to prevent weight loss   NEXT VISIT: Thursday, Jan 16   Evelean Bigler B. [name], [place], [place] Registered Dietitian [phone] (pager)

## 2018-02-04 ENCOUNTER — Inpatient Hospital Stay: Payer: Medicare Other | Attending: Hematology and Oncology

## 2018-02-04 NOTE — Progress Notes (Signed)
Nutrition Follow-up:  Patient with history of breast cancer 2002 and right colon cancer 2014.  Noted per MD following PET in 09/11/17 no recurrent disease.  Met with patient in clinic.  Patient reports that her appetite is good. Reports that she is feeling better and feels different if she does not eat.  Reports that her nails and skin are not as cracked and she attributes that to better nutrition.  Reports that her pants fit tighter.  Did not bring in food record for RD to review.  Is not drinking oral nutrition supplements and does not feel like she needs to drink them.  Reports for her birthday dinner she ate fried chicken, fried okra, green beans, cornbread with honey and butter and blackberry cobbler with ice cream.  Reports that she is trying to eat more at breakfast (1 piece whole grain bread with peanut butter and jelly vs few peanut butter crackers).  Reports that she gets constipated some from eating "lot of carbs" and eats fruits and prunes to help with that.  Denies other nutrition impact symptoms at this time.  Reports that she has to start taking more time off work and rest and focus on herself.      Medications: vit b 12, vit c and calcium with vit D  Labs: reviewed  Anthropometrics:   Weight checked today in clinic 111 lb 7 oz stable from weight of 111 lb 1.6 oz on 12/12.    Decreased from weight of 113 lb on 11/14.    NUTRITION DIAGNOSIS: Unintentional weight loss improved   INTERVENTION:  Discussed importance of keeping food log to keep patient on track for meeting calorie goals.  Patient declined any more nutrition supplements Offered encouragement to continue trying to meet calorie goals to gain weight.      MONITORING, EVALUATION, GOAL: weight trends, intake   NEXT VISIT: March 9 after labs  Johnaton Sonneborn B. [name], [place], [place] Registered Dietitian [phone] (pager)

## 2018-02-12 ENCOUNTER — Encounter: Payer: Self-pay | Admitting: Internal Medicine

## 2018-02-12 ENCOUNTER — Ambulatory Visit: Payer: Medicare Other | Admitting: Family

## 2018-02-12 ENCOUNTER — Ambulatory Visit: Payer: Medicare Other | Admitting: Internal Medicine

## 2018-02-12 VITALS — BP 102/66 | HR 90 | Temp 98.1°F | Wt 115.0 lb

## 2018-02-12 DIAGNOSIS — J00 Acute nasopharyngitis [common cold]: Secondary | ICD-10-CM

## 2018-02-12 MED ORDER — AMOXICILLIN 875 MG PO TABS: 875.0000 mg | ORAL_TABLET | Freq: Two times a day (BID) | ORAL | 0 refills | Status: DC

## 2018-02-12 NOTE — Patient Instructions (Signed)

## 2018-02-12 NOTE — Progress Notes (Signed)
HPI  Pt presents to the clinic today with c/oheadache,  runny nose, nasal congestion, sore throat and cough. She reports this started 1-2 weeks ago. The headache is located in her forehead. She describes the pain as pressure. She denies visual changes or dizziness. She is blowing clear mucous out of her nose.She denies difficulty swallowing. The cough is productive of thick yellow mucous.  She denies ear pain, sore throat or shortness of breath. She denies fever or body aches but has had some chills. She has tried Tylenol with some relief. She has not had sick contacts that she is aware of. Flu and pneumonia vaccines are UTD.  Review of Systems      Past Medical History:  Diagnosis Date  . Anemia   . Arthritis   . Breast cancer, left breast ([place]) 2002   Left Mastectomy, chemo + Rad tx's.  . Colon cancer ([place]) 10/2012   Partial colon resection and chemo tx's.  . Diverticulitis   . Glaucoma   . Hearing loss   . Hyperlipidemia 20 years  . Kidney stone   . Lymphedema of left arm   . Neuromuscular disorder ([place])    neuropathy  feet and hands from chemo tx  . Pneumonia   . Shingles   . Vertigo    last episode around Jan 2018    Family History  Problem Relation Age of Onset  . Hypertension Mother   . Alzheimer's disease Mother   . Cancer Father        prostate  . Prostate cancer Father   . Cancer Sister        breast  . Hypertension Sister   . Cancer Brother        stomach  . Hypertension Brother   . Sarcoidosis Other        positive family history.  . Cancer Sister        breast  . Cancer Sister        breast    Social History   Socioeconomic History  . Marital status: Divorced    Spouse name: Not on file  . Number of children: 2  . Years of education: Not on file  . Highest education level: Not on file  Occupational History  . Occupation: Banker work    Comment: [employer] time  Social Needs  . Financial resource strain: Not on file  . Food  insecurity:    Worry: Not on file    Inability: Not on file  . Transportation needs:    Medical: Not on file    Non-medical: Not on file  Tobacco Use  . Smoking status: Former Smoker    Packs/day: 1.50    Years: 35.00    Pack years: 52.50    Types: Cigarettes    Last attempt to quit: 2002    Years since quitting: 18.0  . Smokeless tobacco: Never Used  Substance and Sexual Activity  . Alcohol use: No  . Drug use: No  . Sexual activity: Not on file  Lifestyle  . Physical activity:    Days per week: Not on file    Minutes per session: Not on file  . Stress: Not on file  Relationships  . Social connections:    Talks on phone: Not on file    Gets together: Not on file    Attends religious service: Not on file    Active member of club or organization: Not on file    Attends meetings  of clubs or organizations: Not on file    Relationship status: Not on file  . Intimate partner violence:    Fear of current or ex partner: Not on file    Emotionally abused: Not on file    Physically abused: Not on file    Forced sexual activity: Not on file  Other Topics Concern  . Not on file  Social History Narrative   Lives alone.   Daughter died   Son lives nearby      No living will   Would want son as health care POA. Alternate would be sister Mardene Celeste   Would accept resuscitation attempts   Not sure about tube feeds    Allergies  Allergen Reactions  . Hydrocodone-Acetaminophen Other (See Comments) and Shortness Of Breath    Not witnessed info gathered as part of patient screening.  PT states her chest felt very heavy and difficulty breathing.  . Sulfa Antibiotics Swelling  . Cefuroxime Diarrhea  . Hydrocodone-Ibuprofen Other (See Comments)    Patient feels like there is a heavy wight on her chest  . Levofloxacin Hives  . Statins Other (See Comments)    Leg cramps and groin pain  . Other Itching    Tree Nuts  . Tape Rash  . Tree Extract Itching     Constitutional:  Pt  reports headache, chills. Denies fatigue, fever or abrupt weight changes.  HEENT:  Positive runny nose, nasal congestion, and sore throat. Denies eye redness, eye pain, pressure behind the eyes, facial pain, ear pain, ringing in the ears, wax buildup. Respiratory: Positive cough. Denies difficulty breathing or shortness of breath.  Cardiovascular: Denies chest pain, chest tightness, palpitations or swelling in the hands or feet.   No other specific complaints in a complete review of systems (except as listed in HPI above).  Objective:   BP 102/66   Pulse 90   Temp 98.1 F (36.7 C) (Oral)   Wt 115 lb (52.2 kg)   SpO2 99%   BMI 21.03 kg/m  Wt Readings from Last 3 Encounters:  02/12/18 115 lb (52.2 kg)  12/28/17 111 lb 1.6 oz (50.4 kg)  12/03/17 113 lb (51.3 kg)     General: Appears his stated age,  in NAD. HEENT: Head: normal shape and size, no sinus tenderness noted; Ears: Tm's gray and intact, normal light reflex; Nose: mucosa pink and moist, turbinates mildly swollen; Throat/Mouth: Teeth present, mucosa pink and moist, no exudate noted, no lesions or ulcerations noted.  Neck: Bilateral anterior cervical lymphadenopathy.  Cardiovascular: Normal rate and rhythm. S1,S2 noted.  No murmur, rubs or gallops noted.  Pulmonary/Chest: Normal effort and positive vesicular breath sounds. No respiratory distress. No wheezes, rales or ronchi noted.       Assessment & Plan:   Upper Respiratory Infection with Cough:  Get some rest and drink plenty of water Do salt water gargles for the sore throat eRx for Amoxil 875 mg BID x 10  days Delsym as needed for cough  RTC as needed or if symptoms persist.   Webb Silversmith, NP

## 2018-03-04 ENCOUNTER — Telehealth: Payer: Self-pay | Admitting: Internal Medicine

## 2018-03-04 MED ORDER — AMOXICILLIN-POT CLAVULANATE 875-125 MG PO TABS: 1.0000 | ORAL_TABLET | Freq: Two times a day (BID) | ORAL | 0 refills | Status: DC

## 2018-03-04 NOTE — Telephone Encounter (Signed)
Please let her know that I sent her a stronger antibiotic to try. She needs to take it on a full stomach. She should let me know if she is still not improving next week

## 2018-03-04 NOTE — Telephone Encounter (Signed)
Pt called office in regards to being seen on 1/24. Pt was prescribed amoxicillin. She has taken all the medication but feels that all symptoms : sinus drainage, scratchy throat, cough has came back. Pt wants to know if she should be re-evaluated. Please advise

## 2018-03-04 NOTE — Telephone Encounter (Signed)
Spoke to pt

## 2018-03-04 NOTE — Addendum Note (Signed)
Addended by: Viviana Simpler I on: 03/04/2018 01:22 PM   Modules accepted: Orders

## 2018-03-25 ENCOUNTER — Encounter: Payer: Medicare Other | Admitting: Internal Medicine

## 2018-03-29 ENCOUNTER — Inpatient Hospital Stay: Payer: Medicare Other

## 2018-03-29 ENCOUNTER — Other Ambulatory Visit: Payer: Self-pay

## 2018-03-29 ENCOUNTER — Encounter (INDEPENDENT_AMBULATORY_CARE_PROVIDER_SITE_OTHER): Payer: Self-pay

## 2018-03-29 ENCOUNTER — Inpatient Hospital Stay: Payer: Medicare Other | Attending: Hematology and Oncology

## 2018-03-29 ENCOUNTER — Telehealth: Payer: Self-pay | Admitting: *Deleted

## 2018-03-29 NOTE — Telephone Encounter (Signed)
115.6 lbs

## 2018-03-29 NOTE — Telephone Encounter (Signed)
  Excellent.  She is gaining weight.  M

## 2018-04-01 ENCOUNTER — Other Ambulatory Visit: Payer: Self-pay

## 2018-04-01 ENCOUNTER — Encounter: Payer: Self-pay | Admitting: Internal Medicine

## 2018-04-01 ENCOUNTER — Ambulatory Visit (INDEPENDENT_AMBULATORY_CARE_PROVIDER_SITE_OTHER): Payer: Medicare Other | Admitting: Internal Medicine

## 2018-04-01 VITALS — BP 100/64 | HR 98 | Temp 98.9°F | Ht 62.0 in | Wt 114.8 lb

## 2018-04-01 DIAGNOSIS — E441 Mild protein-calorie malnutrition: Secondary | ICD-10-CM | POA: Diagnosis not present

## 2018-04-01 DIAGNOSIS — E236 Other disorders of pituitary gland: Secondary | ICD-10-CM

## 2018-04-01 DIAGNOSIS — C182 Malignant neoplasm of ascending colon: Secondary | ICD-10-CM | POA: Diagnosis not present

## 2018-04-01 DIAGNOSIS — Z7189 Other specified counseling: Secondary | ICD-10-CM

## 2018-04-01 DIAGNOSIS — D709 Neutropenia, unspecified: Secondary | ICD-10-CM

## 2018-04-01 DIAGNOSIS — D696 Thrombocytopenia, unspecified: Secondary | ICD-10-CM

## 2018-04-01 DIAGNOSIS — Z0000 Encounter for general adult medical examination without abnormal findings: Secondary | ICD-10-CM | POA: Diagnosis not present

## 2018-04-01 DIAGNOSIS — G620 Drug-induced polyneuropathy: Secondary | ICD-10-CM | POA: Diagnosis not present

## 2018-04-01 DIAGNOSIS — T451X5A Adverse effect of antineoplastic and immunosuppressive drugs, initial encounter: Secondary | ICD-10-CM

## 2018-04-01 DIAGNOSIS — I700 Atherosclerosis of aorta: Secondary | ICD-10-CM

## 2018-04-01 DIAGNOSIS — N183 Chronic kidney disease, stage 3 unspecified: Secondary | ICD-10-CM | POA: Insufficient documentation

## 2018-04-01 NOTE — Assessment & Plan Note (Signed)
On CT scan Mild Will hold off on Rx

## 2018-04-01 NOTE — Assessment & Plan Note (Signed)
Weight has stabilized Discussed protein and muscle strengthening

## 2018-04-01 NOTE — Progress Notes (Signed)
Subjective:    Patient ID: Amy Bird, female    DOB: Apr 24, 1950, 68 y.o.   MRN: [id number]  HPI Here for Medicare wellness visit and follow up of chronic health conditions Reviewed form and advanced directives Reviewed other doctors No tobacco or alcohol Exercises a little---discussed. Had been going to gym--but slacked off No falls No depression or anhedonia Independent with instrumental ADLs No memory gain  Finally feels better from the respiratory infection  Had PET scan last fall No evidence of metastatic disease from the colon cancer Just surveillance for this Mild aortic atherosclerosis on chest CT scan  Low platelets Does bruise easy Stable neutropenia  No dizziness or syncope No chest pain No SOB Slight left ankle edema---better in AM  Known pituitary cyst No light up on PET scan  Has GFR in 50's No progression  Chronic neuropathy since chemo Hands and feet are numb and fall asleep--no sig pain  Weight is still low Slight increase lately Eating much better  Current Outpatient Medications on File Prior to Visit  Medication Sig Dispense Refill  . Calcium Carb-Cholecalciferol (CALCIUM 600 + D PO) Take 2 tablets by mouth daily.    Marland Kitchen latanoprost (XALATAN) 0.005 % ophthalmic solution Place 1 drop into both eyes at bedtime.    . vitamin B-12 (CYANOCOBALAMIN) 1000 MCG tablet Take 1,000 mcg by mouth daily.    . vitamin C (ASCORBIC ACID) 500 MG tablet Take 500 mg by mouth daily.     No current facility-administered medications on file prior to visit.     Allergies  Allergen Reactions  . Hydrocodone-Acetaminophen Other (See Comments) and Shortness Of Breath    Not witnessed info gathered as part of patient screening.  PT states her chest felt very heavy and difficulty breathing.  . Sulfa Antibiotics Swelling  . Cefuroxime Diarrhea  . Hydrocodone-Ibuprofen Other (See Comments)    Patient feels like there is a heavy wight on her chest  . Levofloxacin  Hives  . Statins Other (See Comments)    Leg cramps and groin pain  . Other Itching    Tree Nuts  . Tape Rash  . Tree Extract Itching    Past Medical History:  Diagnosis Date  . Anemia   . Arthritis   . Breast cancer, left breast ([place]) 2002   Left Mastectomy, chemo + Rad tx's.  . Colon cancer ([place]) 10/2012   Partial colon resection and chemo tx's.  . Diverticulitis   . Glaucoma   . Hearing loss   . Hyperlipidemia 20 years  . Kidney stone   . Lymphedema of left arm   . Neuromuscular disorder ([place])    neuropathy  feet and hands from chemo tx  . Pneumonia   . Shingles   . Vertigo    last episode around Jan 2018    Past Surgical History:  Procedure Laterality Date  . ABDOMINAL HYSTERECTOMY  1973  . APPENDECTOMY  1969  . CATARACT EXTRACTION W/PHACO Left 07/11/2015   Procedure: CATARACT EXTRACTION PHACO AND INTRAOCULAR LENS PLACEMENT ([place]) left eye;  Surgeon: Leandrew Koyanagi, MD;  Location: [facility];  Service: Ophthalmology;  Laterality: Left;  . CATARACT EXTRACTION W/PHACO Right 08/27/2016   Procedure: CATARACT EXTRACTION PHACO AND INTRAOCULAR LENS PLACEMENT ([facility]);  Surgeon: Leandrew Koyanagi, MD;  Location: [facility];  Service: Ophthalmology;  Laterality: Right;  IVA TOPICAL RIGHT  . CESAREAN SECTION    . COLON RESECTION    . COLONOSCOPY  2004  . COLONOSCOPY N/A  04/04/2016   Procedure: COLONOSCOPY;  Surgeon: Lollie Sails, MD;  Location: [facility] ENDOSCOPY;  Service: Endoscopy;  Laterality: N/A;  . ESOPHAGOGASTRODUODENOSCOPY N/A 04/04/2016   Procedure: ESOPHAGOGASTRODUODENOSCOPY (EGD);  Surgeon: Lollie Sails, MD;  Location: [facility] ENDOSCOPY;  Service: Endoscopy;  Laterality: N/A;  . kidney stone removal     . MASTECTOMY Bilateral 2002,2010  . salpingo oophorectmy   1973    Family History  Problem Relation Age of Onset  . Hypertension Mother   . Alzheimer's disease Mother   . Cancer Father        prostate  . Prostate cancer Father   .  Cancer Sister        breast  . Hypertension Sister   . Cancer Brother        stomach  . Hypertension Brother   . Sarcoidosis Other        positive family history.  . Cancer Sister        breast  . Cancer Sister        breast    Social History   Socioeconomic History  . Marital status: Divorced    Spouse name: Not on file  . Number of children: 2  . Years of education: Not on file  . Highest education level: Not on file  Occupational History  . Occupation: Banker work    Comment: [employer] time  Social Needs  . Financial resource strain: Not on file  . Food insecurity:    Worry: Not on file    Inability: Not on file  . Transportation needs:    Medical: Not on file    Non-medical: Not on file  Tobacco Use  . Smoking status: Former Smoker    Packs/day: 1.50    Years: 35.00    Pack years: 52.50    Types: Cigarettes    Last attempt to quit: 2002    Years since quitting: 18.2  . Smokeless tobacco: Never Used  Substance and Sexual Activity  . Alcohol use: No  . Drug use: No  . Sexual activity: Not on file  Lifestyle  . Physical activity:    Days per week: Not on file    Minutes per session: Not on file  . Stress: Not on file  Relationships  . Social connections:    Talks on phone: Not on file    Gets together: Not on file    Attends religious service: Not on file    Active member of club or organization: Not on file    Attends meetings of clubs or organizations: Not on file    Relationship status: Not on file  . Intimate partner violence:    Fear of current or ex partner: Not on file    Emotionally abused: Not on file    Physically abused: Not on file    Forced sexual activity: Not on file  Other Topics Concern  . Not on file  Social History Narrative   Lives alone.   Daughter died   Son lives nearby      No living will   Would want son as health care POA. Alternate would be sister Mardene Celeste   Would accept resuscitation attempts   Not sure  about tube feeds   Review of Systems Sleeps is still not great--mind runs Occasional mind daytime somnolence but generally good energy Needs some dental work Plans to set up with new dermatologist--breaks out with stress Bowels are okay---no blood. Not as constipated No dysuria, hematuria  or incontinence No heartburn or dysphagia No sig back or joint pains    Objective:   Physical Exam  Constitutional: She is oriented to person, place, and time. She appears well-developed. No distress.  Neck: No thyromegaly present.  Cardiovascular: Normal rate, regular rhythm, normal heart sounds and intact distal pulses. Exam reveals no gallop.  No murmur heard. Respiratory: Effort normal and breath sounds normal. No respiratory distress. She has no wheezes. She has no rales.  GI: Soft. There is no abdominal tenderness.  Musculoskeletal:        General: No tenderness or edema.  Lymphadenopathy:    She has no cervical adenopathy.  Neurological: She is alert and oriented to person, place, and time.  President--- "Milinda Pointer, Obama, ?" [phone] [id number] Recall 3/3  Skin: No rash noted. No erythema.  Psychiatric: She has a normal mood and affect. Her behavior is normal.           Assessment & Plan:

## 2018-04-01 NOTE — Assessment & Plan Note (Signed)
Chronic.  Follows with oncology. 

## 2018-04-01 NOTE — Assessment & Plan Note (Signed)
Ongoing symptoms but no sig pain

## 2018-04-01 NOTE — Assessment & Plan Note (Signed)
GFR ~55 No action for now BP Readings from Last 3 Encounters:  04/01/18 100/64  02/12/18 102/66  12/28/17 102/67

## 2018-04-01 NOTE — Assessment & Plan Note (Signed)
Several years out and just on surveillance now

## 2018-04-01 NOTE — Assessment & Plan Note (Signed)
No evidence of mass effect or metabolic issues

## 2018-04-01 NOTE — Assessment & Plan Note (Signed)
I have personally reviewed the Medicare Annual Wellness questionnaire and have noted 1. The patient's medical and social history 2. Their use of alcohol, tobacco or illicit drugs 3. Their current medications and supplements 4. The patient's functional ability including ADL's, fall risks, home safety risks and hearing or visual             impairment. 5. Diet and physical activities 6. Evidence for depression or mood disorders  The patients weight, height, BMI and visual acuity have been recorded in the chart I have made referrals, counseling and provided education to the patient based review of the above and I have provided the pt with a written personalized care plan for preventive services.  I have provided you with a copy of your personalized plan for preventive services. Please take the time to review along with your updated medication list.  Will consider shingrix Yearly flu vaccine Bilateral mastectomies Colon will be due again soon---surveillance

## 2018-04-01 NOTE — Assessment & Plan Note (Signed)
See social history 

## 2018-04-01 NOTE — Assessment & Plan Note (Signed)
Mild trouble with bruising--no sig bleeding

## 2018-04-09 ENCOUNTER — Other Ambulatory Visit: Payer: Self-pay

## 2018-04-11 ENCOUNTER — Other Ambulatory Visit: Payer: Self-pay

## 2018-04-12 ENCOUNTER — Inpatient Hospital Stay: Payer: Medicare Other

## 2018-04-12 ENCOUNTER — Telehealth: Payer: Self-pay

## 2018-04-12 NOTE — Telephone Encounter (Signed)
Nutrition Follow-up:  Patient with history of breast cancer 2002 and right colon cancer 2014.  Noted per MD following PET in 09/11/17 no recurrent disease.  Called patient for nutrition follow-up vs face to face clinic visit due to COVID-19 pandemic.  Patient reports that her appetite is good.  Has been eating 3 meals per day and 2 snacks between meals.  Reports sometimes drinking ensure shakes but not everyday.  Patient reports yesterday ate eggs, bacon for breakfast and ate spaghetti and garlic toast for lunch and hamburger for dinner last night.  Reports snacks on yogurt with walnuts, peanut butter crackers, chocolate candy.    Patient reports that she feels good, having trouble sleeping at night but otherwise feels well.    Medications: reviewed  Labs: glucose 116, creatinine 1.03  Anthropometrics:   Weight noted on 03/29/2018 per nursing note 115  Lb 6 oz.  Noted on 3/12 at another MD office weight of 112 lb.    Weight last checked by RD 111 lb 7 oz on 1/16  NUTRITION DIAGNOSIS: Unintentional weight loss improved   INTERVENTION:  Congratulated patient on weight gain.   Encouraged patient to continue choosing high calorie, high protein foods.  Patient declines further follow up at this time. She will reach out to RD as needed.  Has contact information    MONITORING, EVALUATION, GOAL: weight trends, intake   NEXT VISIT: no follow-up at this time  Danne Scardina B. [name], [place], [place] Registered Dietitian [phone] (pager)

## 2018-06-16 ENCOUNTER — Telehealth: Payer: Self-pay | Admitting: Internal Medicine

## 2018-06-16 NOTE — Telephone Encounter (Signed)
Best number [phone]  Pt has questions about her bill on 04/01/2018  She was billed 275 insurance pd Then billed 260 insurance pd all but $20   She wanted to know why she had 2 charges.  She called her insurance company and they stated all they go by is the codes and she would have to call the office.

## 2018-06-17 NOTE — Telephone Encounter (Signed)
Dawn, can you look at this. I see the [id number] was corrected but I'm not sure what that means. Can you help me figure out what to tell the patient?

## 2018-06-17 NOTE — Telephone Encounter (Signed)
I left msg for pt to return my call.

## 2018-06-17 NOTE — Telephone Encounter (Signed)
Hi Lynn, Her chronic issues were discussed at the visit was the reason for additional charge.    Thanks  [employer]

## 2018-06-21 NOTE — Telephone Encounter (Signed)
I spoke with patient and explained Dawn's response. Pt was pleasant but still did not understand why this is not covered by wellness. I attempted to explain this is for preventative, not chronic but pt was not receptive but was appreciative about the call back.

## 2018-06-28 ENCOUNTER — Other Ambulatory Visit: Payer: Self-pay

## 2018-06-28 ENCOUNTER — Inpatient Hospital Stay: Payer: Medicare Other | Attending: Hematology and Oncology

## 2018-06-28 DIAGNOSIS — E46 Unspecified protein-calorie malnutrition: Secondary | ICD-10-CM | POA: Insufficient documentation

## 2018-06-28 DIAGNOSIS — Z9221 Personal history of antineoplastic chemotherapy: Secondary | ICD-10-CM | POA: Insufficient documentation

## 2018-06-28 DIAGNOSIS — Z853 Personal history of malignant neoplasm of breast: Secondary | ICD-10-CM

## 2018-06-28 DIAGNOSIS — C182 Malignant neoplasm of ascending colon: Secondary | ICD-10-CM

## 2018-06-28 DIAGNOSIS — Z79899 Other long term (current) drug therapy: Secondary | ICD-10-CM | POA: Insufficient documentation

## 2018-06-28 DIAGNOSIS — Z923 Personal history of irradiation: Secondary | ICD-10-CM | POA: Diagnosis not present

## 2018-06-28 DIAGNOSIS — R634 Abnormal weight loss: Secondary | ICD-10-CM | POA: Diagnosis present

## 2018-06-28 DIAGNOSIS — Z888 Allergy status to other drugs, medicaments and biological substances status: Secondary | ICD-10-CM | POA: Diagnosis not present

## 2018-06-28 DIAGNOSIS — Z800 Family history of malignant neoplasm of digestive organs: Secondary | ICD-10-CM | POA: Diagnosis not present

## 2018-06-28 DIAGNOSIS — Z803 Family history of malignant neoplasm of breast: Secondary | ICD-10-CM | POA: Insufficient documentation

## 2018-06-28 DIAGNOSIS — Z85038 Personal history of other malignant neoplasm of large intestine: Secondary | ICD-10-CM | POA: Diagnosis not present

## 2018-06-28 DIAGNOSIS — Z9012 Acquired absence of left breast and nipple: Secondary | ICD-10-CM | POA: Insufficient documentation

## 2018-06-28 DIAGNOSIS — D72819 Decreased white blood cell count, unspecified: Secondary | ICD-10-CM | POA: Diagnosis not present

## 2018-06-28 DIAGNOSIS — Z885 Allergy status to narcotic agent status: Secondary | ICD-10-CM | POA: Diagnosis not present

## 2018-06-28 DIAGNOSIS — Z8249 Family history of ischemic heart disease and other diseases of the circulatory system: Secondary | ICD-10-CM | POA: Insufficient documentation

## 2018-06-28 DIAGNOSIS — D696 Thrombocytopenia, unspecified: Secondary | ICD-10-CM | POA: Insufficient documentation

## 2018-06-28 DIAGNOSIS — K5900 Constipation, unspecified: Secondary | ICD-10-CM | POA: Diagnosis not present

## 2018-06-28 DIAGNOSIS — I890 Lymphedema, not elsewhere classified: Secondary | ICD-10-CM | POA: Diagnosis not present

## 2018-06-28 DIAGNOSIS — Z8042 Family history of malignant neoplasm of prostate: Secondary | ICD-10-CM | POA: Diagnosis not present

## 2018-06-28 DIAGNOSIS — M1990 Unspecified osteoarthritis, unspecified site: Secondary | ICD-10-CM | POA: Insufficient documentation

## 2018-06-28 DIAGNOSIS — Z87891 Personal history of nicotine dependence: Secondary | ICD-10-CM | POA: Insufficient documentation

## 2018-06-28 DIAGNOSIS — Z881 Allergy status to other antibiotic agents status: Secondary | ICD-10-CM | POA: Insufficient documentation

## 2018-06-28 DIAGNOSIS — D61818 Other pancytopenia: Secondary | ICD-10-CM | POA: Insufficient documentation

## 2018-06-28 DIAGNOSIS — Z820 Family history of epilepsy and other diseases of the nervous system: Secondary | ICD-10-CM | POA: Insufficient documentation

## 2018-06-28 DIAGNOSIS — Z886 Allergy status to analgesic agent status: Secondary | ICD-10-CM | POA: Diagnosis not present

## 2018-06-28 DIAGNOSIS — Z882 Allergy status to sulfonamides status: Secondary | ICD-10-CM | POA: Diagnosis not present

## 2018-06-28 LAB — CBC WITH DIFFERENTIAL/PLATELET
Abs Immature Granulocytes: 0 10*3/uL (ref 0.00–0.07)
Basophils Absolute: 0 10*3/uL (ref 0.0–0.1)
Basophils Relative: 1 %
Eosinophils Absolute: 0.1 10*3/uL (ref 0.0–0.5)
Eosinophils Relative: 2 %
HCT: 38.9 % (ref 36.0–46.0)
Hemoglobin: 13.1 g/dL (ref 12.0–15.0)
Immature Granulocytes: 0 %
Lymphocytes Relative: 36 %
Lymphs Abs: 1 10*3/uL (ref 0.7–4.0)
MCH: 33.3 pg (ref 26.0–34.0)
MCHC: 33.7 g/dL (ref 30.0–36.0)
MCV: 99 fL (ref 80.0–100.0)
Monocytes Absolute: 0.2 10*3/uL (ref 0.1–1.0)
Monocytes Relative: 7 %
Neutro Abs: 1.5 10*3/uL — ABNORMAL LOW (ref 1.7–7.7)
Neutrophils Relative %: 54 %
Platelets: 134 10*3/uL — ABNORMAL LOW (ref 150–400)
RBC: 3.93 MIL/uL (ref 3.87–5.11)
RDW: 12.5 % (ref 11.5–15.5)
WBC: 2.8 10*3/uL — ABNORMAL LOW (ref 4.0–10.5)
nRBC: 0 % (ref 0.0–0.2)

## 2018-06-28 LAB — COMPREHENSIVE METABOLIC PANEL
ALT: 11 U/L (ref 0–44)
AST: 24 U/L (ref 15–41)
Albumin: 3.7 g/dL (ref 3.5–5.0)
Alkaline Phosphatase: 67 U/L (ref 38–126)
Anion gap: 8 (ref 5–15)
BUN: 15 mg/dL (ref 8–23)
CO2: 27 mmol/L (ref 22–32)
Calcium: 8.6 mg/dL — ABNORMAL LOW (ref 8.9–10.3)
Chloride: 102 mmol/L (ref 98–111)
Creatinine, Ser: 0.86 mg/dL (ref 0.44–1.00)
GFR calc Af Amer: >60 mL/min (ref >60)
GFR calc non Af Amer: >60 mL/min (ref >60)
Glucose, Bld: 140 mg/dL — ABNORMAL HIGH (ref 70–99)
Potassium: 4 mmol/L (ref 3.5–5.1)
Sodium: 137 mmol/L (ref 135–145)
Total Bilirubin: 0.7 mg/dL (ref 0.3–1.2)
Total Protein: 7.4 g/dL (ref 6.5–8.1)

## 2018-06-28 NOTE — Progress Notes (Signed)
[facility]  [address] [place], [place] [address] Phone: [phone]  Fax: [phone]   Telemedicine Office Visit:  06/29/2018  Referring physician: Venia Carbon, MD  I connected with Amy Bird on 06/29/2018 at 12:34 PM by videoconferencing and verified that I was speaking with the correct person using 2 identifiers.  The patient was at home.  I discussed the limitations, risk, security and privacy concerns of performing an evaluation and management service by videoconferencing and the availability of in person appointments.  I also discussed with the patient that there may be a patient responsible charge related to this service.  The patient expressed understanding and agreed to proceed.   Chief Complaint: Amy Bird is a 68 y.o. female with a history of left breast cancer (2002), right sided colon cancer (2014), mild pancytopenia, and unexplained weight loss who is seen for 6 month assessment.  HPI: The patient was last seen in the oncology clinic on 12/28/2017. At that time, she felt good.  She was eating well and had gained 1 pound.  Exam was stable. CA27.29 was 45.7. CEA was 6.7.  She met with Jennet Maduro, RD on 12/31/2017, 02/04/2018, and 04/12/2018 for unintentional weight loss. She continued drinking 1 Ensure daily.   She was seen for annual follow-up with his PCP Dr. Silvio Pate on 04/01/2018.   Labs on 06/28/2018: WBC 2,800 (ANC 1,500), hemoglobin 13.1, hematocrit 38.9, platelets 134,000. Calcium 8.6 (corrected 9.46). CEA 7.7.  CA27.29 52.2.  During the interim, the patient reports "doing okay." She reports gaining weight since her last appointment. She is eating well and has increased energy level. She is having cramps on her left side. She denies breast concerns or recent infections. Her bowel movements have improved, and she reports occasional constipation.   She stopped oral B12 x1 month ago.    Past Medical History:  Diagnosis  Date  . Anemia   . Arthritis   . Breast cancer, left breast ([place]) 2002   Left Mastectomy, chemo + Rad tx's.  . Colon cancer ([place]) 10/2012   Partial colon resection and chemo tx's.  . Diverticulitis   . Glaucoma   . Hearing loss   . Hyperlipidemia 20 years  . Kidney stone   . Lymphedema of left arm   . Neuromuscular disorder ([place])    neuropathy  feet and hands from chemo tx  . Pneumonia   . Shingles   . Vertigo    last episode around Jan 2018    Past Surgical History:  Procedure Laterality Date  . ABDOMINAL HYSTERECTOMY  1973  . APPENDECTOMY  1969  . CATARACT EXTRACTION W/PHACO Left 07/11/2015   Procedure: CATARACT EXTRACTION PHACO AND INTRAOCULAR LENS PLACEMENT ([place]) left eye;  Surgeon: Leandrew Koyanagi, MD;  Location: [place];  Service: Ophthalmology;  Laterality: Left;  . CATARACT EXTRACTION W/PHACO Right 08/27/2016   Procedure: CATARACT EXTRACTION PHACO AND INTRAOCULAR LENS PLACEMENT ([facility]);  Surgeon: Leandrew Koyanagi, MD;  Location: [place];  Service: Ophthalmology;  Laterality: Right;  IVA TOPICAL RIGHT  . CESAREAN SECTION    . COLON RESECTION    . COLONOSCOPY  2004  . COLONOSCOPY N/A 04/04/2016   Procedure: COLONOSCOPY;  Surgeon: Lollie Sails, MD;  Location: [facility] ENDOSCOPY;  Service: Endoscopy;  Laterality: N/A;  . ESOPHAGOGASTRODUODENOSCOPY N/A 04/04/2016   Procedure: ESOPHAGOGASTRODUODENOSCOPY (EGD);  Surgeon: Lollie Sails, MD;  Location: [facility] ENDOSCOPY;  Service: Endoscopy;  Laterality: N/A;  . kidney stone removal     .  MASTECTOMY Bilateral 2002,2010  . salpingo oophorectmy   1973    Family History  Problem Relation Age of Onset  . Hypertension Mother   . Alzheimer's disease Mother   . Cancer Father        prostate  . Prostate cancer Father   . Cancer Sister        breast  . Hypertension Sister   . Cancer Brother        stomach  . Hypertension Brother   . Sarcoidosis Other        positive family history.  . Cancer  Sister        breast  . Cancer Sister        breast    Social History:  reports that she quit smoking about 18 years ago. Her smoking use included cigarettes. She has a 52.50 pack-year smoking history. She has never used smokeless tobacco. She reports that she does not drink alcohol or use drugs. 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  She quit smoking in 2003.  She lives in [place].  She belongs to the [employer].  The patient is alone today.  Participants in the patient's visit and their role in the encounter included the patient, and Waymon Budge, RN  today.  The intake visit was provided by Waymon Budge, RN.  Allergies:  Allergies  Allergen Reactions  . Hydrocodone-Acetaminophen Other (See Comments) and Shortness Of Breath    Not witnessed info gathered as part of patient screening.  PT states her chest felt very heavy and difficulty breathing.  . Sulfa Antibiotics Swelling  . Cefuroxime Diarrhea  . Hydrocodone-Ibuprofen Other (See Comments)    Patient feels like there is a heavy wight on her chest  . Levofloxacin Hives  . Statins Other (See Comments)    Leg cramps and groin pain  . Other Itching    Tree Nuts  . Tape Rash  . Tree Extract Itching    Current Medications: Current Outpatient Medications  Medication Sig Dispense Refill  . Calcium Carb-Cholecalciferol (CALCIUM 600 + D PO) Take 1 tablet by mouth daily.     . cholecalciferol (VITAMIN D3) 25 MCG (1000 UT) tablet Take 1,000 Units by mouth daily.    Marland Kitchen latanoprost (XALATAN) 0.005 % ophthalmic solution Place 1 drop into both eyes at bedtime.    . vitamin C (ASCORBIC ACID) 500 MG tablet Take 500 mg by mouth daily.    . vitamin B-12 (CYANOCOBALAMIN) 1000 MCG tablet Take 1,000 mcg by mouth daily.     No current facility-administered medications for this visit.     Review of Systems  Constitutional: Positive for weight loss (gaining weight). Negative for chills, diaphoresis, fever and  malaise/fatigue.       Feeling "good." Appetite and taste improved  HENT: Negative for congestion, sinus pain and sore throat.   Eyes: Negative for blurred vision.  Respiratory: Negative for cough, shortness of breath and wheezing.   Cardiovascular: Negative for chest pain, palpitations, orthopnea, leg swelling and PND.  Gastrointestinal: Positive for constipation (occasional). Negative for abdominal pain, blood in stool, diarrhea, heartburn, melena, nausea and vomiting.  Genitourinary: Negative for dysuria, frequency, hematuria and urgency.  Musculoskeletal: Positive for myalgias (left side cramps). Negative for back pain and joint pain.  Skin: Negative for rash.  Neurological: Negative for dizziness, tingling, sensory change, weakness and headaches.  Endo/Heme/Allergies: Does not bruise/bleed easily.  Psychiatric/Behavioral: Negative for depression and memory loss. The patient is not nervous/anxious and does not  have insomnia.     Performance status (ECOG):  1  Physical Exam  Constitutional: She is oriented to person, place, and time. She appears well-developed and well-nourished. No distress.  HENT:  Head: Normocephalic and atraumatic.  Black hair.   Eyes: Conjunctivae and EOM are normal. No scleral icterus.  Glasses.  Brown eyes.  Pulmonary/Chest: Effort normal and breath sounds normal.  Musculoskeletal: Normal range of motion.     Comments: Left upper extremity lymphedema sleeve.  Neurological: She is alert and oriented to person, place, and time.  Skin: She is not diaphoretic.  Psychiatric: She has a normal mood and affect. Her behavior is normal. Judgment and thought content normal.  Nursing note reviewed.    Appointment on 06/28/2018  Component Date Value Ref Range Status  . CA 27.29 06/28/2018 52.2* 0.0 - 38.6 U/mL Final   Comment: (NOTE) Siemens Centaur Immunochemiluminometric Methodology [facility]) Values obtained with different assay methods or kits cannot be used  interchangeably. Results cannot be interpreted as absolute evidence of the presence or absence of malignant disease. Performed At: [facility] [place], [place] [id number] Rush Farmer MD [id number]   . CEA 06/28/2018 7.7* 0.0 - 4.7 ng/mL Final   Comment: (NOTE)                             Nonsmokers          <3.9                             Smokers             <5.6 Roche Diagnostics Electrochemiluminescence Immunoassay (ECLIA) Values obtained with different assay methods or kits cannot be used interchangeably.  Results cannot be interpreted as absolute evidence of the presence or absence of malignant disease. Performed At: [facility] [place], [place] [id number] Rush Farmer MD [id number]   . Sodium 06/28/2018 137  135 - 145 mmol/L Final  . Potassium 06/28/2018 4.0  3.5 - 5.1 mmol/L Final  . Chloride 06/28/2018 102  98 - 111 mmol/L Final  . CO2 06/28/2018 27  22 - 32 mmol/L Final  . Glucose, Bld 06/28/2018 140* 70 - 99 mg/dL Final  . BUN 06/28/2018 15  8 - 23 mg/dL Final  . Creatinine, Ser 06/28/2018 0.86  0.44 - 1.00 mg/dL Final  . Calcium 06/28/2018 8.6* 8.9 - 10.3 mg/dL Final  . Total Protein 06/28/2018 7.4  6.5 - 8.1 g/dL Final  . Albumin 06/28/2018 3.7  3.5 - 5.0 g/dL Final  . AST 06/28/2018 24  15 - 41 U/L Final  . ALT 06/28/2018 11  0 - 44 U/L Final  . Alkaline Phosphatase 06/28/2018 67  38 - 126 U/L Final  . Total Bilirubin 06/28/2018 0.7  0.3 - 1.2 mg/dL Final  . GFR calc non Af Amer 06/28/2018 >60  >60 mL/min Final  . GFR calc Af Amer 06/28/2018 >60  >60 mL/min Final  . Anion gap 06/28/2018 8  5 - 15 Final   Performed at [facility], [address], [place], [place] [address]  . WBC 06/28/2018 2.8* 4.0 - 10.5 K/uL Final  . RBC 06/28/2018 3.93  3.87 - 5.11 MIL/uL Final  . Hemoglobin 06/28/2018 13.1  12.0 - 15.0 g/dL Final  . HCT 06/28/2018 38.9  36.0 - 46.0 % Final  . MCV 06/28/2018 99.0  80.0  -  100.0 fL Final  . MCH 06/28/2018 33.3  26.0 - 34.0 pg Final  . MCHC 06/28/2018 33.7  30.0 - 36.0 g/dL Final  . RDW 06/28/2018 12.5  11.5 - 15.5 % Final  . Platelets 06/28/2018 134* 150 - 400 K/uL Final  . nRBC 06/28/2018 0.0  0.0 - 0.2 % Final  . Neutrophils Relative % 06/28/2018 54  % Final  . Neutro Abs 06/28/2018 1.5* 1.7 - 7.7 K/uL Final  . Lymphocytes Relative 06/28/2018 36  % Final  . Lymphs Abs 06/28/2018 1.0  0.7 - 4.0 K/uL Final  . Monocytes Relative 06/28/2018 7  % Final  . Monocytes Absolute 06/28/2018 0.2  0.1 - 1.0 K/uL Final  . Eosinophils Relative 06/28/2018 2  % Final  . Eosinophils Absolute 06/28/2018 0.1  0.0 - 0.5 K/uL Final  . Basophils Relative 06/28/2018 1  % Final  . Basophils Absolute 06/28/2018 0.0  0.0 - 0.1 K/uL Final  . Immature Granulocytes 06/28/2018 0  % Final  . Abs Immature Granulocytes 06/28/2018 0.00  0.00 - 0.07 K/uL Final   Performed at [facility], [address], [place], [place] [address]    Assessment:  Amy Bird is a 68 y.o. female with a history of left breast cancer (2002), right sided stage III colon cancer (2014), and mild pancytopenia.  She was diagnosed with left breast cancer in 2002.  She underwent left mastectomy in 03/2000 (no pathology available).  She received Adriamycin and Cytoxan [facility]) which completed in 07/2000 followed by Herceptin and Taxotere in 08/2000.  She did not receive hormonal therapy.  She underwent right sided mastectomy in 02/2008.  CA27.29 was 40.9 on 06/06/2010, 42.6 on 02/04/2011, 35.3 on 03/31/2012, 38.1 on 11/09/2012, 52.3 on 09/17/2015, 50.5 on 10/01/2015, 47.8 on 02/11/2016, 36.1 on 06/20/2016, 36.6 on 10/20/2016, 42.6 on 02/23/2017, 44.6 on 03/26/2017, 37.7 on 05/22/2017, 47.1 on 08/25/2017, 45.7 on 12/28/2017, and 52.2 on 06/28/2018.  She has intermittent anemia, neutropenia, and thrombocytopenia since 10/2012.  Bone marrow aspiration and biopsy in 10/2012 was negative for any  myelodysplastic syndrome.  Bone marrow revealed no iron storage.  CBC on 09/17/2015 revealed a hematocrit 39.6, hemoglobin 13.3, MCV 96, platelets 128,000, white count 3000 with ANC of 1700. B12 was 266 (low normal) with a normal MMA on 09/19/2015.  Normal studies included:  folate, TSH, free T4.  Ferritin was 20.    She was diagnosed with colon cancer in 12/2012.  She underwent right hemicolectomy on 12/24/2012.  Pathology revealed a 3.7 cm grade II adenocarcinoma which extended through the muscularis propria and into the adjacent adipose.  One of 27 lymph nodes were positive.  Pathologic stage III ([id number]).    She was treated on CALGB protocol with FOLFOX chemotherapy +/- celecoxib.  She withdrew from the study in 02/25/2013.  She described trouble with counts.  She received 6 cycles of FOLFOX (01/28/2013 - 04/29/2013).  Colonoscopy on 04/04/2016 revealed a 2 mm polyp in the distal sigmoid colon.  Pathology revealed a serrated polyp.  EGD on 04/04/2016 revealed LA grade A esophagitis, non-bleeding erosive gastropathy, gastritis, and duodenitis.  Gastric and GEJ pathology revealed moderate chronic active inflammation, H pylori associated.  CEA was 3.8 on 12/09/2012, 6.3 on 01/27/2013, 10.6 on 05/26/2013, 5.9 on 09/22/2013, 7.1 on 05/10/2014, 7.0 on 11/08/2014, 5.8 on 05/08/2015, 7.2 on 09/17/2015, 5.9 on 10/01/2015, 6.5 on 02/11/2016, 6.9 on 10/20/2016, 7.6 on 02/23/2017, 7.3 on 08/25/2017, 6.7 on 12/28/2017, and 7.7 on 06/28/2018.  PET scan on 10/09/2015  revealed no evidence of recurrent disease.  Chest, abdomen, and pelvic CT scan on 02/15/2016 revealed no findings of recurrent malignancy.  There were radiation therapy related opacities in the left lung.  There was prominent stool throughout the colon.  There was lumbar degenerative disc disease.  PET scan on 09/11/2017 revealed no hypermetabolic residual or recurrent disease.  She notes gradual weight loss since discontinuation of an  anti-depressant years ago.  She previously weighed 110 pounds. TSH and free T4 were normal on 02/11/2016.  Labs on 02/26/2016 revealed a normal cortisol, sed rate, and ANA.  HIV testing was negative.  TSH and free T4 were normal on 08/27/2017.  Left lower extremity duplex on 09/19/2015 revealed no evidence of DVT.  She had a left popliteal fossa Baker's cyst.  She has been diagnosed with H pylori (stool antigen + 05/17/2016) and treated x 3.  She has B12 deficiency.  B12 was 251 on 08/27/2017 and 760 on 09/24/2017.  Folate was 12.  She began oral B12 on 08/28/2017.  She has been off B12 x 1 month.  Symptomatically, she is doing well.  She is gaining weight.  She denies any interval infections.  Plan: 1.   Review labs from 06/28/2018. 2.   Breast and colon cancer Clinically doing well. Patient denies any concerning symptoms. Discuss persistent elevation of tumor markers.             Last imaging in 08/2017 revealed no evidence of disease.             Discuss reimaging if markers increase by > 10-15%.  Check CEA and CA27.29 in 1 month. 3.   Mild leukopenia and thrombocytopenia             WBC 2800.  ANC 1500.  Platelets 134,000.             Patient has been off oral B12 x 1 month.   Patient to restart B12.  Check flow cytometry. Review plan for bone marrow if counts decline (r/o MDS). 4.   Lymphedema Continue lymphedema sleeve. 5.   Protein calorie malnutrition Patient notes gaining weight. No concerns per patient. Continue to monitor. 6.   MD or RN to call patient with lab results in 1 month.  If markers still rising, will schedule scans. 7.   RTC in 6 months for MD assessment, labs (CBC with diff, CMP, CEA, CA27.29).   I discussed the assessment and treatment plan with the patient.  The patient was provided an opportunity to ask questions and all were answered.  The patient agreed with the plan and demonstrated an understanding of the instructions.  The patient was advised to call  back or seek an in person evaluation if the symptoms worsen or if the condition fails to improve as anticipated.  I provided 16 minutes (12:34 PM - 12:50 PM) of face-to-face video visit time during this this encounter and > 50% was spent counseling as documented under my assessment and plan.  I provided these services from the [facility] office.   Nolon Stalls, MD, PhD  06/29/2018, 12:34 PM  I, Molly Dorshimer, am acting as Education administrator for [employer]. Mike Gip, MD, PhD.  I, Melissa C. Mike Gip, MD, have reviewed the above documentation for accuracy and completeness, and I agree with the above.

## 2018-06-29 ENCOUNTER — Other Ambulatory Visit: Payer: Medicare Other

## 2018-06-29 ENCOUNTER — Encounter: Payer: Self-pay | Admitting: Hematology and Oncology

## 2018-06-29 ENCOUNTER — Ambulatory Visit: Payer: Medicare Other | Admitting: Hematology and Oncology

## 2018-06-29 ENCOUNTER — Inpatient Hospital Stay (HOSPITAL_BASED_OUTPATIENT_CLINIC_OR_DEPARTMENT_OTHER): Payer: Medicare Other | Admitting: Hematology and Oncology

## 2018-06-29 ENCOUNTER — Other Ambulatory Visit: Payer: Medicare Other | Admitting: Hematology and Oncology

## 2018-06-29 DIAGNOSIS — Z820 Family history of epilepsy and other diseases of the nervous system: Secondary | ICD-10-CM

## 2018-06-29 DIAGNOSIS — R634 Abnormal weight loss: Secondary | ICD-10-CM

## 2018-06-29 DIAGNOSIS — E46 Unspecified protein-calorie malnutrition: Secondary | ICD-10-CM

## 2018-06-29 DIAGNOSIS — Z8042 Family history of malignant neoplasm of prostate: Secondary | ICD-10-CM

## 2018-06-29 DIAGNOSIS — Z800 Family history of malignant neoplasm of digestive organs: Secondary | ICD-10-CM

## 2018-06-29 DIAGNOSIS — Z888 Allergy status to other drugs, medicaments and biological substances status: Secondary | ICD-10-CM

## 2018-06-29 DIAGNOSIS — Z87891 Personal history of nicotine dependence: Secondary | ICD-10-CM

## 2018-06-29 DIAGNOSIS — K5900 Constipation, unspecified: Secondary | ICD-10-CM

## 2018-06-29 DIAGNOSIS — D61818 Other pancytopenia: Secondary | ICD-10-CM

## 2018-06-29 DIAGNOSIS — C182 Malignant neoplasm of ascending colon: Secondary | ICD-10-CM

## 2018-06-29 DIAGNOSIS — Z9012 Acquired absence of left breast and nipple: Secondary | ICD-10-CM

## 2018-06-29 DIAGNOSIS — Z79899 Other long term (current) drug therapy: Secondary | ICD-10-CM

## 2018-06-29 DIAGNOSIS — Z886 Allergy status to analgesic agent status: Secondary | ICD-10-CM

## 2018-06-29 DIAGNOSIS — Z853 Personal history of malignant neoplasm of breast: Secondary | ICD-10-CM

## 2018-06-29 DIAGNOSIS — Z923 Personal history of irradiation: Secondary | ICD-10-CM

## 2018-06-29 DIAGNOSIS — Z881 Allergy status to other antibiotic agents status: Secondary | ICD-10-CM

## 2018-06-29 DIAGNOSIS — Z803 Family history of malignant neoplasm of breast: Secondary | ICD-10-CM

## 2018-06-29 DIAGNOSIS — D72819 Decreased white blood cell count, unspecified: Secondary | ICD-10-CM

## 2018-06-29 DIAGNOSIS — Z85038 Personal history of other malignant neoplasm of large intestine: Secondary | ICD-10-CM | POA: Diagnosis not present

## 2018-06-29 DIAGNOSIS — Z885 Allergy status to narcotic agent status: Secondary | ICD-10-CM

## 2018-06-29 DIAGNOSIS — M1990 Unspecified osteoarthritis, unspecified site: Secondary | ICD-10-CM

## 2018-06-29 DIAGNOSIS — Z882 Allergy status to sulfonamides status: Secondary | ICD-10-CM

## 2018-06-29 DIAGNOSIS — Z8249 Family history of ischemic heart disease and other diseases of the circulatory system: Secondary | ICD-10-CM

## 2018-06-29 DIAGNOSIS — D696 Thrombocytopenia, unspecified: Secondary | ICD-10-CM

## 2018-06-29 DIAGNOSIS — E538 Deficiency of other specified B group vitamins: Secondary | ICD-10-CM

## 2018-06-29 DIAGNOSIS — R978 Other abnormal tumor markers: Secondary | ICD-10-CM

## 2018-06-29 DIAGNOSIS — I890 Lymphedema, not elsewhere classified: Secondary | ICD-10-CM

## 2018-06-29 DIAGNOSIS — Z9221 Personal history of antineoplastic chemotherapy: Secondary | ICD-10-CM

## 2018-06-29 LAB — CANCER ANTIGEN 27.29: CA 27.29: 52.2 U/mL — ABNORMAL HIGH (ref 0.0–38.6)

## 2018-06-29 LAB — CEA: CEA: 7.7 ng/mL — ABNORMAL HIGH (ref 0.0–4.7)

## 2018-06-29 NOTE — Progress Notes (Signed)
Confirmed Name, DOB, and Address. Reports some pain left side of chest, occasionally. Reports it feels like muscle pain. Occurring for 2-3 months. No pain currently. PCP aware.

## 2018-07-07 ENCOUNTER — Telehealth: Payer: Self-pay | Admitting: Internal Medicine

## 2018-07-07 DIAGNOSIS — Z13220 Encounter for screening for lipoid disorders: Secondary | ICD-10-CM

## 2018-07-07 NOTE — Telephone Encounter (Signed)
I put in a future order for [facility] clinical labs---not sure if that is the right thing to do

## 2018-07-07 NOTE — Telephone Encounter (Signed)
Patient called and stated she would like her cholesterol checked when she is at the cancer center in [place]. They advised patient to call our office to have the lab orders sent over to the so they can draw those at her next visit   Patient's c/b # [id number]

## 2018-07-07 NOTE — Telephone Encounter (Signed)
Spoke to pt. She will let them know at her appt.

## 2018-07-07 NOTE — Telephone Encounter (Signed)
Spoke to pt. She would still like for Dr Silvio Pate to put an order in the system and they will draw it at her OV in July at the cancer center.

## 2018-07-07 NOTE — Telephone Encounter (Signed)
I am really not concerned about her cholesterol--and wouldn't treat it if it was high. If she still insists, you can put in an order for lipid panel--but I am not sure where to route it for draw scheduled at the cancer center

## 2018-07-19 ENCOUNTER — Other Ambulatory Visit: Payer: Self-pay

## 2018-07-19 DIAGNOSIS — D696 Thrombocytopenia, unspecified: Secondary | ICD-10-CM

## 2018-07-19 DIAGNOSIS — D709 Neutropenia, unspecified: Secondary | ICD-10-CM

## 2018-07-22 ENCOUNTER — Other Ambulatory Visit: Payer: Self-pay

## 2018-07-26 ENCOUNTER — Inpatient Hospital Stay: Payer: Medicare Other | Attending: Hematology and Oncology

## 2018-07-26 ENCOUNTER — Other Ambulatory Visit: Payer: Self-pay

## 2018-07-26 DIAGNOSIS — D72819 Decreased white blood cell count, unspecified: Secondary | ICD-10-CM

## 2018-07-26 DIAGNOSIS — Z8042 Family history of malignant neoplasm of prostate: Secondary | ICD-10-CM | POA: Insufficient documentation

## 2018-07-26 DIAGNOSIS — D61818 Other pancytopenia: Secondary | ICD-10-CM | POA: Insufficient documentation

## 2018-07-26 DIAGNOSIS — D696 Thrombocytopenia, unspecified: Secondary | ICD-10-CM | POA: Diagnosis not present

## 2018-07-26 DIAGNOSIS — Z820 Family history of epilepsy and other diseases of the nervous system: Secondary | ICD-10-CM | POA: Diagnosis not present

## 2018-07-26 DIAGNOSIS — R978 Other abnormal tumor markers: Secondary | ICD-10-CM

## 2018-07-26 DIAGNOSIS — Z87891 Personal history of nicotine dependence: Secondary | ICD-10-CM | POA: Diagnosis not present

## 2018-07-26 DIAGNOSIS — Z800 Family history of malignant neoplasm of digestive organs: Secondary | ICD-10-CM | POA: Insufficient documentation

## 2018-07-26 DIAGNOSIS — M791 Myalgia, unspecified site: Secondary | ICD-10-CM | POA: Diagnosis not present

## 2018-07-26 DIAGNOSIS — Z803 Family history of malignant neoplasm of breast: Secondary | ICD-10-CM | POA: Insufficient documentation

## 2018-07-26 DIAGNOSIS — E46 Unspecified protein-calorie malnutrition: Secondary | ICD-10-CM | POA: Diagnosis not present

## 2018-07-26 DIAGNOSIS — Z853 Personal history of malignant neoplasm of breast: Secondary | ICD-10-CM

## 2018-07-26 DIAGNOSIS — Z9221 Personal history of antineoplastic chemotherapy: Secondary | ICD-10-CM | POA: Diagnosis not present

## 2018-07-26 DIAGNOSIS — Z923 Personal history of irradiation: Secondary | ICD-10-CM | POA: Diagnosis not present

## 2018-07-26 DIAGNOSIS — D709 Neutropenia, unspecified: Secondary | ICD-10-CM

## 2018-07-26 DIAGNOSIS — Z9012 Acquired absence of left breast and nipple: Secondary | ICD-10-CM | POA: Insufficient documentation

## 2018-07-26 DIAGNOSIS — Z8249 Family history of ischemic heart disease and other diseases of the circulatory system: Secondary | ICD-10-CM | POA: Diagnosis not present

## 2018-07-26 DIAGNOSIS — K5900 Constipation, unspecified: Secondary | ICD-10-CM | POA: Insufficient documentation

## 2018-07-26 DIAGNOSIS — E538 Deficiency of other specified B group vitamins: Secondary | ICD-10-CM

## 2018-07-26 DIAGNOSIS — Z79899 Other long term (current) drug therapy: Secondary | ICD-10-CM | POA: Diagnosis not present

## 2018-07-26 DIAGNOSIS — C182 Malignant neoplasm of ascending colon: Secondary | ICD-10-CM

## 2018-07-26 LAB — VITAMIN B12: Vitamin B-12: 356 pg/mL (ref 180–914)

## 2018-07-26 LAB — FOLATE: Folate: 14.2 ng/mL (ref >5.9)

## 2018-07-27 LAB — CANCER ANTIGEN 27.29: CA 27.29: 48.3 U/mL — ABNORMAL HIGH (ref 0.0–38.6)

## 2018-07-27 LAB — CEA: CEA: 7.3 ng/mL — ABNORMAL HIGH (ref 0.0–4.7)

## 2018-07-28 LAB — COMP PANEL: LEUKEMIA/LYMPHOMA

## 2018-08-09 ENCOUNTER — Other Ambulatory Visit: Payer: Medicare Other

## 2018-09-07 ENCOUNTER — Telehealth: Payer: Self-pay | Admitting: Gastroenterology

## 2018-09-07 NOTE — Telephone Encounter (Signed)
Patient states she was seen here & I did not see records in Epic other than phone messages. State she is due for a colonoscopy with Dr Allen Norris. Please advise.

## 2018-09-08 ENCOUNTER — Other Ambulatory Visit: Payer: Self-pay

## 2018-09-08 DIAGNOSIS — Z85038 Personal history of other malignant neoplasm of large intestine: Secondary | ICD-10-CM

## 2018-09-08 MED ORDER — NA SULFATE-K SULFATE-MG SULF 17.5-3.13-1.6 GM/177ML PO SOLN: 1.0000 | Freq: Once | ORAL | 0 refills | Status: AC

## 2018-09-08 NOTE — Telephone Encounter (Signed)
Will you contact this pt and schedule her Colonoscopy. Let her know her last colonoscopy was in 2018 with Dr. Donnella Sham and make sure she wants Dr. Allen Norris to do it. He recommended a 18 to 24 month repeat.

## 2018-09-08 NOTE — Telephone Encounter (Signed)
Gastroenterology Pre-Procedure Review  Request Date: 10/01/18 Requesting Physician: Dr. Allen Norris  PATIENT REVIEW QUESTIONS: The patient responded to the following health history questions as indicated:    1. Are you having any GI issues? yes (occasional constipation) 2. Do you have a personal history of Polyps? yes (history of colon cancer 2014,  polyps noted in ?2018 colonoscopy) 3. Do you have a family history of Colon Cancer or Polyps? yes (sister polyps, family history of colon cancer-not sure which family member) 44. Diabetes Mellitus? no 5. Joint replacements in the past 12 months?no 6. Major health problems in the past 3 months?no 7. Any artificial heart valves, MVP, or defibrillator?no    MEDICATIONS & ALLERGIES:    Patient reports the following regarding taking any anticoagulation/antiplatelet therapy:   Plavix, Coumadin, Eliquis, Xarelto, Lovenox, Pradaxa, Brilinta, or Effient? no Aspirin? no  Patient confirms/reports the following medications:  Current Outpatient Medications  Medication Sig Dispense Refill  . Calcium Carb-Cholecalciferol (CALCIUM 600 + D PO) Take 1 tablet by mouth daily.     . cholecalciferol (VITAMIN D3) 25 MCG (1000 UT) tablet Take 1,000 Units by mouth daily.    Marland Kitchen latanoprost (XALATAN) 0.005 % ophthalmic solution Place 1 drop into both eyes at bedtime.    . vitamin B-12 (CYANOCOBALAMIN) 1000 MCG tablet Take 1,000 mcg by mouth daily.    . vitamin C (ASCORBIC ACID) 500 MG tablet Take 500 mg by mouth daily.     No current facility-administered medications for this visit.     Patient confirms/reports the following allergies:  Allergies  Allergen Reactions  . Hydrocodone-Acetaminophen Other (See Comments) and Shortness Of Breath    Not witnessed info gathered as part of patient screening.  PT states her chest felt very heavy and difficulty breathing.  . Sulfa Antibiotics Swelling  . Cefuroxime Diarrhea  . Hydrocodone-Ibuprofen Other (See Comments)   Patient feels like there is a heavy wight on her chest  . Levofloxacin Hives  . Statins Other (See Comments)    Leg cramps and groin pain  . Other Itching    Tree Nuts  . Tape Rash  . Tree Extract Itching    No orders of the defined types were placed in this encounter.   AUTHORIZATION INFORMATION Primary Insurance: 1D#: Group #:  Secondary Insurance: 1D#: Group #:  SCHEDULE INFORMATION: Date: 10/01/18 Time: Location:[facility]

## 2018-09-16 ENCOUNTER — Telehealth: Payer: Self-pay

## 2018-09-16 NOTE — Telephone Encounter (Signed)
Spoke to the patient to inform her that, The patient called in and asking for a letter to return back to work.  per Dr Mike Gip after reveiwing her chart she don't think it would be a good ideal to return to work at this time. The is a high risk patient. Due to her health condition per dr Mike Gip the should reconsider returning to work at this time.

## 2018-09-23 ENCOUNTER — Other Ambulatory Visit: Payer: Self-pay

## 2018-09-23 ENCOUNTER — Telehealth: Payer: Self-pay | Admitting: Gastroenterology

## 2018-09-23 ENCOUNTER — Encounter: Payer: Self-pay | Admitting: *Deleted

## 2018-09-23 MED ORDER — NA SULFATE-K SULFATE-MG SULF 17.5-3.13-1.6 GM/177ML PO SOLN: 1.0000 | Freq: Once | ORAL | 0 refills | Status: AC

## 2018-09-23 NOTE — Telephone Encounter (Signed)
Pt left vm she has not received her prep kit that she is supposed to be drinking please call pt

## 2018-09-23 NOTE — Telephone Encounter (Signed)
Received message from pt about not receiving bowel prep. Called to inform pt that we have sent bowel prep over to the pharmacy.   Thanks,  Sharyn Lull

## 2018-09-24 NOTE — Anesthesia Preprocedure Evaluation (Addendum)
Anesthesia Evaluation  Patient identified by MRN, date of birth, ID band Patient awake    Reviewed: Allergy & Precautions, NPO status , Patient's Chart, lab work & pertinent test results  History of Anesthesia Complications Negative for: history of anesthetic complications  Airway Mallampati: III  TM Distance: >3 FB Neck ROM: Full    Dental   Pulmonary former smoker,    breath sounds clear to auscultation       Cardiovascular (-) angina(-) DOE  Rhythm:Regular Rate:Normal   HLD   Neuro/Psych  Neuromuscular disease (Peripheral neuropathy 2/2 chemo)    GI/Hepatic neg GERD  ,  Endo/Other    Renal/GU CRFRenal disease (Kidney stones)     Musculoskeletal  (+) Arthritis ,   Abdominal   Peds  Hematology  (+) anemia ,   Anesthesia Other Findings Breast cancer Colon cancer  Reproductive/Obstetrics                            Anesthesia Physical Anesthesia Plan  ASA: II  Anesthesia Plan: General   Post-op Pain Management:    Induction: Intravenous  PONV Risk Score and Plan: 2 and Propofol infusion and TIVA  Airway Management Planned: Natural Airway and Nasal Cannula  Additional Equipment:   Intra-op Plan:   Post-operative Plan:   Informed Consent: I have reviewed the patients History and Physical, chart, labs and discussed the procedure including the risks, benefits and alternatives for the proposed anesthesia with the patient or authorized representative who has indicated his/her understanding and acceptance.       Plan Discussed with: CRNA and Anesthesiologist  Anesthesia Plan Comments:        Anesthesia Quick Evaluation   Active Ambulatory Problems    Diagnosis Date Noted  . Allergic rhinitis 01/29/2005  . Absolute anemia 11/08/2014  . Peripheral neuropathy due to chemotherapy ([place]) 08/10/2014  . Neutropenia ([place]) 11/08/2014  . Cyst of pituitary gland ([place])  11/17/2014  . History of left breast cancer 09/17/2015  . Cancer of ascending colon ([place]) 09/17/2015  . Leukopenia 09/17/2015  . B12 deficiency 02/10/2016  . Lymphedema of left upper extremity 02/23/2017  . Malnutrition of mild degree ([place]) 03/19/2017  . Ankle edema, bilateral 09/16/2015  . Chronic RLQ pain 08/10/2014  . Dyspepsia 08/10/2014  . Thrombocytopenia ([place]) 08/27/2017  . Elevated tumor markers 11/15/2017  . Preventative health care 04/01/2018  . Aortic atherosclerosis ([place]) 04/01/2018  . Chronic renal disease, stage III ([place]) 04/01/2018  . Advance directive discussed with patient 04/01/2018   Resolved Ambulatory Problems    Diagnosis Date Noted  . Abdominal pain   . Shingles   . Acute bronchitis 12/30/2006  . Acute cystitis 11/08/2014  . Personal history of tobacco use, presenting hazards to health 06/07/2015  . Iron deficiency anemia 09/17/2015  . Weight loss 02/11/2016  . Hypokalemia 02/11/2016  . Helicobacter pylori gastritis 04/14/2016  . Mixed hyperlipidemia 08/10/2014  . Plantar fasciitis, bilateral 09/16/2015  . Sensory peripheral neuropathy 06/07/2015  . Left upper arm injury 07/13/2017   Past Medical History:  Diagnosis Date  . Anemia   . Arthritis   . Breast cancer, left breast ([place]) 2002  . Colon cancer ([place]) 10/2012  . Diverticulitis   . Glaucoma   . Hearing loss   . Hyperlipidemia 20 years  . Kidney stone   . Lymphedema of left arm   . Neuromuscular disorder ([place])   . Pneumonia   . Vertigo  CBC    Component Value Date/Time   WBC 2.8 (L) 06/28/2018 1020   RBC 3.93 06/28/2018 1020   HGB 13.1 06/28/2018 1020   HGB 11.8 (L) 05/10/2014 1608   HCT 38.9 06/28/2018 1020   HCT 35.6 05/10/2014 1608   PLT 134 (L) 06/28/2018 1020   PLT 134 (L) 05/10/2014 1608   MCV 99.0 06/28/2018 1020   MCV 97 05/10/2014 1608   MCH 33.3 06/28/2018 1020   MCHC 33.7 06/28/2018 1020   RDW 12.5 06/28/2018 1020   RDW 13.0 05/10/2014 1608   LYMPHSABS 1.0  06/28/2018 1020   LYMPHSABS 1.3 05/10/2014 1608   MONOABS 0.2 06/28/2018 1020   MONOABS 0.2 05/10/2014 1608   EOSABS 0.1 06/28/2018 1020   EOSABS 0.0 05/10/2014 1608   BASOSABS 0.0 06/28/2018 1020   BASOSABS 0.0 05/10/2014 1608    CMP     Component Value Date/Time   NA 137 06/28/2018 1020   NA 139 05/10/2014 1608   K 4.0 06/28/2018 1020   K 4.0 05/10/2014 1608   CL 102 06/28/2018 1020   CL 105 05/10/2014 1608   CO2 27 06/28/2018 1020   CO2 28 05/10/2014 1608   GLUCOSE 140 (H) 06/28/2018 1020   GLUCOSE 128 (H) 05/10/2014 1608   BUN 15 06/28/2018 1020   BUN 12 05/10/2014 1608   CREATININE 0.86 06/28/2018 1020   CREATININE 0.74 05/10/2014 1608   CALCIUM 8.6 (L) 06/28/2018 1020   CALCIUM 8.7 (L) 05/10/2014 1608   PROT 7.4 06/28/2018 1020   PROT 7.1 05/10/2014 1608   ALBUMIN 3.7 06/28/2018 1020   ALBUMIN 3.5 05/10/2014 1608   AST 24 06/28/2018 1020   AST 29 05/10/2014 1608   ALT 11 06/28/2018 1020   ALT 17 05/10/2014 1608   ALKPHOS 67 06/28/2018 1020   ALKPHOS 62 05/10/2014 1608   BILITOT 0.7 06/28/2018 1020   BILITOT 0.6 05/10/2014 1608   GFRNONAA >60 06/28/2018 1020   GFRNONAA >60 05/10/2014 1608   GFRAA >60 06/28/2018 1020   GFRAA >60 05/10/2014 1608    COAGS    Component Value Date/Time   INR 0.82 09/17/2015 1045   INR 1.0 04/11/2013 1655    I have seen and consented the patient, VLASTA LILLYWHITE. I have answered all of her questions regarding anesthesia. she is appropriately NPO.   Josephina Shih, MD Anesthesia

## 2018-09-28 ENCOUNTER — Other Ambulatory Visit
Admission: RE | Admit: 2018-09-28 | Discharge: 2018-09-28 | Disposition: A | Discharge status: Home / Self Care | Payer: Medicare Other | Source: Ambulatory Visit | Attending: Gastroenterology | Admitting: Gastroenterology

## 2018-09-28 ENCOUNTER — Other Ambulatory Visit: Payer: Self-pay

## 2018-09-28 DIAGNOSIS — Z01812 Encounter for preprocedural laboratory examination: Secondary | ICD-10-CM | POA: Diagnosis not present

## 2018-09-28 DIAGNOSIS — Z20828 Contact with and (suspected) exposure to other viral communicable diseases: Secondary | ICD-10-CM | POA: Insufficient documentation

## 2018-09-28 LAB — SARS CORONAVIRUS 2 (TAT 6-24 HRS): SARS Coronavirus 2: NEGATIVE

## 2018-09-29 NOTE — Discharge Instructions (Signed)

## 2018-10-01 ENCOUNTER — Ambulatory Visit
Admission: RE | Admit: 2018-10-01 | Discharge: 2018-10-01 | Disposition: A | Discharge status: Home / Self Care | Payer: Medicare Other | Attending: Gastroenterology | Admitting: Gastroenterology

## 2018-10-01 ENCOUNTER — Ambulatory Visit: Payer: Medicare Other | Admitting: Anesthesiology

## 2018-10-01 ENCOUNTER — Encounter
Admission: RE | Disposition: A | Discharge status: Home / Self Care | Payer: Self-pay | Source: Home / Self Care | Attending: Gastroenterology

## 2018-10-01 DIAGNOSIS — M1990 Unspecified osteoarthritis, unspecified site: Secondary | ICD-10-CM | POA: Diagnosis not present

## 2018-10-01 DIAGNOSIS — Z882 Allergy status to sulfonamides status: Secondary | ICD-10-CM | POA: Diagnosis not present

## 2018-10-01 DIAGNOSIS — Z9221 Personal history of antineoplastic chemotherapy: Secondary | ICD-10-CM | POA: Diagnosis not present

## 2018-10-01 DIAGNOSIS — Z1211 Encounter for screening for malignant neoplasm of colon: Secondary | ICD-10-CM | POA: Insufficient documentation

## 2018-10-01 DIAGNOSIS — Z923 Personal history of irradiation: Secondary | ICD-10-CM | POA: Diagnosis not present

## 2018-10-01 DIAGNOSIS — Z853 Personal history of malignant neoplasm of breast: Secondary | ICD-10-CM | POA: Diagnosis not present

## 2018-10-01 DIAGNOSIS — K6389 Other specified diseases of intestine: Secondary | ICD-10-CM | POA: Diagnosis not present

## 2018-10-01 DIAGNOSIS — K640 First degree hemorrhoids: Secondary | ICD-10-CM | POA: Diagnosis not present

## 2018-10-01 DIAGNOSIS — Z888 Allergy status to other drugs, medicaments and biological substances status: Secondary | ICD-10-CM | POA: Diagnosis not present

## 2018-10-01 DIAGNOSIS — Z885 Allergy status to narcotic agent status: Secondary | ICD-10-CM | POA: Diagnosis not present

## 2018-10-01 DIAGNOSIS — Z881 Allergy status to other antibiotic agents status: Secondary | ICD-10-CM | POA: Diagnosis not present

## 2018-10-01 DIAGNOSIS — H409 Unspecified glaucoma: Secondary | ICD-10-CM | POA: Diagnosis not present

## 2018-10-01 DIAGNOSIS — Z85038 Personal history of other malignant neoplasm of large intestine: Secondary | ICD-10-CM | POA: Insufficient documentation

## 2018-10-01 DIAGNOSIS — Z87891 Personal history of nicotine dependence: Secondary | ICD-10-CM | POA: Insufficient documentation

## 2018-10-01 HISTORY — PX: COLONOSCOPY WITH PROPOFOL: SHX5780

## 2018-10-01 SURGERY — COLONOSCOPY WITH PROPOFOL
Anesthesia: General | Site: Rectum

## 2018-10-01 MED ORDER — ACETAMINOPHEN 10 MG/ML IV SOLN: 1000.0000 mg | Freq: Once | INTRAVENOUS | Status: DC | PRN

## 2018-10-01 MED ORDER — STERILE WATER FOR IRRIGATION IR SOLN
Status: DC | PRN
  Administered 2018-10-01: .05 mL

## 2018-10-01 MED ORDER — LACTATED RINGERS IV SOLN
100.0000 mL/h | INTRAVENOUS | Status: DC
  Administered 2018-10-01: 100 mL/h via INTRAVENOUS

## 2018-10-01 MED ORDER — ONDANSETRON HCL 4 MG/2ML IJ SOLN: 4.0000 mg | Freq: Once | INTRAMUSCULAR | Status: DC | PRN

## 2018-10-01 MED ORDER — LIDOCAINE HCL (CARDIAC) PF 100 MG/5ML IV SOSY
PREFILLED_SYRINGE | INTRAVENOUS | Status: DC | PRN
  Administered 2018-10-01: 30 mg via INTRAVENOUS

## 2018-10-01 MED ORDER — PROPOFOL 10 MG/ML IV BOLUS
INTRAVENOUS | Status: DC | PRN
  Administered 2018-10-01 (×4): 40 mg via INTRAVENOUS

## 2018-10-01 SURGICAL SUPPLY — 6 items
CANISTER SUCT 1200ML W/VALVE (MISCELLANEOUS) ×2 IMPLANT
FORCEPS BIOP RAD 4 LRG CAP 4 (CUTTING FORCEPS) ×1 IMPLANT
GOWN CVR UNV OPN BCK APRN NK (MISCELLANEOUS) ×2 IMPLANT
GOWN ISOL THUMB LOOP REG UNIV (MISCELLANEOUS) ×4
KIT ENDO PROCEDURE OLY (KITS) ×2 IMPLANT
WATER STERILE IRR 250ML POUR (IV SOLUTION) ×2 IMPLANT

## 2018-10-01 NOTE — Anesthesia Procedure Notes (Signed)
Procedure Name: MAC Date/Time: 10/01/2018 8:54 AM Performed by: Georga Bora, CRNA Pre-anesthesia Checklist: Patient identified, Suction available, Emergency Drugs available, Patient being monitored and Timeout performed Patient Re-evaluated:Patient Re-evaluated prior to induction Oxygen Delivery Method: Nasal cannula

## 2018-10-01 NOTE — Op Note (Signed)
[facility] Gastroenterology Patient Name: Amy Bird Procedure Date: 10/01/2018 8:42 AM MRN: [id number] Account #: [id number] Date of Birth: [birth year] Admit Type: Outpatient Age: 68 Room: [facility] OR ROOM 01 Gender: Female Note Status: Finalized Procedure:            Colonoscopy Indications:          High risk colon cancer surveillance: Personal history                        of colon cancer Providers:            [name] MD, MD Referring MD:         Venia Carbon (Referring MD) Medicines:            Propofol per Anesthesia Complications:        No immediate complications. Procedure:            Pre-Anesthesia Assessment:                       - Prior to the procedure, a History and Physical was                        performed, and patient medications and allergies were                        reviewed. The patient's tolerance of previous                        anesthesia was also reviewed. The risks and benefits of                        the procedure and the sedation options and risks were                        discussed with the patient. All questions were                        answered, and informed consent was obtained. Prior                        Anticoagulants: The patient has taken no previous                        anticoagulant or antiplatelet agents. ASA Grade                        Assessment: II - A patient with mild systemic disease.                        After reviewing the risks and benefits, the patient was                        deemed in satisfactory condition to undergo the                        procedure.                       After obtaining informed consent, the colonoscope was  passed under direct vision. Throughout the procedure,                        the patient's blood pressure, pulse, and oxygen                        saturations were monitored continuously. The                        Colonoscope was  introduced through the anus and                        advanced to the the ileocolonic anastomosis. The                        colonoscopy was performed without difficulty. The                        patient tolerated the procedure well. The quality of                        the bowel preparation was excellent. Findings:      The perianal and digital rectal examinations were normal.      There was evidence of a prior end-to-side colo-colonic anastomosis in       the transverse colon. This was patent and was characterized by healthy       appearing mucosa. The anastomosis was traversed.      Non-bleeding internal hemorrhoids were found during retroflexion. The       hemorrhoids were Grade I (internal hemorrhoids that do not prolapse). Impression:           - Patent end-to-side colo-colonic anastomosis,                        characterized by healthy appearing mucosa.                       - Non-bleeding internal hemorrhoids.                       - No specimens collected. Recommendation:       - Discharge patient to home.                       - Resume previous diet.                       - Continue present medications.                       - Repeat colonoscopy in 5 years for surveillance. Procedure Code(s):    --- Professional ---                       [phone], Colonoscopy, flexible; diagnostic, including                        collection of specimen(s) by brushing or washing, when                        performed (separate procedure) Diagnosis Code(s):    --- Professional ---  Z85.038, Personal history of other malignant neoplasm                        of large intestine CPT copyright 2019 American Medical Association. All rights reserved. The codes documented in this report are preliminary and upon coder review may  be revised to meet current compliance requirements. [name] MD, MD 10/01/2018 9:10:55 AM This report has been signed electronically. Number of  Addenda: 0 Note Initiated On: 10/01/2018 8:42 AM Scope Withdrawal Time: 0 hours 6 minutes 5 seconds  Total Procedure Duration: 0 hours 9 minutes 43 seconds  Estimated Blood Loss: Estimated blood loss: none. Estimated blood loss: none.      [facility]

## 2018-10-01 NOTE — H&P (Signed)
Amy Lame, MD [address], [place] [address] Phone:[phone] Fax : [phone]  Primary Care Physician:  Amy Carbon, MD Primary Gastroenterologist:  Dr. Allen Bird  Pre-Procedure History & Physical: HPI:  Amy Bird is a 68 y.o. female is here for an colonoscopy.   Past Medical History:  Diagnosis Date  . Anemia   . Arthritis   . Breast cancer, left breast ([place]) 2002   Left Mastectomy, chemo + Rad tx's.  . Colon cancer ([place]) 10/2012   Partial colon resection and chemo tx's.  . Diverticulitis   . Glaucoma   . Hearing loss   . Hyperlipidemia 20 years  . Kidney stone   . Lymphedema of left arm   . Neuromuscular disorder ([place])    neuropathy  feet and hands from chemo tx  . Pneumonia   . Shingles   . Vertigo    last episode around Jan 2018    Past Surgical History:  Procedure Laterality Date  . ABDOMINAL HYSTERECTOMY  1973  . APPENDECTOMY  1969  . CATARACT EXTRACTION W/PHACO Left 07/11/2015   Procedure: CATARACT EXTRACTION PHACO AND INTRAOCULAR LENS PLACEMENT ([place]) left eye;  Surgeon: Amy Koyanagi, MD;  Location: [facility];  Service: Ophthalmology;  Laterality: Left;  . CATARACT EXTRACTION W/PHACO Right 08/27/2016   Procedure: CATARACT EXTRACTION PHACO AND INTRAOCULAR LENS PLACEMENT (IOC);  Surgeon: Amy Koyanagi, MD;  Location: [facility];  Service: Ophthalmology;  Laterality: Right;  IVA TOPICAL RIGHT  . CESAREAN SECTION    . COLON RESECTION    . COLONOSCOPY  2004  . COLONOSCOPY N/A 04/04/2016   Procedure: COLONOSCOPY;  Surgeon: Amy Sails, MD;  Location: [facility] ENDOSCOPY;  Service: Endoscopy;  Laterality: N/A;  . ESOPHAGOGASTRODUODENOSCOPY N/A 04/04/2016   Procedure: ESOPHAGOGASTRODUODENOSCOPY (EGD);  Surgeon: Amy Sails, MD;  Location: [facility] ENDOSCOPY;  Service: Endoscopy;  Laterality: N/A;  . kidney stone removal     . MASTECTOMY Bilateral 2002,2010  . salpingo oophorectmy   1973     Prior to Admission medications   Medication Sig Start Date End Date Taking? Authorizing Provider  Calcium Carb-Cholecalciferol (CALCIUM 600 + D PO) Take 1 tablet by mouth daily.    Yes [provider]  cholecalciferol (VITAMIN D3) 25 MCG (1000 UT) tablet Take 1,000 Units by mouth daily.   Yes [provider]  latanoprost (XALATAN) 0.005 % ophthalmic solution Place 1 drop into both eyes at bedtime.   Yes [provider]  vitamin B-12 (CYANOCOBALAMIN) 1000 MCG tablet Take 1,000 mcg by mouth daily.   Yes [provider]  vitamin C (ASCORBIC ACID) 500 MG tablet Take 500 mg by mouth daily.   Yes [provider]    Allergies as of 09/08/2018 - Review Complete 06/29/2018  Allergen Reaction Noted  . Hydrocodone-acetaminophen Other (See Comments) and Shortness Of Breath 10/19/2014  . Sulfa antibiotics Swelling 04/06/2012  . Cefuroxime Diarrhea 03/20/2017  . Hydrocodone-ibuprofen Other (See Comments) 11/08/2014  . Levofloxacin Hives 09/18/2016  . Statins Other (See Comments) 08/17/2014  . Other Itching 04/06/2012  . Tape Rash 04/06/2012  . Tree extract Itching 10/21/2016    Family History  Problem Relation Age of Onset  . Hypertension Mother   . Alzheimer's disease Mother   . Cancer Father        prostate  . Prostate cancer Father   . Cancer Sister        breast  . Hypertension Sister   . Cancer Brother  stomach  . Hypertension Brother   . Sarcoidosis Other        positive family history.  . Cancer Sister        breast  . Cancer Sister        breast    Social History   Socioeconomic History  . Marital status: Divorced    Spouse name: Not on file  . Number of children: 2  . Years of education: Not on file  . Highest education level: Not on file  Occupational History  . Occupation: Banker work    Comment: [employer] time  Social Needs  . Financial resource strain: Not on file  . Food insecurity    Worry: Not on  file    Inability: Not on file  . Transportation needs    Medical: Not on file    Non-medical: Not on file  Tobacco Use  . Smoking status: Former Smoker    Packs/day: 1.50    Years: 35.00    Pack years: 52.50    Types: Cigarettes    Quit date: 2002    Years since quitting: 18.7  . Smokeless tobacco: Never Used  Substance and Sexual Activity  . Alcohol use: No  . Drug use: No  . Sexual activity: Not on file  Lifestyle  . Physical activity    Days per week: Not on file    Minutes per session: Not on file  . Stress: Not on file  Relationships  . Social Herbalist on phone: Not on file    Gets together: Not on file    Attends religious service: Not on file    Active member of club or organization: Not on file    Attends meetings of clubs or organizations: Not on file    Relationship status: Not on file  . Intimate partner violence    Fear of current or ex partner: Not on file    Emotionally abused: Not on file    Physically abused: Not on file    Forced sexual activity: Not on file  Other Topics Concern  . Not on file  Social History Narrative   Lives alone.   Daughter died   Son lives nearby      No living will   Would want son as health care POA. Alternate would be sister Amy Bird   Would accept resuscitation attempts   Not sure about tube feeds    Review of Systems: See HPI, otherwise negative ROS  Physical Exam: Ht 5\' 2"  (1.575 m)   Wt 54.4 kg   BMI 21.95 kg/m  General:   Alert,  pleasant and cooperative in NAD Head:  Normocephalic and atraumatic. Neck:  Supple; no masses or thyromegaly. Lungs:  Clear throughout to auscultation.    Heart:  Regular rate and rhythm. Abdomen:  Soft, nontender and nondistended. Normal bowel sounds, without guarding, and without rebound.   Neurologic:  Alert and  oriented x4;  grossly normal neurologically.  Impression/Plan: Amy Bird is here for an colonoscopy to be performed for history of colon cancer 2014   Risks, benefits, limitations, and alternatives regarding  colonoscopy have been reviewed with the patient.  Questions have been answered.  All parties agreeable.   Amy Lame, MD  10/01/2018, 7:33 AM

## 2018-10-01 NOTE — Transfer of Care (Signed)
Immediate Anesthesia Transfer of Care Note  Patient: Amy Bird  Procedure(s) Performed: COLONOSCOPY WITH PROPOFOL (N/A Rectum)  Patient Location: PACU  Anesthesia Type: General  Level of Consciousness: awake, alert  and patient cooperative  Airway and Oxygen Therapy: Patient Spontanous Breathing and Patient connected to supplemental oxygen  Post-op Assessment: Post-op Vital signs reviewed, Patient's Cardiovascular Status Stable, Respiratory Function Stable, Patent Airway and No signs of Nausea or vomiting  Post-op Vital Signs: Reviewed and stable  Complications: No apparent anesthesia complications

## 2018-10-01 NOTE — Anesthesia Postprocedure Evaluation (Signed)
Anesthesia Post Note  Patient: VITINA RAD  Procedure(s) Performed: COLONOSCOPY WITH PROPOFOL (N/A Rectum)  Patient location during evaluation: PACU Anesthesia Type: General Level of consciousness: awake and alert Pain management: pain level controlled Vital Signs Assessment: post-procedure vital signs reviewed and stable Respiratory status: spontaneous breathing, nonlabored ventilation, respiratory function stable and patient connected to nasal cannula oxygen Cardiovascular status: blood pressure returned to baseline and stable Postop Assessment: no apparent nausea or vomiting Anesthetic complications: no    Tyre Beaver A  Bret Stamour

## 2018-10-04 ENCOUNTER — Encounter: Payer: Self-pay | Admitting: Gastroenterology

## 2018-10-04 ENCOUNTER — Telehealth: Payer: Self-pay

## 2018-10-04 NOTE — Telephone Encounter (Signed)
Started with a sore throat, chills, weak, body aches, cough, sneezing,  malaise on Friday 10-01-18. No way to check temperature. Was tested 09-28-18 for Covid to have colonoscopy on the 11th. Symptoms started Friday after the colonoscopy. She is asking what she needs to do.

## 2018-10-04 NOTE — Telephone Encounter (Signed)
Spoke to pt. Advised her of the hours for [facility] testing. She will go tomorrow.

## 2018-10-04 NOTE — Telephone Encounter (Signed)
She should get tested again. May have been exposed since then--or the test might have been too early or a false negative

## 2018-10-05 ENCOUNTER — Other Ambulatory Visit: Payer: Self-pay

## 2018-10-05 DIAGNOSIS — Z20822 Contact with and (suspected) exposure to covid-19: Secondary | ICD-10-CM

## 2018-10-07 LAB — NOVEL CORONAVIRUS, NAA: SARS-CoV-2, NAA: NOT DETECTED

## 2018-10-07 NOTE — Telephone Encounter (Addendum)
I called pt back and got v/m.unable to speak with pt.

## 2018-10-07 NOTE — Telephone Encounter (Signed)
Pt left v/m that was difficult to understand; pt is concerned has sinus infection; I called pt back to get additional info and had to leave v/m. Pt 10/07/18 lab results for covid where not not detected.

## 2018-10-08 NOTE — Telephone Encounter (Signed)
Spoke to pt. Her phone was cutting out. She was saying she was not as bad.

## 2018-10-12 ENCOUNTER — Ambulatory Visit: Payer: Medicare Other

## 2018-11-11 ENCOUNTER — Telehealth: Payer: Self-pay

## 2018-11-11 ENCOUNTER — Telehealth: Payer: Self-pay | Admitting: *Deleted

## 2018-11-11 NOTE — Telephone Encounter (Signed)
Patient requesting a return call form dietician as she is losing weight again. [phone]

## 2018-11-11 NOTE — Telephone Encounter (Signed)
Nutrition  Received message that patient interested in speaking with RD due to weight loss.  Called patient and no answer.  Left message with call back number.    Woodward Klem B. [name], [place], [place] Registered Dietitian [phone] (pager)

## 2018-11-11 NOTE — Telephone Encounter (Signed)
Nutrition Follow-up:  Patient with history of breast cancer 2002 and right colon cancer 2014.  Followed by Dr. Mike Gip.  Patient returned call and spoke with her via phone this afternoon.  Patient reports that her weight increased to 125 lb maybe in June and now she has dropped 10 lbs. Patient reports no change in her medical condition.  Reports that appetite is good.  Reports that she was out of work due to covid for about 4 months and started back to work in June/July.    Patient reports that she may have been snacking more during quarantine than now.  Reports that she has been trying to increase her portion sizes.  Breakfast is usually boiled egg, oatmeal or toast. Lunch is [place] sandwich and chips and cookies. Supper is [place] burger or [place] wings, kale, beans.  Likes fruit and yogurt, nuts as well.  Has not been drinking oral nutrition supplement.    Chart reviewed and noted colonscopy, negative covid test, sinus infection.      Medications: vit b 12, vit d, calcium, vit c  Labs: no recent labs  Anthropometrics:   Documented weight of 114 lb on 9/11 (colonscopy).    Noted per nursing note (progress) on 03/29/2018 weight check of 115 lb 6 oz.     NUTRITION DIAGNOSIS: Unintentional weight loss continues   INTERVENTION:  Encouraged patient to continue strategy to increase portion size of foods that she enjoys.  Question if activity level/eating pattern during quarantine then now that she is back working caused some weight loss.   Reviewed ways to increase calories and protein (adding cheese to sandwich, adding condiments, sauces, dressings, adding nuts, olive oil) Encouraged starting back on oral nutrition supplement at least 1 time per day. Patient concerned about carrageenan that is added to foods causing cancer.  Discussed this with patient and current information.   Encouraged patient to add snacks at least BID in addition to regular meals for added calories.  Patient has  contact information    MONITORING, EVALUATION, GOAL: weight trends, intake   NEXT VISIT: Thursday, October 29th for weight check  Amy Bird B. [name], [place], [place] Registered Dietitian [phone] (pager)

## 2018-11-18 ENCOUNTER — Inpatient Hospital Stay: Payer: Medicare Other | Attending: Hematology and Oncology

## 2018-11-18 ENCOUNTER — Other Ambulatory Visit: Payer: Self-pay

## 2018-11-18 NOTE — Progress Notes (Signed)
Nutrition Follow-up:  Patient with history of breast cancer 2002 and right colon cancer in 2014.  Followed by Dr. Mike Gip.    Met with patient in clinic following phone call last week with her concerns of weight loss.  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  Reports that she had been staying at home during quarantine for the previous 4 months.  Noticed weight started dropping down to 116 lb a few weeks ago (work scale).    Patient reports that since she and RD spoke that she has been working really hard to eat more complete meals and snacks between meals.    Denies nutrition impact symptoms.  Reports good appetite  Medications: reviewed  Labs: no new  Anthropometrics:   Weight today in clinic measured by RD 119 lb 3 oz.    Increased from 114 lb on 9/11 (colonscopy)  03/29/2018 115 lb 6 oz   NUTRITION DIAGNOSIS: Unintentional weight loss improving   INTERVENTION:  Patient feels that maybe she was not eating as much after going back to work. Patient will continue to eat 3 meals per day (including multiple food groups) and 2 snacks.   Patient agreeable to complimentary case of ensure enlive to supplement her diet.   Patient has contact information    MONITORING, EVALUATION, GOAL: weight trends, intake   NEXT VISIT: Thursday, Dec 3rd, weight check  Veronique Warga B. [name], [place], [place] Registered Dietitian [phone] (pager)

## 2018-12-23 NOTE — Progress Notes (Signed)
Nutrition  Noted patient called to cancel nutrition follow-up and weight check for today at 3:15pm.  RD available if needed in the future. Patient has contact information.  Sujey Gundry B. [name], [place], [place] Registered Dietitian [phone] (pager)

## 2018-12-27 ENCOUNTER — Telehealth: Payer: Self-pay

## 2018-12-27 NOTE — Telephone Encounter (Signed)
Nutrition Follow-up:  Patient left RD a message.  Patient reports that scheduling cancelled RD's appointment on 12/3 in error.   Returned patient's call.  Reports that she has been eating better and trying to eat a full course meals vs just one item (sandwich and chips or meat and pasta salad).  Has been drinking 1/2 of ensure daily at am break time.  Reports some issues with constipation but not on regular basis.      Medications: reviewed  Labs: no new  Anthropometrics:   Patient reports weight on work scales of 120 lb today.  Last weight in clinic 119 lb 3 oz on 10/28.     NUTRITION DIAGNOSIS: Unintentional weight loss improving   INTERVENTION:  Congratulated patient on weight gain.  Encouraged her to continue eating 3 meals per day (including multiple food groups) and 2 snacks Continue ensure (1/2 or more) daily for additional calories Patient has contact information and will reach out to RD if needed   MONITORING, EVALUATION, GOAL: weight trends, intake   NEXT VISIT: patient to call RD as needed  Yarissa Reining B. [name], [place], [place] Registered Dietitian [phone] (pager)

## 2018-12-28 ENCOUNTER — Ambulatory Visit: Payer: Medicare Other | Admitting: Hematology and Oncology

## 2018-12-28 ENCOUNTER — Other Ambulatory Visit: Payer: Medicare Other

## 2019-01-27 ENCOUNTER — Other Ambulatory Visit: Payer: Self-pay

## 2019-01-27 NOTE — Progress Notes (Signed)
Patient pre screened for office appointment, no questions or concerns today. Patient reminded of upcoming appointment time and date. 

## 2019-01-28 ENCOUNTER — Inpatient Hospital Stay (HOSPITAL_BASED_OUTPATIENT_CLINIC_OR_DEPARTMENT_OTHER): Payer: Medicare PPO | Admitting: Oncology

## 2019-01-28 ENCOUNTER — Other Ambulatory Visit: Payer: Self-pay

## 2019-01-28 ENCOUNTER — Inpatient Hospital Stay: Payer: Medicare PPO | Attending: Hematology and Oncology

## 2019-01-28 VITALS — BP 115/70 | HR 89 | Temp 95.8°F | Resp 18 | Wt 121.2 lb

## 2019-01-28 DIAGNOSIS — Z923 Personal history of irradiation: Secondary | ICD-10-CM | POA: Insufficient documentation

## 2019-01-28 DIAGNOSIS — Z90710 Acquired absence of both cervix and uterus: Secondary | ICD-10-CM | POA: Insufficient documentation

## 2019-01-28 DIAGNOSIS — D696 Thrombocytopenia, unspecified: Secondary | ICD-10-CM

## 2019-01-28 DIAGNOSIS — C182 Malignant neoplasm of ascending colon: Secondary | ICD-10-CM

## 2019-01-28 DIAGNOSIS — Z90722 Acquired absence of ovaries, bilateral: Secondary | ICD-10-CM | POA: Diagnosis not present

## 2019-01-28 DIAGNOSIS — I890 Lymphedema, not elsewhere classified: Secondary | ICD-10-CM | POA: Diagnosis not present

## 2019-01-28 DIAGNOSIS — Z800 Family history of malignant neoplasm of digestive organs: Secondary | ICD-10-CM | POA: Diagnosis not present

## 2019-01-28 DIAGNOSIS — Z8249 Family history of ischemic heart disease and other diseases of the circulatory system: Secondary | ICD-10-CM | POA: Diagnosis not present

## 2019-01-28 DIAGNOSIS — Z8041 Family history of malignant neoplasm of ovary: Secondary | ICD-10-CM | POA: Insufficient documentation

## 2019-01-28 DIAGNOSIS — Z85038 Personal history of other malignant neoplasm of large intestine: Secondary | ICD-10-CM | POA: Insufficient documentation

## 2019-01-28 DIAGNOSIS — D709 Neutropenia, unspecified: Secondary | ICD-10-CM

## 2019-01-28 DIAGNOSIS — Z87891 Personal history of nicotine dependence: Secondary | ICD-10-CM | POA: Insufficient documentation

## 2019-01-28 DIAGNOSIS — E538 Deficiency of other specified B group vitamins: Secondary | ICD-10-CM

## 2019-01-28 DIAGNOSIS — R634 Abnormal weight loss: Secondary | ICD-10-CM | POA: Insufficient documentation

## 2019-01-28 DIAGNOSIS — Z79899 Other long term (current) drug therapy: Secondary | ICD-10-CM | POA: Diagnosis not present

## 2019-01-28 DIAGNOSIS — D72819 Decreased white blood cell count, unspecified: Secondary | ICD-10-CM | POA: Diagnosis not present

## 2019-01-28 DIAGNOSIS — Z9079 Acquired absence of other genital organ(s): Secondary | ICD-10-CM | POA: Insufficient documentation

## 2019-01-28 DIAGNOSIS — Z853 Personal history of malignant neoplasm of breast: Secondary | ICD-10-CM | POA: Diagnosis not present

## 2019-01-28 DIAGNOSIS — Z9221 Personal history of antineoplastic chemotherapy: Secondary | ICD-10-CM | POA: Diagnosis not present

## 2019-01-28 DIAGNOSIS — Z803 Family history of malignant neoplasm of breast: Secondary | ICD-10-CM | POA: Insufficient documentation

## 2019-01-28 DIAGNOSIS — D61818 Other pancytopenia: Secondary | ICD-10-CM | POA: Diagnosis not present

## 2019-01-28 DIAGNOSIS — R978 Other abnormal tumor markers: Secondary | ICD-10-CM

## 2019-01-28 DIAGNOSIS — Z9013 Acquired absence of bilateral breasts and nipples: Secondary | ICD-10-CM | POA: Diagnosis not present

## 2019-01-28 LAB — COMPREHENSIVE METABOLIC PANEL
ALT: 12 U/L (ref 0–44)
AST: 23 U/L (ref 15–41)
Albumin: 4.2 g/dL (ref 3.5–5.0)
Alkaline Phosphatase: 70 U/L (ref 38–126)
Anion gap: 7 (ref 5–15)
BUN: 13 mg/dL (ref 8–23)
CO2: 31 mmol/L (ref 22–32)
Calcium: 9.4 mg/dL (ref 8.9–10.3)
Chloride: 102 mmol/L (ref 98–111)
Creatinine, Ser: 0.76 mg/dL (ref 0.44–1.00)
GFR calc Af Amer: >60 mL/min (ref >60)
GFR calc non Af Amer: >60 mL/min (ref >60)
Glucose, Bld: 98 mg/dL (ref 70–99)
Potassium: 4 mmol/L (ref 3.5–5.1)
Sodium: 140 mmol/L (ref 135–145)
Total Bilirubin: 0.7 mg/dL (ref 0.3–1.2)
Total Protein: 7.8 g/dL (ref 6.5–8.1)

## 2019-01-28 LAB — CBC WITH DIFFERENTIAL/PLATELET
Abs Immature Granulocytes: 0 10*3/uL (ref 0.00–0.07)
Basophils Absolute: 0 10*3/uL (ref 0.0–0.1)
Basophils Relative: 0 %
Eosinophils Absolute: 0 10*3/uL (ref 0.0–0.5)
Eosinophils Relative: 0 %
HCT: 41.2 % (ref 36.0–46.0)
Hemoglobin: 13.5 g/dL (ref 12.0–15.0)
Immature Granulocytes: 0 %
Lymphocytes Relative: 36 %
Lymphs Abs: 0.9 10*3/uL (ref 0.7–4.0)
MCH: 32 pg (ref 26.0–34.0)
MCHC: 32.8 g/dL (ref 30.0–36.0)
MCV: 97.6 fL (ref 80.0–100.0)
Monocytes Absolute: 0.1 10*3/uL (ref 0.1–1.0)
Monocytes Relative: 6 %
Neutro Abs: 1.4 10*3/uL — ABNORMAL LOW (ref 1.7–7.7)
Neutrophils Relative %: 58 %
Platelets: 145 10*3/uL — ABNORMAL LOW (ref 150–400)
RBC: 4.22 MIL/uL (ref 3.87–5.11)
RDW: 12.2 % (ref 11.5–15.5)
WBC: 2.4 10*3/uL — ABNORMAL LOW (ref 4.0–10.5)
nRBC: 1.3 % — ABNORMAL HIGH (ref 0.0–0.2)

## 2019-01-28 NOTE — Progress Notes (Signed)
Prescription for Mastectomy bra with prostetics and lymphadema sleeve faxed to [facility] medical supply. Pt will call Monday to check on supplies.   Fax# [phone]

## 2019-01-28 NOTE — Progress Notes (Signed)
Patient here for follow up, she is a former Dr. Mike Gip pt. Pt complains of worsening neuropathy to feet. Pt reports weight loss and since then she has been having constipation and is requesting something to help this issue. Pt also requesting prescription for Breast prosthetics and lymphadema sleeves.

## 2019-01-29 ENCOUNTER — Encounter: Payer: Self-pay | Admitting: Oncology

## 2019-01-29 LAB — CANCER ANTIGEN 27.29: CA 27.29: 53.5 U/mL — ABNORMAL HIGH (ref 0.0–38.6)

## 2019-01-29 LAB — CEA: CEA: 8 ng/mL — ABNORMAL HIGH (ref 0.0–4.7)

## 2019-01-29 NOTE — Progress Notes (Signed)
[facility] day:  01/29/2019  Chief Complaint: Follow-up for breast cancer and colon cancer, pancytopenia   HPI  Amy Bird is a 69 y.o. female with a history of left breast cancer (2002), right sided colon cancer (2014), mild pancytopenia who is seen for 6 month assessment. Patient previously followed up with Dr. Mike Gip and switched care to me on 01/28/2019. Extensive previous medical records reviewed was performed by me  #Breast cancer Left breast cancer 2002, per note, underwent left mastectomy in March 2002. Pathology was not available. She received Adriamycin and Cytoxan [completed 07/2000/], followed by Herceptin and Taxotere in 08/2000 Patient did not receive any adjuvant hormone therapy.  She underwent a right-sided mastectomy in 02/2008  CA 27-29 has been followed was 40.9 on 06/06/2010, 42.6 on 02/04/2011, 35.3 on 03/31/2012, 38.1 on 11/09/2012, 52.3 on 09/17/2015, 50.5 on 10/01/2015, 47.8 on 02/11/2016, 36.1 on 06/20/2016, 36.6 on 10/20/2016, 42.6 on 02/23/2017, 44.6 on 03/26/2017, 37.7 on 05/22/2017, 47.1 on 08/25/2017, 45.7 on 12/28/2017, and 52.2 on 06/28/2018.  #Colon cancer, Diagnosed with colon cancer in 12/2022, underwent right hemicolectomy on 12/25/2022.  Pathology reviewed grade 2 Adenocarcinoma which extended through the muscularis propria and into the adjacent adipose.1/27 lymph nodes were positive.  Pathological stage [id number] Patient was treated with CALGB protocol with FOLFOX chemotherapy +/- celecoxib.  She withdrew from the study in 02/25/2013.  She described trouble with counts.  She received 6 cycles of FOLFOX (01/28/2013 - 04/29/2013).  Colonoscopy on 04/04/2016 revealed a 2 mm polyp in the distal sigmoid colon.  Pathology revealed a serrated polyp.  EGD on 04/04/2016 revealed LA grade A esophagitis, non-bleeding erosive gastropathy, gastritis, and duodenitis.  Gastric and GEJ pathology revealed moderate chronic  active inflammation, H pylori associated.  CEA has been followed was 3.8 on 12/09/2012, 6.3 on 01/27/2013, 10.6 on 05/26/2013, 5.9 on 09/22/2013, 7.1 on 05/10/2014, 7.0 on 11/08/2014, 5.8 on 05/08/2015, 7.2 on 09/17/2015, 5.9 on 10/01/2015, 6.5 on 02/11/2016, 6.9 on 10/20/2016, 7.6 on 02/23/2017, 7.3 on 08/25/2017, 6.7 on 12/28/2017, and 7.7 on 06/28/2018.  PET scan on 10/09/2015 revealed no evidence of recurrent disease.  Chest, abdomen, and pelvic CT scan on 02/15/2016 revealed no findings of recurrent malignancy.  There were radiation therapy related opacities in the left lung.  There was prominent stool throughout the colon.  There was lumbar degenerative disc disease.  PET scan on 09/11/2017 revealed no hypermetabolic residual or recurrent disease.  #Chronic history of pancytopenia since 10/2012. Bone marrow aspiration and biopsy in 10/2012 was negative for myelodysplastic syndrome.  #Weight loss, since discontinuation of antidepressants years ago.  Thyroid function has been checked which was normal. Labs all 02/26/2016 reviewed normal cortisol, sed rate, ANA.  HIV negative.   Left lower extremity duplex on 09/19/2015 revealed no evidence of DVT.  She had a left popliteal fossa Baker's cyst.  She has been diagnosed with H pylori (stool antigen + 05/17/2016) and treated x 3.  She has B12 deficiency.  B12 was 251 on 08/27/2017 and 760 on 09/24/2017.  Folate was 12.  She began oral B12 on 08/28/2017.  She has been off B12 x 1 month.  #Patient reports having bilateral lower extremity neuropathy.  She is not currently on any thing for neuropathy. Also she has not followed up with lymphedema clinic for a while.   Past Medical History:  Diagnosis Date  . Anemia   . Arthritis   . Breast cancer, left breast ([place]) 2002   Left  Mastectomy, chemo + Rad tx's.  . Colon cancer ([place]) 10/2012   Partial colon resection and chemo tx's.  . Diverticulitis   . Glaucoma   . Hearing loss   .  Hyperlipidemia 20 years  . Kidney stone   . Lymphedema of left arm   . Neuromuscular disorder ([place])    neuropathy  feet and hands from chemo tx  . Pneumonia   . Shingles   . Vertigo    last episode around Jan 2018    Past Surgical History:  Procedure Laterality Date  . ABDOMINAL HYSTERECTOMY  1973  . APPENDECTOMY  1969  . CATARACT EXTRACTION W/PHACO Left 07/11/2015   Procedure: CATARACT EXTRACTION PHACO AND INTRAOCULAR LENS PLACEMENT ([place]) left eye;  Surgeon: Leandrew Koyanagi, MD;  Location: [place];  Service: Ophthalmology;  Laterality: Left;  . CATARACT EXTRACTION W/PHACO Right 08/27/2016   Procedure: CATARACT EXTRACTION PHACO AND INTRAOCULAR LENS PLACEMENT (IOC);  Surgeon: Leandrew Koyanagi, MD;  Location: [place];  Service: Ophthalmology;  Laterality: Right;  IVA TOPICAL RIGHT  . CESAREAN SECTION    . COLON RESECTION    . COLONOSCOPY  2004  . COLONOSCOPY N/A 04/04/2016   Procedure: COLONOSCOPY;  Surgeon: Lollie Sails, MD;  Location: [facility] ENDOSCOPY;  Service: Endoscopy;  Laterality: N/A;  . COLONOSCOPY WITH PROPOFOL N/A 10/01/2018   Procedure: COLONOSCOPY WITH PROPOFOL;  Surgeon: [name], MD;  Location: [place];  Service: Endoscopy;  Laterality: N/A;  . ESOPHAGOGASTRODUODENOSCOPY N/A 04/04/2016   Procedure: ESOPHAGOGASTRODUODENOSCOPY (EGD);  Surgeon: Lollie Sails, MD;  Location: [facility] ENDOSCOPY;  Service: Endoscopy;  Laterality: N/A;  . kidney stone removal     . MASTECTOMY Bilateral 2002,2010  . salpingo oophorectmy   1973    Family History  Problem Relation Age of Onset  . Hypertension Mother   . Alzheimer's disease Mother   . Cancer Father        prostate  . Prostate cancer Father   . Cancer Sister        breast  . Hypertension Sister   . Cancer Brother        stomach  . Hypertension Brother   . Sarcoidosis Other        positive family history.  . Cancer Sister        breast  . Cancer Sister        breast     Social History:  reports that she quit smoking about 19 years ago. Her smoking use included cigarettes. She has a 52.50 pack-year smoking history. She has never used smokeless tobacco. She reports that she does not drink alcohol or use drugs.  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  She quit smoking in 2003.  She lives in [place].  She belongs to the [employer].  The patient is alone today.  Allergies:  Allergies  Allergen Reactions  . Hydrocodone-Acetaminophen Other (See Comments) and Shortness Of Breath    Not witnessed info gathered as part of patient screening.  PT states her chest felt very heavy and difficulty breathing.  . Sulfa Antibiotics Swelling  . Cefuroxime Diarrhea  . Hydrocodone-Ibuprofen Other (See Comments)    Patient feels like there is a heavy wight on her chest  . Levofloxacin Hives  . Statins Other (See Comments)    Leg cramps and groin pain  . Other Itching    Tree Nuts  . Tape Rash  . Tree Extract Itching    Current Medications: Current Outpatient Medications  Medication Sig Dispense Refill  . Calcium Carb-Cholecalciferol (CALCIUM 600 + D PO) Take 1 tablet by mouth daily.     . cholecalciferol (VITAMIN D3) 25 MCG (1000 UT) tablet Take 1,000 Units by mouth daily.    Marland Kitchen latanoprost (XALATAN) 0.005 % ophthalmic solution Place 1 drop into both eyes at bedtime.    . vitamin B-12 (CYANOCOBALAMIN) 1000 MCG tablet Take 1,000 mcg by mouth daily.    . vitamin C (ASCORBIC ACID) 500 MG tablet Take 500 mg by mouth daily.     No current facility-administered medications for this visit.    Review of Systems  Constitutional: Negative for appetite change, chills, fatigue and fever.  HENT:   Negative for hearing loss and voice change.   Eyes: Negative for eye problems.  Respiratory: Negative for chest tightness and cough.   Cardiovascular: Negative for chest pain.  Gastrointestinal: Negative for abdominal distention, abdominal pain and blood in stool.   Endocrine: Negative for hot flashes.  Genitourinary: Negative for difficulty urinating and frequency.   Musculoskeletal: Negative for arthralgias.       She has some left upper extremity edema, uses lymphedema sleeve.  Skin: Negative for itching and rash.  Neurological: Positive for numbness. Negative for extremity weakness.  Hematological: Negative for adenopathy.  Psychiatric/Behavioral: Negative for confusion.      Physical Exam:  Blood pressure 115/70, pulse 89, temperature (!) 95.8 F (35.4 C), resp. rate 18, weight 121 lb 3.2 oz (55 kg). Physical Exam  Constitutional: She is oriented to person, place, and time. No distress.  Thin built, she walks independently  HENT:  Head: Normocephalic and atraumatic.  Nose: Nose normal.  Mouth/Throat: Oropharynx is clear and moist. No oropharyngeal exudate.  Eyes: Pupils are equal, round, and reactive to light. EOM are normal. No scleral icterus.  Cardiovascular: Normal rate and regular rhythm.  No murmur heard. Pulmonary/Chest: Effort normal. No respiratory distress. She has no rales. She exhibits no tenderness.  Abdominal: Soft. She exhibits no distension. There is no abdominal tenderness.  Musculoskeletal:        General: Normal range of motion.     Cervical back: Normal range of motion and neck supple.     Comments: Left upper arm-lymphedema sleeve  Neurological: She is alert and oriented to person, place, and time.  Skin: Skin is warm and dry. No erythema.  Psychiatric: Affect normal.   Status post bilateral mastectomy, no palpable mass at mastectomy sites.  No palpable axillary lymph node adenopathy bilaterally.  Chronic left axillary scarring tissue.  Labs reviewed CBC Latest Ref Rng & Units 01/28/2019 06/28/2018 12/28/2017  WBC 4.0 - 10.5 K/uL 2.4(L) 2.8(L) 3.1(L)  Hemoglobin 12.0 - 15.0 g/dL 13.5 13.1 13.3  Hematocrit 36.0 - 46.0 % 41.2 38.9 40.3  Platelets 150 - 400 K/uL 145(L) 134(L) 149(L)   CMP Latest Ref Rng & Units  01/28/2019 06/28/2018 12/28/2017  Glucose 70 - 99 mg/dL 98 140(H) 116(H)  BUN 8 - 23 mg/dL 13 15 14   Creatinine 0.44 - 1.00 mg/dL 0.76 0.86 1.03(H)  Sodium 135 - 145 mmol/L 140 137 137  Potassium 3.5 - 5.1 mmol/L 4.0 4.0 3.9  Chloride 98 - 111 mmol/L 102 102 101  CO2 22 - 32 mmol/L 31 27 30   Calcium 8.9 - 10.3 mg/dL 9.4 8.6(L) 8.6(L)  Total Protein 6.5 - 8.1 g/dL 7.8 7.4 7.2  Total Bilirubin 0.3 - 1.2 mg/dL 0.7 0.7 0.6  Alkaline Phos 38 - 126 U/L 70 67 62  AST 15 -  41 U/L 23 24 22   ALT 0 - 44 U/L 12 11 12    RADIOGRAPHIC STUDIES: I have personally reviewed the radiological images as listed and agreed with the findings in the report. No results found.   Assessment:  Amy Bird is a 69 y.o. female with a history of left breast cancer (2002), right sided stage III colon cancer (2014), and mild pancytopenia. 1. Leukopenia, unspecified type   2. Cancer of ascending colon ([place])   3. Lymphedema of left upper extremity   4. Neutropenia, unspecified type ([place])   5. Thrombocytopenia ([place])   6. History of left breast cancer   7. Elevated tumor markers   8. B12 deficiency    Labs reviewed and discussed with patient.  #History of left breast cancer status post mastectomy bilaterally, adjuvant chemotherapy Clinically she is doing well.  Previous breast pathology was not available.  She is not on any adjuvant endocrine therapy. Physical examination nonremarkable. Continue history and physical every 6 [place] [address] has been monitored-level is at 53.5, slightly elevated which is her baseline.  #History of ascending colon cancer, stage III, status post hemicolectomy and adjuvant chemotherapy. CEA has been monitored, level at 8.  Patient has a chronic history of elevated CEA. I discussed with patient that colon cancer is about 6 years ago. We will obtain CT images if clinically indicated.  CEA will continue to be monitored.  #Chronic pancytopenia including mild neutropenia with ANC of 1.4,  platelet count 1 45,000, Patient has previously had extensive work-up including bone marrow biopsy.  Mild pancytopenia started after she finished colon cancer chemotherapy.  Continue monitor at this point.  If progressively worsens in the future, will repeat bone marrow biopsy.  She agrees with the plan.  #History of bilateral mastectomy.  History of left upper extremity lymphedema, she has lost follow-up with lymphedema clinic.  Will refer her to reestablish care. #History of vitamin B12 deficiency, will check vitamin B12 level at next visit.  Advised patient to continue take oral vitamin B12 supplementation.  6.   RTC in 6 months for MD assessment and labs (CBC with diff, CMP, CEA, CA27.29).   Earlie Server, MD  01/29/2019, 3:15 PM

## 2019-02-01 ENCOUNTER — Other Ambulatory Visit: Payer: Self-pay

## 2019-02-01 ENCOUNTER — Telehealth: Payer: Self-pay | Admitting: *Deleted

## 2019-02-01 NOTE — Telephone Encounter (Signed)
Patient called asking for someone to go over her results from her labs. Her next appointment is in July and her markers have gone up. Please return her call  Contains abnormal data CEA Order: [id number] Status:  Final result  Visible to patient:  Yes (MyChart)  Next appt:  02/02/2019 at 09:00 AM in Oncology ([place], [place], [place])  Dx:  Cancer of ascending colon [facility]); Thro...  Ref Range & Units 4 d ago  CEA 0.0 - 4.7 ng/mL 8.0High    Comment: (NOTE)                Nonsmokers     <3.9                Smokers       <5.6  Roche Diagnostics Electrochemiluminescence Immunoassay  (ECLIA)  Values obtained with different assay methods or kits  cannot be used interchangeably. Results cannot be  interpreted as absolute evidence of the presence or  absence of malignant disease.  Performed At: [facility]  [place], [place] [id number]  Rush [place] MD [id number]   Resulting Agency  [facility] CLIN LAB      Specimen Collected: 01/28/19 13:41  Last Resulted: 01/29/19 03:35     Lab Flowsheet   Order Details   View Encounter   Lab and Collection Details   Routing   Result History         Other Results from 01/28/2019  Contains abnormal data Cancer antigen 27.29  Status:  Final result  Visible to patient:  Yes (MyChart)  Next appt:  02/02/2019 at 09:00 AM in Oncology ([place], [place], [place])  Dx:  Cancer of ascending colon [facility]); Thro... Order: [id number]  Ref Range & Units 4 d ago  CA 27.29 0.0 - 38.6 U/mL 53.5High    Comment: (NOTE)  [employer] Centaur Immunochemiluminometric Methodology [facility])  Values obtained with different assay methods or kits cannot be used  interchangeably. Results cannot be interpreted as absolute evidence  of the presence or absence of malignant disease.  Performed At: [facility]  [place], [place] [id number]  Rush Farmer MD [id number]   Resulting Agency  [facility] CLIN  LAB      Specimen Collected: 01/28/19 13:41  Last Resulted: 01/29/19 03:35     Lab Flowsheet   Order Details   View Encounter   Lab and Collection Details   Routing   Result History           Comprehensive metabolic panel  Status:  Final result  Visible to patient:  Yes (MyChart)  Next appt:  02/02/2019 at 09:00 AM in Oncology ([place], [place], [place])  Dx:  Cancer of ascending colon [facility]); Thro... Order: [id number]  Ref Range & Units 4 d ago  Sodium 135 - 145 mmol/L 140   Potassium 3.5 - 5.1 mmol/L 4.0   Chloride 98 - 111 mmol/L 102   CO2 22 - 32 mmol/L 31   Glucose, Bld 70 - 99 mg/dL 98   BUN 8 - 23 mg/dL 13   Creatinine, Ser 0.44 - 1.00 mg/dL 0.76   Calcium 8.9 - 10.3 mg/dL 9.4   Total Protein 6.5 - 8.1 g/dL 7.8   Albumin 3.5 - 5.0 g/dL 4.2   AST 15 - 41 U/L 23   ALT 0 - 44 U/L 12   Alkaline Phosphatase 38 - 126 U/L 70   Total Bilirubin 0.3 - 1.2 mg/dL 0.7   GFR calc non Af Amer >60 mL/min >  60   GFR calc Af Amer >60 mL/min >60   Anion gap 5 - 15 7   Comment: Performed at [facility], [address] [place], [place] [address]  Resulting Agency  [facility] CLIN LAB      Specimen Collected: 01/28/19 13:41  Last Resulted: 01/28/19 14:20     Lab Flowsheet   Order Details   View Encounter   Lab and Collection Details   Routing   Result History           Contains abnormal data CBC with Differential/Platelet  Status:  Final result  Visible to patient:  Yes (MyChart)  Next appt:  02/02/2019 at 09:00 AM in Oncology ([place], [place], [place])  Dx:  Cancer of ascending colon ([place]); Thro... Order: [id number]  Ref Range & Units 4 d ago  WBC 4.0 - 10.5 K/uL 2.4Low    RBC 3.87 - 5.11 MIL/uL 4.22   Hemoglobin 12.0 - 15.0 g/dL 13.5   HCT 36.0 - 46.0 % 41.2   MCV 80.0 - 100.0 fL 97.6   MCH 26.0 - 34.0 pg 32.0   MCHC 30.0 - 36.0 g/dL 32.8   RDW 11.5 - 15.5 % 12.2   Platelets 150 - 400 K/uL 145Low    nRBC 0.0 - 0.2 % 1.3High    Neutrophils Relative % %  58   Neutro Abs 1.7 - 7.7 K/uL 1.4Low    Lymphocytes Relative % 36   Lymphs Abs 0.7 - 4.0 K/uL 0.9   Monocytes Relative % 6   Monocytes Absolute 0.1 - 1.0 K/uL 0.1   Eosinophils Relative % 0   Eosinophils Absolute 0.0 - 0.5 K/uL 0.0   Basophils Relative % 0   Basophils Absolute 0.0 - 0.1 K/uL 0.0   Immature Granulocytes % 0   Abs Immature Granulocytes 0.00 - 0.07 K/uL 0.00   Comment: Performed at [facility], [address] [place], [place] [address]  Resulting Agency  [facility] CLIN LAB      Specimen Collected: 01/28/19 13:41  Last Resulted: 01/28/19 14:14

## 2019-02-02 ENCOUNTER — Other Ambulatory Visit: Payer: Self-pay

## 2019-02-02 ENCOUNTER — Inpatient Hospital Stay: Payer: Medicare PPO | Admitting: Occupational Therapy

## 2019-02-02 DIAGNOSIS — I890 Lymphedema, not elsewhere classified: Secondary | ICD-10-CM

## 2019-02-02 NOTE — Therapy (Signed)
[facility] [address] [place], [place], [place], [address] Phone: [phone]   Fax:  [phone]  Occupational Therapy Evaluation  Patient Details  Name: Amy Bird MRN: [id number] Date of Birth: 25-Sep-1950 No data recorded  Encounter Date: 02/02/2019  OT End of Session - 02/02/19 1000    Visit Number  0       Past Medical History:  Diagnosis Date  . Anemia   . Arthritis   . Breast cancer, left breast ([place]) 2002   Left Mastectomy, chemo + Rad tx's.  . Colon cancer ([place]) 10/2012   Partial colon resection and chemo tx's.  . Diverticulitis   . Glaucoma   . Hearing loss   . Hyperlipidemia 20 years  . Kidney stone   . Lymphedema of left arm   . Neuromuscular disorder ([place])    neuropathy  feet and hands from chemo tx  . Pneumonia   . Shingles   . Vertigo    last episode around Jan 2018    Past Surgical History:  Procedure Laterality Date  . ABDOMINAL HYSTERECTOMY  1973  . APPENDECTOMY  1969  . CATARACT EXTRACTION W/PHACO Left 07/11/2015   Procedure: CATARACT EXTRACTION PHACO AND INTRAOCULAR LENS PLACEMENT ([place]) left eye;  Surgeon: Leandrew Koyanagi, MD;  Location: [place];  Service: Ophthalmology;  Laterality: Left;  . CATARACT EXTRACTION W/PHACO Right 08/27/2016   Procedure: CATARACT EXTRACTION PHACO AND INTRAOCULAR LENS PLACEMENT (I[facility]);  Surgeon: Leandrew Koyanagi, MD;  Location: [place];  Service: Ophthalmology;  Laterality: Right;  IVA TOPICAL RIGHT  . CESAREAN SECTION    . COLON RESECTION    . COLONOSCOPY  2004  . COLONOSCOPY N/A 04/04/2016   Procedure: COLONOSCOPY;  Surgeon: Lollie Sails, MD;  Location: [facility] ENDOSCOPY;  Service: Endoscopy;  Laterality: N/A;  . COLONOSCOPY WITH PROPOFOL N/A 10/01/2018   Procedure: COLONOSCOPY WITH PROPOFOL;  Surgeon: [name], MD;  Location: [place];  Service: Endoscopy;  Laterality: N/A;  . ESOPHAGOGASTRODUODENOSCOPY N/A  04/04/2016   Procedure: ESOPHAGOGASTRODUODENOSCOPY (EGD);  Surgeon: Lollie Sails, MD;  Location: [facility] ENDOSCOPY;  Service: Endoscopy;  Laterality: N/A;  . kidney stone removal     . MASTECTOMY Bilateral 2002,2010  . salpingo oophorectmy   1973    There were no vitals filed for this visit.  Subjective Assessment - 02/02/19 0956    Subjective   I tried to get a new sleeve but that lady that helped me before said she don't do it anymore - and I want this same sleeve -because it worked very well for my arm    Currently in Pain?  No/denies          LYMPHEDEMA/ONCOLOGY QUESTIONNAIRE - 02/02/19 0903      Right Upper Extremity Lymphedema   15 cm Proximal to Olecranon Process  28.5 cm    10 cm Proximal to Olecranon Process  28.5 cm    Olecranon Process  23.5 cm    15 cm Proximal to Ulnar Styloid Process  22.4 cm    10 cm Proximal to Ulnar Styloid Process  18.5 cm    Just Proximal to Ulnar Styloid Process  14.5 cm    Across Hand at [employer]  18 cm    At [place] of 2nd Digit  5.8 cm    At [facility] of Thumb  5.3 cm      Left Upper Extremity Lymphedema   15 cm Proximal to Olecranon Process  28 cm    10 cm Proximal to Olecranon Process  27 cm    Olecranon Process  23.5 cm    15 cm Proximal to Ulnar Styloid Process  22.5 cm    10 cm Proximal to Ulnar Styloid Process  19.4 cm    Just Proximal to Ulnar Styloid Process  14 cm    Across Hand at [employer]  18 cm    At [place] of 2nd Digit  5.4 cm    At [facility] of Thumb  5.8 cm       Pt refer to OT by Dr Tasia Catchings for establish care again for L UE lymphedema - pt was seen the last time by this OT in 2017  She had some lymphedema/edema at that time also in L LE below knee  Pt wear Jobst Elvarex soft sleeve with wide band with great containment of her L UE lymphedema- no glove needed  And she wear over the counter compression knee highs on bilateral LE    Pt's measurements this date in range for L UE -only ones that was increase was in  forearm- upper arm was actually decrease - but could be muscle bulk - because she lost some weight last year. Went from 130- down to 105 per pt - but she gained back to about 120 lbs  Pt has appt at [employer] [facility] with Helene Kelp to get measure for L UE Jobst Elvarex soft sleeve with wide band And for measuring for bra with prosthesis  Did recommend pt to get new over the counter compression knee highs - pt measurements was increase in L calf by 4 cm at 30 cm height, and 2 cm at 20 cm height - compare to 2017 - it was only 1-2 cm increase  And  Upon assessment she did not had any compression in her knee high compression sleeves. She report they were very old     did ask me about some exercises to increase muscle bulk and strength in UE. Provided with pt some RTB HEP  Scapula squeezes Shoulder extention  And shoulder horizontal ABD  12 reps Can increase to 2nd set next week and then 3rd week 3 sets if no increase pain  Can then add bicep curls, tricep and over head D1-2 pattern  Pain free    Pt will contact me if she has any questions                          Patient will benefit from skilled therapeutic intervention in order to improve the following deficits and impairments:           Visit Diagnosis: Lymphedema of left upper extremity    Problem List Patient Active Problem List   Diagnosis Date Noted  . Personal history of colon cancer   . Preventative health care 04/01/2018  . Aortic atherosclerosis ([place]) 04/01/2018  . Chronic renal disease, stage III 04/01/2018  . Advance directive discussed with patient 04/01/2018  . Elevated tumor markers 11/15/2017  . Thrombocytopenia ([place]) 08/27/2017  . Malnutrition of mild degree ([place]) 03/19/2017  . Lymphedema of left upper extremity 02/23/2017  . B12 deficiency 02/10/2016  . History of left breast cancer 09/17/2015  . Cancer of ascending colon ([place]) 09/17/2015  . Leukopenia 09/17/2015  . Ankle edema,  bilateral 09/16/2015  . Cyst of pituitary gland ([place]) 11/17/2014  . Absolute anemia 11/08/2014  . Neutropenia ([place]) 11/08/2014  . Peripheral neuropathy due  to chemotherapy ([place]) 08/10/2014  . Chronic RLQ pain 08/10/2014  . Dyspepsia 08/10/2014  . Allergic rhinitis 01/29/2005    Rosalyn Gess OTR/L,CLT 02/02/2019, 10:04 AM  [facility] [address] [place], [place], [address] Phone: [phone]   Fax:  [phone]  Name: Amy Bird MRN: [id number] Date of Birth: 03/16/1950

## 2019-02-02 NOTE — Telephone Encounter (Signed)
I have called the patient and communicated with her about results.  CEA is chronically elevated and there is a mild increase.  Recommend repeat CEA at next visit, if persistently increased, will obtain CT scan. She appreciates explanation.

## 2019-02-02 NOTE — Telephone Encounter (Signed)
Amy Bird, please schedule lab appointment in 3 months for repeat CEA. Thanks.

## 2019-02-04 DIAGNOSIS — Z9013 Acquired absence of bilateral breasts and nipples: Secondary | ICD-10-CM | POA: Diagnosis not present

## 2019-02-04 DIAGNOSIS — C50111 Malignant neoplasm of central portion of right female breast: Secondary | ICD-10-CM | POA: Diagnosis not present

## 2019-02-04 DIAGNOSIS — C50112 Malignant neoplasm of central portion of left female breast: Secondary | ICD-10-CM | POA: Diagnosis not present

## 2019-03-03 DIAGNOSIS — Z9013 Acquired absence of bilateral breasts and nipples: Secondary | ICD-10-CM | POA: Diagnosis not present

## 2019-03-03 DIAGNOSIS — C50112 Malignant neoplasm of central portion of left female breast: Secondary | ICD-10-CM | POA: Diagnosis not present

## 2019-03-03 DIAGNOSIS — C50111 Malignant neoplasm of central portion of right female breast: Secondary | ICD-10-CM | POA: Diagnosis not present

## 2019-03-14 ENCOUNTER — Ambulatory Visit: Payer: Medicare PPO | Attending: Internal Medicine | Admitting: Occupational Therapy

## 2019-03-14 ENCOUNTER — Other Ambulatory Visit: Payer: Self-pay

## 2019-03-14 DIAGNOSIS — I972 Postmastectomy lymphedema syndrome: Secondary | ICD-10-CM

## 2019-03-14 NOTE — Therapy (Signed)
[facility] [address] [place], [address] Phone: [phone]   Fax:  [phone]  Occupational Therapy Screen   Patient Details  Name: Amy Bird MRN: [id number] Date of Birth: [birth year] No data recorded  Encounter Date: 03/14/2019    Past Medical History:  Diagnosis Date  . Anemia   . Arthritis   . Breast cancer, left breast ([place]) 2002   Left Mastectomy, chemo + Rad tx's.  . Colon cancer ([place]) 10/2012   Partial colon resection and chemo tx's.  . Diverticulitis   . Glaucoma   . Hearing loss   . Hyperlipidemia 20 years  . Kidney stone   . Lymphedema of left arm   . Neuromuscular disorder ([place])    neuropathy  feet and hands from chemo tx  . Pneumonia   . Shingles   . Vertigo    last episode around Jan 2018    Past Surgical History:  Procedure Laterality Date  . ABDOMINAL HYSTERECTOMY  1973  . APPENDECTOMY  1969  . CATARACT EXTRACTION W/PHACO Left 07/11/2015   Procedure: CATARACT EXTRACTION PHACO AND INTRAOCULAR LENS PLACEMENT ([place]) left eye;  Surgeon: Leandrew Koyanagi, MD;  Location: [facility];  Service: Ophthalmology;  Laterality: Left;  . CATARACT EXTRACTION W/PHACO Right 08/27/2016   Procedure: CATARACT EXTRACTION PHACO AND INTRAOCULAR LENS PLACEMENT (IOC);  Surgeon: Leandrew Koyanagi, MD;  Location: [facility];  Service: Ophthalmology;  Laterality: Right;  IVA TOPICAL RIGHT  . CESAREAN SECTION    . COLON RESECTION    . COLONOSCOPY  2004  . COLONOSCOPY N/A 04/04/2016   Procedure: COLONOSCOPY;  Surgeon: Lollie Sails, MD;  Location: [facility] ENDOSCOPY;  Service: Endoscopy;  Laterality: N/A;  . COLONOSCOPY WITH PROPOFOL N/A 10/01/2018   Procedure: COLONOSCOPY WITH PROPOFOL;  Surgeon: [name], MD;  Location: [facility];  Service: Endoscopy;  Laterality: N/A;  . ESOPHAGOGASTRODUODENOSCOPY N/A 04/04/2016   Procedure: ESOPHAGOGASTRODUODENOSCOPY (EGD);   Surgeon: Lollie Sails, MD;  Location: [facility] ENDOSCOPY;  Service: Endoscopy;  Laterality: N/A;  . kidney stone removal     . MASTECTOMY Bilateral 2002,2010  . salpingo oophorectmy   1973    There were no vitals filed for this visit.  Subjective Assessment - 03/14/19 1610    Subjective   I think my sleeve do not fit - it pulls weird at the side and causing these indentions ,and then to large at the upper arm - it slides down          LYMPHEDEMA/ONCOLOGY QUESTIONNAIRE - 03/14/19 1612      Left Upper Extremity Lymphedema   15 cm Proximal to Olecranon Process  27 cm    10 cm Proximal to Olecranon Process  27.8 cm    Olecranon Process  23.5 cm    15 cm Proximal to Ulnar Styloid Process  22.5 cm    10 cm Proximal to Ulnar Styloid Process  19 cm    Just Proximal to Ulnar Styloid Process  14.2 cm         Pt arrive with her new Jobst Elvarex soft sleeve for L UE lymphedema   - after being fitted by [facility] about 2 wks ago Pt Jobst Elvarex sleeve to loose at top and seam puckers on ulnar wrist and upper arm - appear to pull sleeve crooked  Upper arm also to loose -and pt 's mid upper arm measurement increase 0.8 cm  Contacted [facility] to get measure  again                          Patient will benefit from skilled therapeutic intervention in order to improve the following deficits and impairments:           Visit Diagnosis: Postmastectomy lymphedema syndrome    Problem List Patient Active Problem List   Diagnosis Date Noted  . Personal history of colon cancer   . Preventative health care 04/01/2018  . Aortic atherosclerosis ([place]) 04/01/2018  . Chronic renal disease, stage III 04/01/2018  . Advance directive discussed with patient 04/01/2018  . Elevated tumor markers 11/15/2017  . Thrombocytopenia ([place]) 08/27/2017  . Malnutrition of mild degree ([place]) 03/19/2017  . Lymphedema of left upper extremity 02/23/2017  . B12 deficiency  02/10/2016  . History of left breast cancer 09/17/2015  . Cancer of ascending colon ([place]) 09/17/2015  . Leukopenia 09/17/2015  . Ankle edema, bilateral 09/16/2015  . Cyst of pituitary gland ([place]) 11/17/2014  . Absolute anemia 11/08/2014  . Neutropenia ([place]) 11/08/2014  . Peripheral neuropathy due to chemotherapy ([place]) 08/10/2014  . Chronic RLQ pain 08/10/2014  . Dyspepsia 08/10/2014  . Allergic rhinitis 01/29/2005    Rosalyn Gess OTR/L,CLT 03/14/2019, 4:13 PM  [facility] [address] [place], [address] Phone: [phone]   Fax:  [phone]  Name: Amy Bird MRN: [id number] Date of Birth: September 02, 1950

## 2019-04-04 ENCOUNTER — Telehealth: Payer: Self-pay

## 2019-04-04 NOTE — Telephone Encounter (Signed)
Let her know she had extensive labs in January from the oncologist. I am not sure she needs any other labs. If we decide that we have to check anything else, we can do it  that day (she won't need to be fasting)

## 2019-04-04 NOTE — Telephone Encounter (Signed)
Pt left v/m that she has annual exam scheduled for 04/07/19 at 3 PM and pt wants to know if needs labs prior to appt. Pt would like to have lab testing done. Pt request cb.

## 2019-04-04 NOTE — Telephone Encounter (Signed)
Spoke to pt

## 2019-04-07 ENCOUNTER — Encounter: Payer: Self-pay | Admitting: Internal Medicine

## 2019-04-07 ENCOUNTER — Other Ambulatory Visit: Payer: Self-pay

## 2019-04-07 ENCOUNTER — Ambulatory Visit (INDEPENDENT_AMBULATORY_CARE_PROVIDER_SITE_OTHER): Payer: Medicare PPO | Admitting: Internal Medicine

## 2019-04-07 VITALS — BP 106/66 | HR 120 | Temp 98.2°F | Ht 61.0 in | Wt 117.0 lb

## 2019-04-07 DIAGNOSIS — T451X5A Adverse effect of antineoplastic and immunosuppressive drugs, initial encounter: Secondary | ICD-10-CM | POA: Diagnosis not present

## 2019-04-07 DIAGNOSIS — Z7189 Other specified counseling: Secondary | ICD-10-CM | POA: Diagnosis not present

## 2019-04-07 DIAGNOSIS — G620 Drug-induced polyneuropathy: Secondary | ICD-10-CM

## 2019-04-07 DIAGNOSIS — D696 Thrombocytopenia, unspecified: Secondary | ICD-10-CM

## 2019-04-07 DIAGNOSIS — I700 Atherosclerosis of aorta: Secondary | ICD-10-CM | POA: Diagnosis not present

## 2019-04-07 DIAGNOSIS — Z85038 Personal history of other malignant neoplasm of large intestine: Secondary | ICD-10-CM | POA: Diagnosis not present

## 2019-04-07 DIAGNOSIS — D709 Neutropenia, unspecified: Secondary | ICD-10-CM | POA: Diagnosis not present

## 2019-04-07 DIAGNOSIS — Z853 Personal history of malignant neoplasm of breast: Secondary | ICD-10-CM

## 2019-04-07 DIAGNOSIS — Z0000 Encounter for general adult medical examination without abnormal findings: Secondary | ICD-10-CM

## 2019-04-07 NOTE — Assessment & Plan Note (Signed)
Chronic and stable No sig bleeding issues No action

## 2019-04-07 NOTE — Progress Notes (Signed)
Hearing Screening   Method: Audiometry   125Hz  250Hz  500Hz  1000Hz  2000Hz  3000Hz  4000Hz  6000Hz  8000Hz   Right ear:   0 40 40  0    Left ear:   40 40 40  40    Vision Screening Comments: December 2020

## 2019-04-07 NOTE — Assessment & Plan Note (Signed)
Followed by Dr Tasia Catchings

## 2019-04-07 NOTE — Assessment & Plan Note (Signed)
Bilateral mastectomy No mammograms

## 2019-04-07 NOTE — Assessment & Plan Note (Signed)
Will need colonoscopy again in 2025

## 2019-04-07 NOTE — Progress Notes (Signed)
Subjective:    Patient ID: Amy Bird, female    DOB: December 21, 1950, 69 y.o.   MRN: [id number]  HPI Here for Medicare wellness visit and follow up of chronic health conditions This visit occurred during the SARS-CoV-2 public health emergency.  Safety protocols were in place, including screening questions prior to the visit, additional usage of staff PPE, and extensive cleaning of exam room while observing appropriate contact time as indicated for disinfecting solutions.   Reviewed form and advanced directives Reviewed other doctors No alcohol or tobacco Does regular stretching and some aerobics Vision and hearing are okay No falls No depression or anhedonia Independent with instrumental ADLs No sig memory issues  Did have colonoscopy No evidence of recurrent cancer Will need it again in 5 years  Chronic low platelets---no abnormal bleeding Always bruising easy White count chronically low as well---stable  Chronic neuropathy in hands and feet Mostly numbness--pain not an issue  Known aortic atherosclerosis on imaging Seems to be low risk in general No chest pain No SOB No dizziness or syncope  Current Outpatient Medications on File Prior to Visit  Medication Sig Dispense Refill  . Calcium Carb-Cholecalciferol (CALCIUM 600 + D PO) Take 1 tablet by mouth daily.     . cholecalciferol (VITAMIN D3) 25 MCG (1000 UT) tablet Take 1,000 Units by mouth daily.    Marland Kitchen latanoprost (XALATAN) 0.005 % ophthalmic solution Place 1 drop into both eyes at bedtime.    . vitamin B-12 (CYANOCOBALAMIN) 1000 MCG tablet Take 1,000 mcg by mouth daily.    . vitamin C (ASCORBIC ACID) 500 MG tablet Take 500 mg by mouth daily.     No current facility-administered medications on file prior to visit.    Allergies  Allergen Reactions  . Hydrocodone-Acetaminophen Other (See Comments) and Shortness Of Breath    Not witnessed info gathered as part of patient screening.  PT states her chest felt very heavy  and difficulty breathing.  . Sulfa Antibiotics Swelling  . Cefuroxime Diarrhea  . Hydrocodone-Ibuprofen Other (See Comments)    Patient feels like there is a heavy wight on her chest  . Levofloxacin Hives  . Statins Other (See Comments)    Leg cramps and groin pain  . Other Itching    Tree Nuts  . Tape Rash  . Tree Extract Itching    Past Medical History:  Diagnosis Date  . Anemia   . Arthritis   . Breast cancer, left breast ([place]) 2002   Left Mastectomy, chemo + Rad tx's.  . Colon cancer ([place]) 10/2012   Partial colon resection and chemo tx's.  . Diverticulitis   . Glaucoma   . Hearing loss   . Hyperlipidemia 20 years  . Kidney stone   . Lymphedema of left arm   . Neuromuscular disorder ([place])    neuropathy  feet and hands from chemo tx  . Pneumonia   . Shingles   . Vertigo    last episode around Jan 2018    Past Surgical History:  Procedure Laterality Date  . ABDOMINAL HYSTERECTOMY  1973  . APPENDECTOMY  1969  . CATARACT EXTRACTION W/PHACO Left 07/11/2015   Procedure: CATARACT EXTRACTION PHACO AND INTRAOCULAR LENS PLACEMENT ([place]) left eye;  Surgeon: Leandrew Koyanagi, MD;  Location: [facility];  Service: Ophthalmology;  Laterality: Left;  . CATARACT EXTRACTION W/PHACO Right 08/27/2016   Procedure: CATARACT EXTRACTION PHACO AND INTRAOCULAR LENS PLACEMENT (IOC);  Surgeon: Leandrew Koyanagi, MD;  Location: [facility];  Service:  Ophthalmology;  Laterality: Right;  IVA TOPICAL RIGHT  . CESAREAN SECTION    . COLON RESECTION    . COLONOSCOPY  2004  . COLONOSCOPY N/A 04/04/2016   Procedure: COLONOSCOPY;  Surgeon: Lollie Sails, MD;  Location: [facility] ENDOSCOPY;  Service: Endoscopy;  Laterality: N/A;  . COLONOSCOPY WITH PROPOFOL N/A 10/01/2018   Procedure: COLONOSCOPY WITH PROPOFOL;  Surgeon: [name], MD;  Location: [facility];  Service: Endoscopy;  Laterality: N/A;  . ESOPHAGOGASTRODUODENOSCOPY N/A 04/04/2016   Procedure:  ESOPHAGOGASTRODUODENOSCOPY (EGD);  Surgeon: Lollie Sails, MD;  Location: [facility] ENDOSCOPY;  Service: Endoscopy;  Laterality: N/A;  . kidney stone removal     . MASTECTOMY Bilateral 2002,2010  . salpingo oophorectmy   1973    Family History  Problem Relation Age of Onset  . Hypertension Mother   . Alzheimer's disease Mother   . Cancer Father        prostate  . Prostate cancer Father   . Cancer Sister        breast  . Hypertension Sister   . Cancer Brother        stomach  . Hypertension Brother   . Sarcoidosis Other        positive family history.  . Cancer Sister        breast  . Cancer Sister        breast    Social History   Socioeconomic History  . Marital status: Divorced    Spouse name: Not on file  . Number of children: 2  . Years of education: Not on file  . Highest education level: Not on file  Occupational History  . Occupation: Banker work    Comment: [employer]--part time  Tobacco Use  . Smoking status: Former Smoker    Packs/day: 1.50    Years: 35.00    Pack years: 52.50    Types: Cigarettes    Quit date: 2002    Years since quitting: 19.2  . Smokeless tobacco: Never Used  Substance and Sexual Activity  . Alcohol use: No  . Drug use: No  . Sexual activity: Not on file  Other Topics Concern  . Not on file  Social History Narrative   Lives alone.   Daughter died   Son lives nearby      No living will   Would want son as health care POA. Alternate would be sister Mardene Celeste   Would accept resuscitation attempts   Not sure about tube feeds   Social Determinants of Health   Financial Resource Strain:   . Difficulty of Paying Living Expenses:   Food Insecurity:   . Worried About Charity fundraiser in the Last Year:   . Arboriculturist in the Last Year:   Transportation Needs:   . Film/video editor (Medical):   Marland Kitchen Lack of Transportation (Non-Medical):   Physical Activity:   . Days of Exercise per Week:   . Minutes of Exercise per  Session:   Stress:   . Feeling of Stress :   Social Connections:   . Frequency of Communication with Friends and Family:   . Frequency of Social Gatherings with Friends and Family:   . Attends Religious Services:   . Active Member of Clubs or Organizations:   . Attends Archivist Meetings:   Marland Kitchen Marital Status:   Intimate Partner Violence:   . Fear of Current or Ex-Partner:   . Emotionally Abused:   Marland Kitchen Physically  Abused:   . Sexually Abused:    Review of Systems Appetite is fine Weight is back up some---worked with dietician Sleeps is variable--overall satisfied Wears seat belt Working with dentist to keep teeth No skin rash or suspicious lesions No heartburn or dysphagia Bowels move fine--no blood No urinary problems No sig back or joint pains    Objective:   Physical Exam  Constitutional: She is oriented to person, place, and time. She appears well-developed. No distress.  HENT:  Mouth/Throat: Oropharynx is clear and moist. No oropharyngeal exudate.  No oral lesions  Neck: No thyromegaly present.  Cardiovascular: Normal rate, regular rhythm, normal heart sounds and intact distal pulses. Exam reveals no gallop.  No murmur heard. Respiratory: Effort normal and breath sounds normal. No respiratory distress. She has no wheezes. She has no rales.  GI: Soft. There is no abdominal tenderness.  Musculoskeletal:        General: No tenderness or edema.  Lymphadenopathy:    She has no cervical adenopathy.  Neurological: She is alert and oriented to person, place, and time.  President---"Biden, Trump, Obama" [id number] Recall 3/3  Skin: No rash noted. No erythema.  Psychiatric: She has a normal mood and affect. Her behavior is normal.           Assessment & Plan:

## 2019-04-07 NOTE — Assessment & Plan Note (Signed)
See social history 

## 2019-04-07 NOTE — Assessment & Plan Note (Signed)
Mostly numbness No sig pain--so no Rx

## 2019-04-07 NOTE — Assessment & Plan Note (Signed)
Seen on imaging Low ischemic risk so no statin

## 2019-04-07 NOTE — Assessment & Plan Note (Signed)
I have personally reviewed the Medicare Annual Wellness questionnaire and have noted 1. The patient's medical and social history 2. Their use of alcohol, tobacco or illicit drugs 3. Their current medications and supplements 4. The patient's functional ability including ADL's, fall risks, home safety risks and hearing or visual             impairment. 5. Diet and physical activities 6. Evidence for depression or mood disorders  The patients weight, height, BMI and visual acuity have been recorded in the chart I have made referrals, counseling and provided education to the patient based review of the above and I have provided the pt with a written personalized care plan for preventive services.  I have provided you with a copy of your personalized plan for preventive services. Please take the time to review along with your updated medication list.  Doing well Discussed exercise Consider shingrix Please get the COVID vaccine Yearly flu vaccine Consider shingrix

## 2019-04-19 IMAGING — CT NM PET TUM IMG RESTAG (PS) SKULL BASE T - THIGH
10 series · 23 of 25 positions shown · non-contrast
Comparison: 02/15/2016

CLINICAL DATA: Subsequent treatment strategy for colon and breast
cancer..

EXAM:
NUCLEAR MEDICINE PET SKULL BASE TO THIGH
TECHNIQUE: 5.6 mCi F-18 FDG was injected intravenously. Full-ring PET imaging
was performed from the skull base to thigh after the radiotracer. CT
data was obtained and used for attenuation correction and anatomic
localization.
Fasting blood glucose: 84 mg/dl

[Series 2: ac_ct · axial · 5.0mm · 1.37mm/px · z∈[-1024,-274]mm · 4 of 251 slices shown]
[im 1/251]
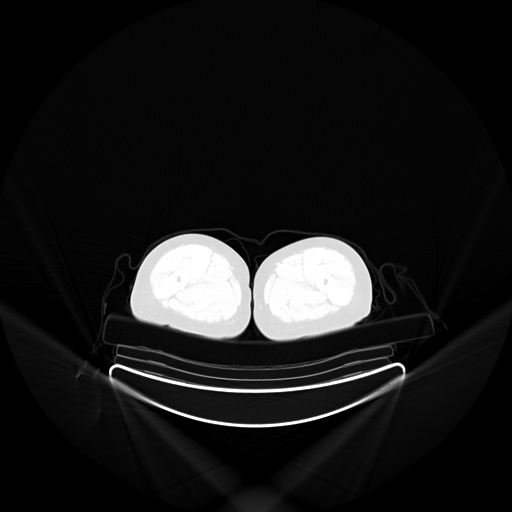
[im 84/251]
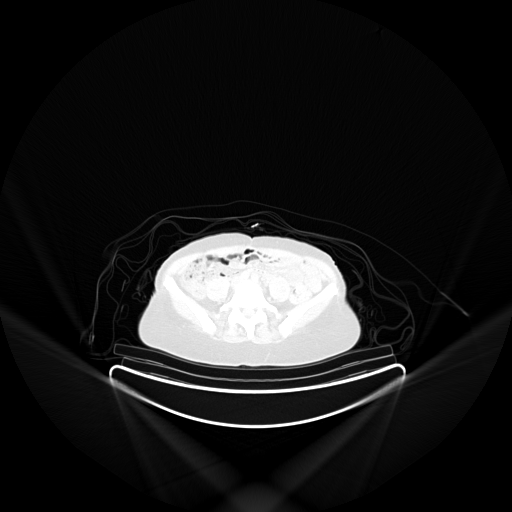
[im 167/251]
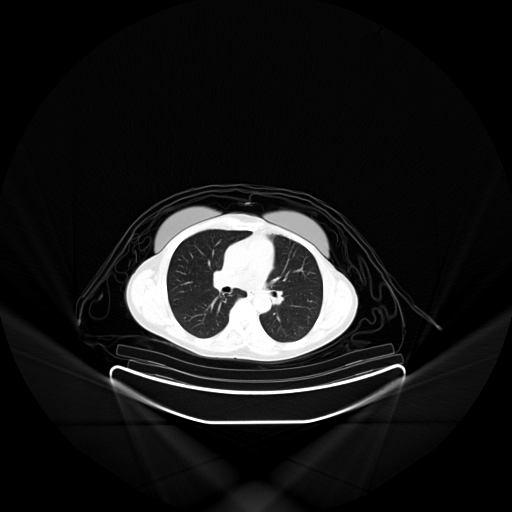
[im 251/251]
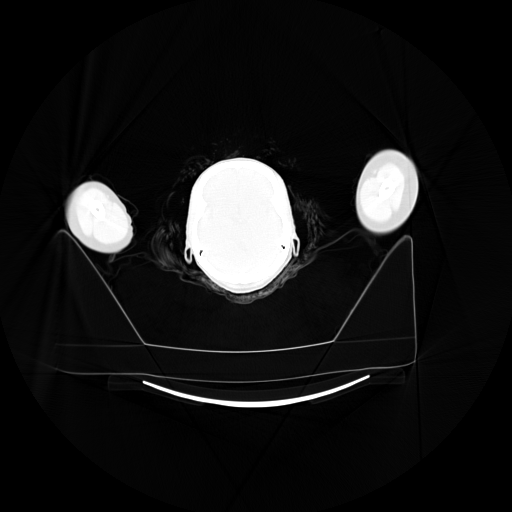

[Series 3: pet wb (ac) · axial · 5.0mm · 2.72mm/px · z∈[-1024,-648]mm · 2 of 251 slices shown]
[im 1/251]
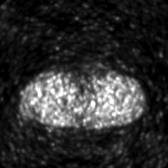
[im 126/251]
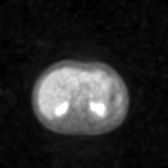

[Series 4: ct wb 5.0 b30f · axial · 5.0mm · 0.98mm/px · z∈[-1024,-274]mm · 3 of 251 slices shown]
[im 1/251]
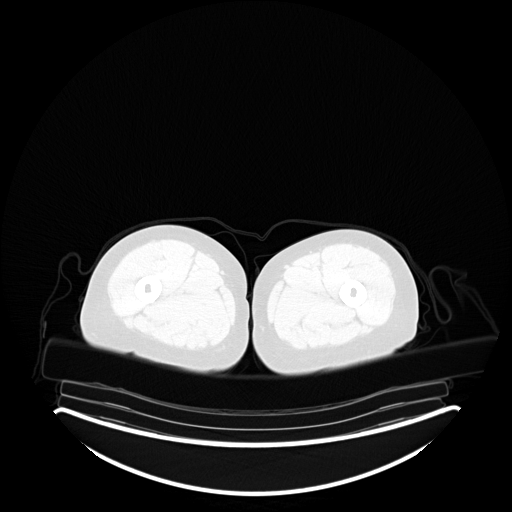
[im 126/251]
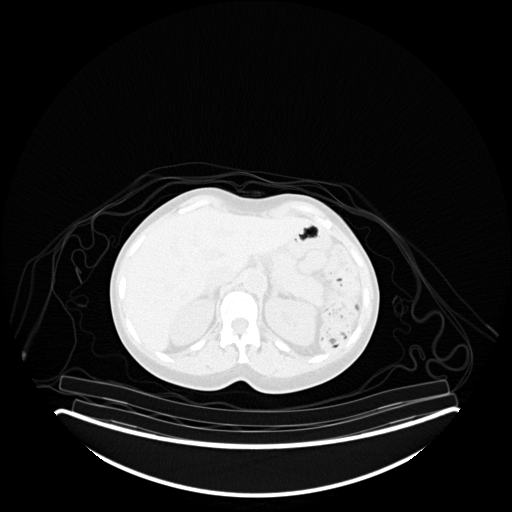
[im 251/251  brain]
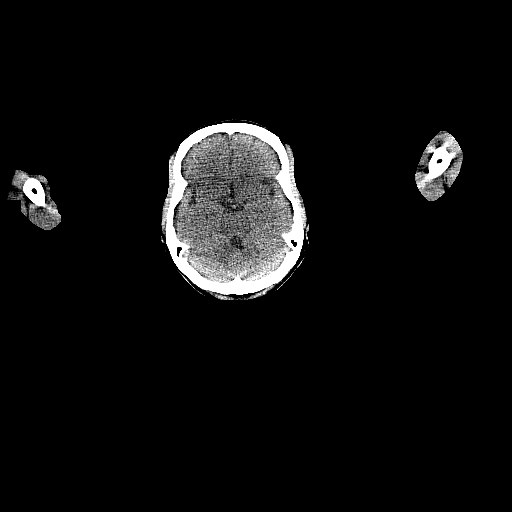

[Series 5: pet wb uncorrected (nac) · axial · 5.0mm · 4.07mm/px · z∈[-1024,-274]mm · 3 of 251 slices shown]
[im 1/251]
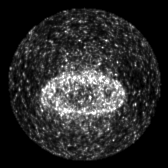
[im 126/251]
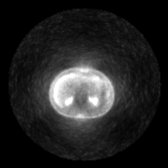
[im 251/251]
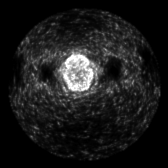

[Series 603: pet axial fused · 3 of 249 slices shown]
[im 1/249]
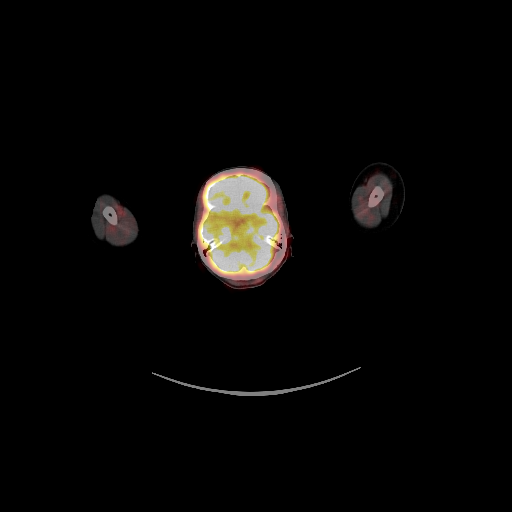
[im 125/249]
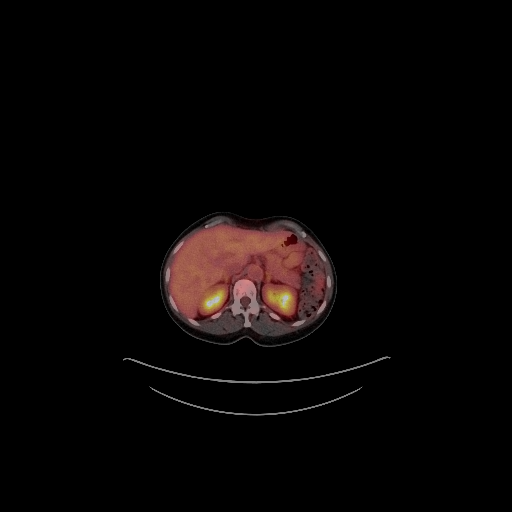
[im 249/249]
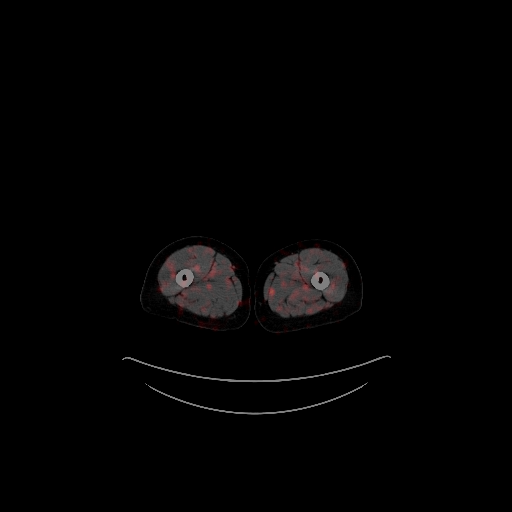

[Series 604: pet coronal fused · 1 of 85 slices shown]
[im 1/85]
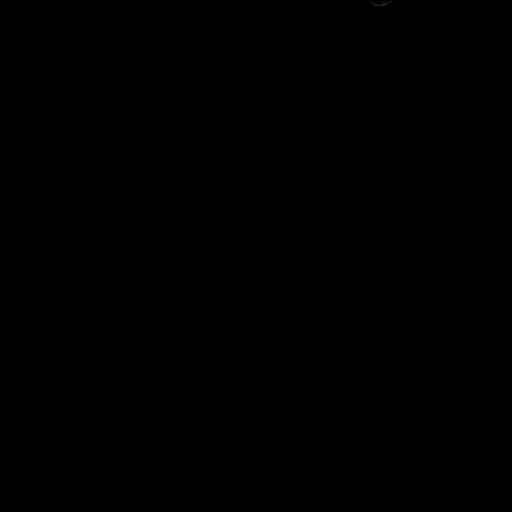

[Series 605: pet sagittal fused · 1 of 147 slices shown]
[im 1/147]
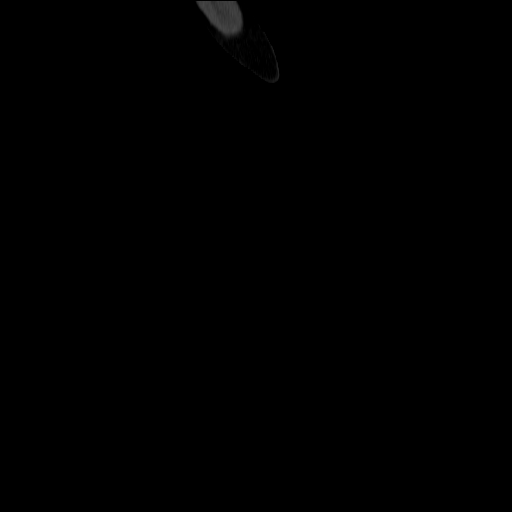

[Series 606: pet axial · 3 of 251 slices shown]
[im 1/251]
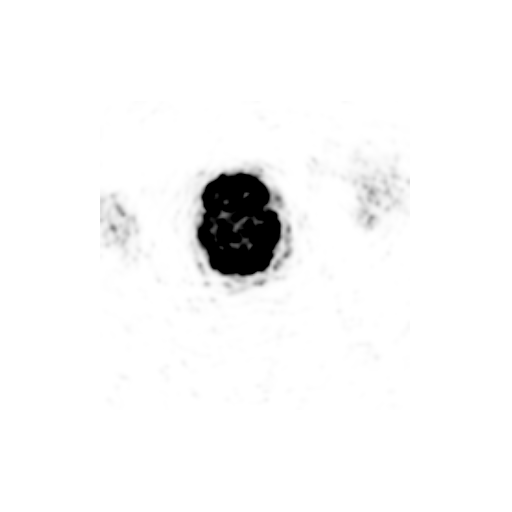
[im 126/251]
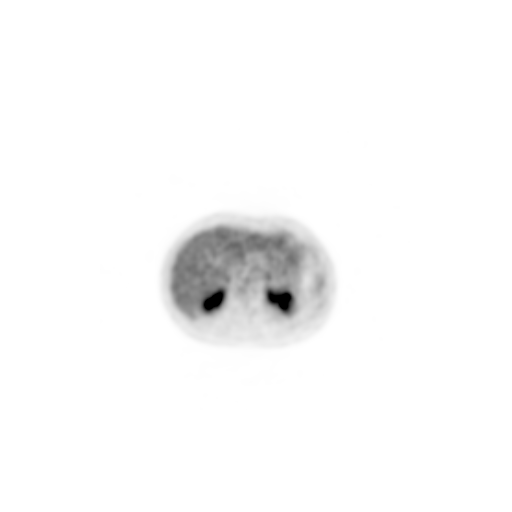
[im 251/251]
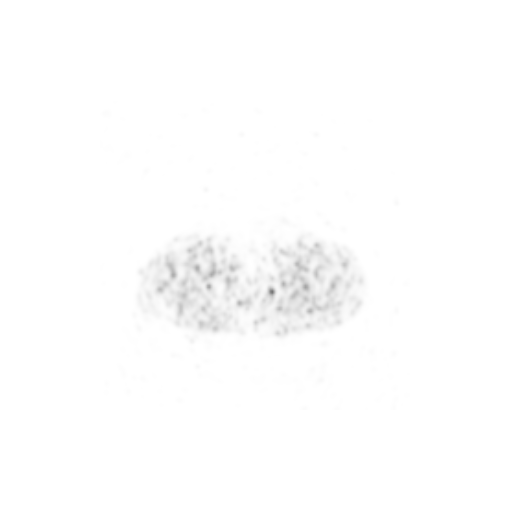

[Series 607: pet coronal · 1 of 101 slices shown]
[im 1/101]
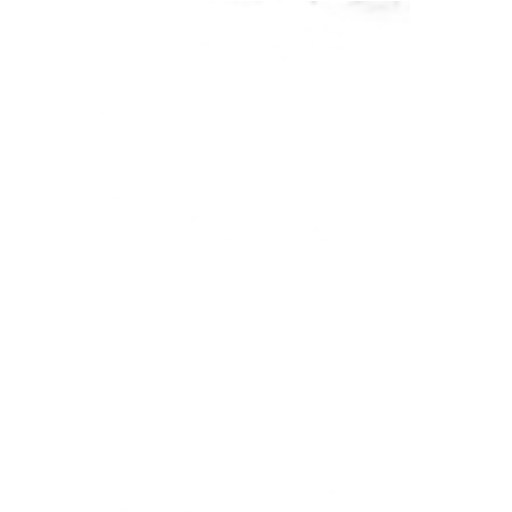

[Series 608: pet sagittal · 2 of 147 slices shown]
[im 1/147]
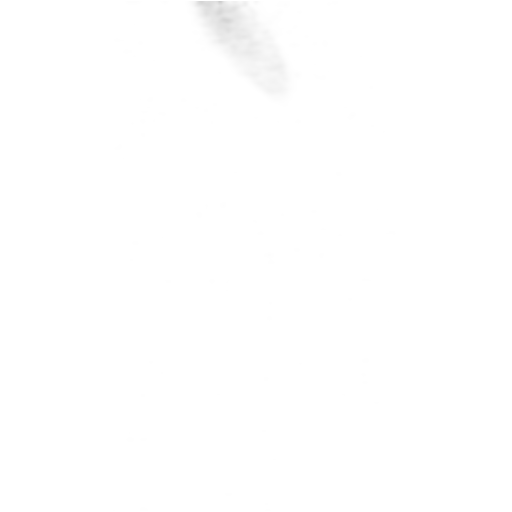
[im 147/147]
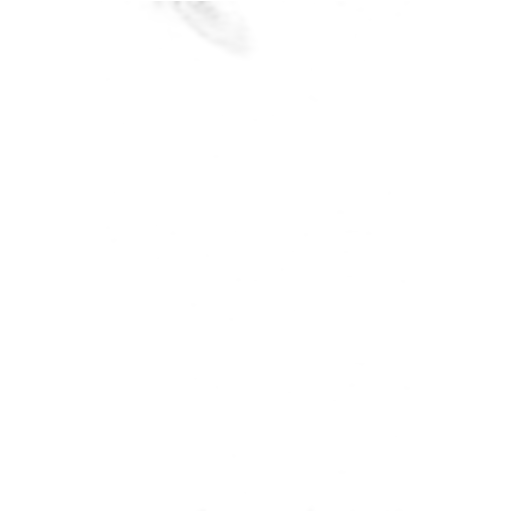

[23 of 25 positions shown; findings below may reference images not displayed]

FINDINGS: Mediastinal blood pool activity: SUV max

NECK: No hypermetabolic lymph nodes in the neck.

Incidental CT findings: none

CHEST: No hypermetabolic mediastinal or hilar nodes. No suspicious
pulmonary nodules on the CT scan.

Incidental CT findings: Similar appearance of pleuroparenchymal
scarring within the left apex. Aortic atherosclerosis noted. Left
axillary nodal dissection. Bilateral mastectomy.

ABDOMEN/PELVIS: No abnormal hypermetabolic activity within the
liver, pancreas, adrenal glands, or spleen. No hypermetabolic lymph
nodes in the abdomen or pelvis.

Incidental CT findings: Mild aortic atherosclerosis.

SKELETON: No focal hypermetabolic activity to suggest skeletal
metastasis.

Incidental CT findings: none
IMPRESSION: 1. No hypermetabolic residual or recurrent disease.
2. Status post bilateral mastectomy and left axillary node
dissection.
3.  Aortic Atherosclerosis ([id number]).

## 2019-05-02 ENCOUNTER — Other Ambulatory Visit: Payer: Self-pay

## 2019-05-02 ENCOUNTER — Inpatient Hospital Stay: Payer: Medicare PPO | Attending: Oncology

## 2019-05-02 DIAGNOSIS — C182 Malignant neoplasm of ascending colon: Secondary | ICD-10-CM | POA: Diagnosis not present

## 2019-05-02 DIAGNOSIS — E538 Deficiency of other specified B group vitamins: Secondary | ICD-10-CM | POA: Diagnosis not present

## 2019-05-02 DIAGNOSIS — I890 Lymphedema, not elsewhere classified: Secondary | ICD-10-CM

## 2019-05-02 DIAGNOSIS — D72819 Decreased white blood cell count, unspecified: Secondary | ICD-10-CM

## 2019-05-02 LAB — CBC WITH DIFFERENTIAL/PLATELET
Abs Immature Granulocytes: 0.01 10*3/uL (ref 0.00–0.07)
Basophils Absolute: 0 10*3/uL (ref 0.0–0.1)
Basophils Relative: 0 %
Eosinophils Absolute: 0 10*3/uL (ref 0.0–0.5)
Eosinophils Relative: 0 %
HCT: 38.8 % (ref 36.0–46.0)
Hemoglobin: 13 g/dL (ref 12.0–15.0)
Immature Granulocytes: 0 %
Lymphocytes Relative: 39 %
Lymphs Abs: 1.3 10*3/uL (ref 0.7–4.0)
MCH: 32.5 pg (ref 26.0–34.0)
MCHC: 33.5 g/dL (ref 30.0–36.0)
MCV: 97 fL (ref 80.0–100.0)
Monocytes Absolute: 0.2 10*3/uL (ref 0.1–1.0)
Monocytes Relative: 7 %
Neutro Abs: 1.7 10*3/uL (ref 1.7–7.7)
Neutrophils Relative %: 54 %
Platelets: 139 10*3/uL — ABNORMAL LOW (ref 150–400)
RBC: 4 MIL/uL (ref 3.87–5.11)
RDW: 12.4 % (ref 11.5–15.5)
WBC: 3.3 10*3/uL — ABNORMAL LOW (ref 4.0–10.5)
nRBC: 0 % (ref 0.0–0.2)

## 2019-05-02 LAB — COMPREHENSIVE METABOLIC PANEL
ALT: 12 U/L (ref 0–44)
AST: 20 U/L (ref 15–41)
Albumin: 3.7 g/dL (ref 3.5–5.0)
Alkaline Phosphatase: 60 U/L (ref 38–126)
Anion gap: 8 (ref 5–15)
BUN: 12 mg/dL (ref 8–23)
CO2: 29 mmol/L (ref 22–32)
Calcium: 9.3 mg/dL (ref 8.9–10.3)
Chloride: 104 mmol/L (ref 98–111)
Creatinine, Ser: 0.9 mg/dL (ref 0.44–1.00)
GFR calc Af Amer: >60 mL/min (ref >60)
GFR calc non Af Amer: >60 mL/min (ref >60)
Glucose, Bld: 93 mg/dL (ref 70–99)
Potassium: 4 mmol/L (ref 3.5–5.1)
Sodium: 141 mmol/L (ref 135–145)
Total Bilirubin: 0.6 mg/dL (ref 0.3–1.2)
Total Protein: 7 g/dL (ref 6.5–8.1)

## 2019-05-02 LAB — VITAMIN B12: Vitamin B-12: 1949 pg/mL — ABNORMAL HIGH (ref 180–914)

## 2019-05-03 LAB — CEA: CEA: 7 ng/mL — ABNORMAL HIGH (ref 0.0–4.7)

## 2019-05-04 ENCOUNTER — Other Ambulatory Visit: Payer: Self-pay

## 2019-05-04 DIAGNOSIS — C182 Malignant neoplasm of ascending colon: Secondary | ICD-10-CM

## 2019-05-05 ENCOUNTER — Ambulatory Visit: Payer: Medicare PPO | Attending: Internal Medicine | Admitting: Occupational Therapy

## 2019-05-05 ENCOUNTER — Other Ambulatory Visit: Payer: Self-pay

## 2019-05-05 DIAGNOSIS — I972 Postmastectomy lymphedema syndrome: Secondary | ICD-10-CM

## 2019-05-05 NOTE — Therapy (Signed)
[facility] [address] [place], [address] Phone: [phone]   Fax:  [phone]  Occupational Therapy Screen   Patient Details  Name: Amy Bird MRN: [id number] Date of Birth: [birth year] No data recorded  Encounter Date: 05/05/2019  OT End of Session - 05/05/19 1611    Visit Number  0       Past Medical History:  Diagnosis Date  . Anemia   . Arthritis   . Breast cancer, left breast ([place]) 2002   Left Mastectomy, chemo + Rad tx's.  . Colon cancer ([place]) 10/2012   Partial colon resection and chemo tx's.  . Diverticulitis   . Glaucoma   . Hearing loss   . Hyperlipidemia 20 years  . Kidney stone   . Lymphedema of left arm   . Neuromuscular disorder ([place])    neuropathy  feet and hands from chemo tx  . Pneumonia   . Shingles   . Vertigo    last episode around Jan 2018    Past Surgical History:  Procedure Laterality Date  . ABDOMINAL HYSTERECTOMY  1973  . APPENDECTOMY  1969  . CATARACT EXTRACTION W/PHACO Left 07/11/2015   Procedure: CATARACT EXTRACTION PHACO AND INTRAOCULAR LENS PLACEMENT ([place]) left eye;  Surgeon: Leandrew Koyanagi, MD;  Location: [facility];  Service: Ophthalmology;  Laterality: Left;  . CATARACT EXTRACTION W/PHACO Right 08/27/2016   Procedure: CATARACT EXTRACTION PHACO AND INTRAOCULAR LENS PLACEMENT (IOC);  Surgeon: Leandrew Koyanagi, MD;  Location: [facility];  Service: Ophthalmology;  Laterality: Right;  IVA TOPICAL RIGHT  . CESAREAN SECTION    . COLON RESECTION    . COLONOSCOPY  2004  . COLONOSCOPY N/A 04/04/2016   Procedure: COLONOSCOPY;  Surgeon: Lollie Sails, MD;  Location: [facility] ENDOSCOPY;  Service: Endoscopy;  Laterality: N/A;  . COLONOSCOPY WITH PROPOFOL N/A 10/01/2018   Procedure: COLONOSCOPY WITH PROPOFOL;  Surgeon: [name], MD;  Location: [facility];  Service: Endoscopy;  Laterality: N/A;  . ESOPHAGOGASTRODUODENOSCOPY N/A  04/04/2016   Procedure: ESOPHAGOGASTRODUODENOSCOPY (EGD);  Surgeon: Lollie Sails, MD;  Location: [facility] ENDOSCOPY;  Service: Endoscopy;  Laterality: N/A;  . kidney stone removal     . MASTECTOMY Bilateral 2002,2010  . salpingo oophorectmy   1973    There were no vitals filed for this visit.  Subjective Assessment - 05/05/19 1610    Subjective   They made me 2 of the sleeves that we had me in and that works for my lymphedema and that I like - but they were both not fitting , bubbled at the seam on upper arm - so the Rep measured me for this one - it is tomorrow week since I started wearing it          LYMPHEDEMA/ONCOLOGY QUESTIONNAIRE - 05/05/19 1509      Left Upper Extremity Lymphedema   15 cm Proximal to Olecranon Process  27.3 cm    10 cm Proximal to Olecranon Process  27.8 cm    Olecranon Process  23.4 cm    15 cm Proximal to Ulnar Styloid Process  22.2 cm    10 cm Proximal to Ulnar Styloid Process  19 cm    Just Proximal to Ulnar Styloid Process  14.2 cm      Since in Febr pt was fitted with 2 new Jobst Elvarex soft sleeves - but both bubbled at upper arm sleeve - she did not even bring 2nd one in for  me to look at - she told Rep it will not work the 2nd one   Pt was fitted with Rep from [place] with new brand  [employer] - it started bubbling little at wrist at seam -but not that it bothers her - fit is good And the containment works well - her measurements same that Febr - do have narrow band but staying up - size and length looks good  She do have little harder time getting wrist over the hand - told pt to contact [facility] if she had hard time and it bothers her R hand - to go get slippy gator sleeve to help her with ease to donn compression sleeve                                Patient will benefit from skilled therapeutic intervention in order to improve the following deficits and impairments:           Visit Diagnosis: Postmastectomy  lymphedema syndrome    Problem List Patient Active Problem List   Diagnosis Date Noted  . Personal history of colon cancer   . Preventative health care 04/01/2018  . Aortic atherosclerosis ([place]) 04/01/2018  . Chronic renal disease, stage III 04/01/2018  . Advance directive discussed with patient 04/01/2018  . Elevated tumor markers 11/15/2017  . Thrombocytopenia ([place]) 08/27/2017  . Lymphedema of left upper extremity 02/23/2017  . B12 deficiency 02/10/2016  . History of left breast cancer 09/17/2015  . Leukopenia 09/17/2015  . Absolute anemia 11/08/2014  . Neutropenia ([place]) 11/08/2014  . Peripheral neuropathy due to chemotherapy ([place]) 08/10/2014  . Dyspepsia 08/10/2014  . Allergic rhinitis 01/29/2005    Rosalyn Gess OTR/l,CLT 05/05/2019, 4:11 PM  [facility] [address], [place], [address] Phone: [phone]   Fax:  [phone]  Name: Amy Bird MRN: [id number] Date of Birth: [birth year]

## 2019-06-24 DIAGNOSIS — K5900 Constipation, unspecified: Secondary | ICD-10-CM | POA: Diagnosis not present

## 2019-06-24 DIAGNOSIS — Z85038 Personal history of other malignant neoplasm of large intestine: Secondary | ICD-10-CM | POA: Diagnosis not present

## 2019-07-07 DIAGNOSIS — H401131 Primary open-angle glaucoma, bilateral, mild stage: Secondary | ICD-10-CM | POA: Diagnosis not present

## 2019-08-01 ENCOUNTER — Other Ambulatory Visit: Payer: Medicare PPO

## 2019-08-01 ENCOUNTER — Ambulatory Visit: Payer: Medicare PPO | Admitting: Oncology

## 2019-08-05 ENCOUNTER — Encounter: Payer: Self-pay | Admitting: Oncology

## 2019-08-05 ENCOUNTER — Inpatient Hospital Stay: Payer: Medicare PPO | Admitting: Oncology

## 2019-08-05 ENCOUNTER — Inpatient Hospital Stay: Payer: Medicare PPO | Attending: Oncology

## 2019-08-05 ENCOUNTER — Other Ambulatory Visit: Payer: Self-pay

## 2019-08-05 VITALS — BP 114/83 | HR 105 | Temp 99.0°F | Wt 114.1 lb

## 2019-08-05 DIAGNOSIS — Z886 Allergy status to analgesic agent status: Secondary | ICD-10-CM | POA: Insufficient documentation

## 2019-08-05 DIAGNOSIS — R634 Abnormal weight loss: Secondary | ICD-10-CM | POA: Diagnosis not present

## 2019-08-05 DIAGNOSIS — Z87891 Personal history of nicotine dependence: Secondary | ICD-10-CM | POA: Insufficient documentation

## 2019-08-05 DIAGNOSIS — Z79899 Other long term (current) drug therapy: Secondary | ICD-10-CM | POA: Insufficient documentation

## 2019-08-05 DIAGNOSIS — Z8249 Family history of ischemic heart disease and other diseases of the circulatory system: Secondary | ICD-10-CM | POA: Diagnosis not present

## 2019-08-05 DIAGNOSIS — Z800 Family history of malignant neoplasm of digestive organs: Secondary | ICD-10-CM | POA: Diagnosis not present

## 2019-08-05 DIAGNOSIS — T451X5A Adverse effect of antineoplastic and immunosuppressive drugs, initial encounter: Secondary | ICD-10-CM | POA: Diagnosis not present

## 2019-08-05 DIAGNOSIS — K209 Esophagitis, unspecified without bleeding: Secondary | ICD-10-CM | POA: Diagnosis not present

## 2019-08-05 DIAGNOSIS — E538 Deficiency of other specified B group vitamins: Secondary | ICD-10-CM | POA: Insufficient documentation

## 2019-08-05 DIAGNOSIS — G620 Drug-induced polyneuropathy: Secondary | ICD-10-CM | POA: Insufficient documentation

## 2019-08-05 DIAGNOSIS — Z882 Allergy status to sulfonamides status: Secondary | ICD-10-CM | POA: Diagnosis not present

## 2019-08-05 DIAGNOSIS — C182 Malignant neoplasm of ascending colon: Secondary | ICD-10-CM | POA: Diagnosis not present

## 2019-08-05 DIAGNOSIS — Z853 Personal history of malignant neoplasm of breast: Secondary | ICD-10-CM | POA: Diagnosis not present

## 2019-08-05 DIAGNOSIS — Z885 Allergy status to narcotic agent status: Secondary | ICD-10-CM | POA: Insufficient documentation

## 2019-08-05 DIAGNOSIS — Z9013 Acquired absence of bilateral breasts and nipples: Secondary | ICD-10-CM | POA: Diagnosis not present

## 2019-08-05 DIAGNOSIS — Z818 Family history of other mental and behavioral disorders: Secondary | ICD-10-CM | POA: Insufficient documentation

## 2019-08-05 DIAGNOSIS — Z803 Family history of malignant neoplasm of breast: Secondary | ICD-10-CM | POA: Diagnosis not present

## 2019-08-05 DIAGNOSIS — Z881 Allergy status to other antibiotic agents status: Secondary | ICD-10-CM | POA: Insufficient documentation

## 2019-08-05 DIAGNOSIS — I890 Lymphedema, not elsewhere classified: Secondary | ICD-10-CM | POA: Diagnosis not present

## 2019-08-05 DIAGNOSIS — Z8042 Family history of malignant neoplasm of prostate: Secondary | ICD-10-CM | POA: Insufficient documentation

## 2019-08-05 DIAGNOSIS — Z9049 Acquired absence of other specified parts of digestive tract: Secondary | ICD-10-CM | POA: Insufficient documentation

## 2019-08-05 DIAGNOSIS — M5136 Other intervertebral disc degeneration, lumbar region: Secondary | ICD-10-CM | POA: Diagnosis not present

## 2019-08-05 DIAGNOSIS — Z888 Allergy status to other drugs, medicaments and biological substances status: Secondary | ICD-10-CM | POA: Diagnosis not present

## 2019-08-05 DIAGNOSIS — D61818 Other pancytopenia: Secondary | ICD-10-CM | POA: Insufficient documentation

## 2019-08-05 LAB — COMPREHENSIVE METABOLIC PANEL
ALT: 11 U/L (ref 0–44)
AST: 19 U/L (ref 15–41)
Albumin: 3.8 g/dL (ref 3.5–5.0)
Alkaline Phosphatase: 71 U/L (ref 38–126)
Anion gap: 8 (ref 5–15)
BUN: 14 mg/dL (ref 8–23)
CO2: 28 mmol/L (ref 22–32)
Calcium: 8.4 mg/dL — ABNORMAL LOW (ref 8.9–10.3)
Chloride: 101 mmol/L (ref 98–111)
Creatinine, Ser: 0.82 mg/dL (ref 0.44–1.00)
GFR calc Af Amer: >60 mL/min (ref >60)
GFR calc non Af Amer: >60 mL/min (ref >60)
Glucose, Bld: 116 mg/dL — ABNORMAL HIGH (ref 70–99)
Potassium: 4.2 mmol/L (ref 3.5–5.1)
Sodium: 137 mmol/L (ref 135–145)
Total Bilirubin: 0.7 mg/dL (ref 0.3–1.2)
Total Protein: 7.3 g/dL (ref 6.5–8.1)

## 2019-08-05 LAB — CBC WITH DIFFERENTIAL/PLATELET
Abs Immature Granulocytes: 0 10*3/uL (ref 0.00–0.07)
Basophils Absolute: 0 10*3/uL (ref 0.0–0.1)
Basophils Relative: 1 %
Eosinophils Absolute: 0 10*3/uL (ref 0.0–0.5)
Eosinophils Relative: 0 %
HCT: 37.7 % (ref 36.0–46.0)
Hemoglobin: 13 g/dL (ref 12.0–15.0)
Immature Granulocytes: 0 %
Lymphocytes Relative: 30 %
Lymphs Abs: 0.9 10*3/uL (ref 0.7–4.0)
MCH: 32.6 pg (ref 26.0–34.0)
MCHC: 34.5 g/dL (ref 30.0–36.0)
MCV: 94.5 fL (ref 80.0–100.0)
Monocytes Absolute: 0.2 10*3/uL (ref 0.1–1.0)
Monocytes Relative: 6 %
Neutro Abs: 2 10*3/uL (ref 1.7–7.7)
Neutrophils Relative %: 63 %
Platelets: 145 10*3/uL — ABNORMAL LOW (ref 150–400)
RBC: 3.99 MIL/uL (ref 3.87–5.11)
RDW: 12.4 % (ref 11.5–15.5)
WBC: 3.1 10*3/uL — ABNORMAL LOW (ref 4.0–10.5)
nRBC: 0 % (ref 0.0–0.2)

## 2019-08-05 NOTE — Progress Notes (Signed)
Patient here today for follow up on breast cancer. Patient reports some discomfort to breast at times.

## 2019-08-05 NOTE — Progress Notes (Signed)
[facility] day:  08/05/2019  Chief Complaint: Follow-up for breast cancer and colon cancer, pancytopenia   HPI  Amy Bird is a 69 y.o. female with a history of left breast cancer (2002), right sided colon cancer (2014), mild pancytopenia who is seen for 6 month assessment. Patient previously followed up with Dr. Mike Gip and switched care to me on 01/28/2019. Extensive previous medical records reviewed was performed by me  #Breast cancer Left breast cancer 2002, per note, underwent left mastectomy in March 2002. Pathology was not available. She received Adriamycin and Cytoxan [completed 07/2000/], followed by Herceptin and Taxotere in 08/2000 Patient did not receive any adjuvant hormone therapy.  She underwent a right-sided mastectomy in 02/2008  CA 27-29 has been followed was 40.9 on 06/06/2010, 42.6 on 02/04/2011, 35.3 on 03/31/2012, 38.1 on 11/09/2012, 52.3 on 09/17/2015, 50.5 on 10/01/2015, 47.8 on 02/11/2016, 36.1 on 06/20/2016, 36.6 on 10/20/2016, 42.6 on 02/23/2017, 44.6 on 03/26/2017, 37.7 on 05/22/2017, 47.1 on 08/25/2017, 45.7 on 12/28/2017, and 52.2 on 06/28/2018.  #Colon cancer, Diagnosed with colon cancer in 12/2022, underwent right hemicolectomy on 12/25/2022.  Pathology reviewed grade 2 Adenocarcinoma which extended through the muscularis propria and into the adjacent adipose.1/27 lymph nodes were positive.  Pathological stage [id number] Patient was treated with CALGB protocol with FOLFOX chemotherapy +/- celecoxib.  She withdrew from the study in 02/25/2013.  She described trouble with counts.  She received 6 cycles of FOLFOX (01/28/2013 - 04/29/2013).  Colonoscopy on 04/04/2016 revealed a 2 mm polyp in the distal sigmoid colon.  Pathology revealed a serrated polyp.  EGD on 04/04/2016 revealed LA grade A esophagitis, non-bleeding erosive gastropathy, gastritis, and duodenitis.  Gastric and GEJ pathology revealed moderate chronic  active inflammation, H pylori associated.  CEA has been followed was 3.8 on 12/09/2012, 6.3 on 01/27/2013, 10.6 on 05/26/2013, 5.9 on 09/22/2013, 7.1 on 05/10/2014, 7.0 on 11/08/2014, 5.8 on 05/08/2015, 7.2 on 09/17/2015, 5.9 on 10/01/2015, 6.5 on 02/11/2016, 6.9 on 10/20/2016, 7.6 on 02/23/2017, 7.3 on 08/25/2017, 6.7 on 12/28/2017, and 7.7 on 06/28/2018.  PET scan on 10/09/2015 revealed no evidence of recurrent disease.  Chest, abdomen, and pelvic CT scan on 02/15/2016 revealed no findings of recurrent malignancy.  There were radiation therapy related opacities in the left lung.  There was prominent stool throughout the colon.  There was lumbar degenerative disc disease.  PET scan on 09/11/2017 revealed no hypermetabolic residual or recurrent disease.  #Chronic history of pancytopenia since 10/2012. Bone marrow aspiration and biopsy in 10/2012 was negative for myelodysplastic syndrome.  #Weight loss, since discontinuation of antidepressants years ago.  Thyroid function has been checked which was normal. Labs all 02/26/2016 reviewed normal cortisol, sed rate, ANA.  HIV negative.   Left lower extremity duplex on 09/19/2015 revealed no evidence of DVT.  She had a left popliteal fossa Baker's cyst.  She has been diagnosed with H pylori (stool antigen + 05/17/2016) and treated x 3.  She has B12 deficiency.  B12 was 251 on 08/27/2017 and 760 on 09/24/2017.  Folate was 12.  She began oral B12 on 08/28/2017.  She has been off B12 x 1 month.  #Patient reports having bilateral lower extremity neuropathy.  She is not currently on any thing for neuropathy. Also she has not followed up with lymphedema clinic for a while.   INTERVAL HISTORY Amy Bird is a 69 y.o. female who has above history reviewed by me today presents for follow up visit for history  of breast cancer and colon cancer. Problems and complaints are listed below: Patient has no new complaints today.  She is doing well.  Denies any  blood in her stool, unintentional weight loss, night sweating, fever or chills.  Past Medical History:  Diagnosis Date  . Anemia   . Arthritis   . Breast cancer, left breast ([place]) 2002   Left Mastectomy, chemo + Rad tx's.  . Colon cancer ([place]) 10/2012   Partial colon resection and chemo tx's.  . Diverticulitis   . Glaucoma   . Hearing loss   . Hyperlipidemia 20 years  . Kidney stone   . Lymphedema of left arm   . Neuromuscular disorder ([place])    neuropathy  feet and hands from chemo tx  . Pneumonia   . Shingles   . Vertigo    last episode around Jan 2018    Past Surgical History:  Procedure Laterality Date  . ABDOMINAL HYSTERECTOMY  1973  . APPENDECTOMY  1969  . CATARACT EXTRACTION W/PHACO Left 07/11/2015   Procedure: CATARACT EXTRACTION PHACO AND INTRAOCULAR LENS PLACEMENT ([place]) left eye;  Surgeon: Leandrew Koyanagi, MD;  Location: [facility];  Service: Ophthalmology;  Laterality: Left;  . CATARACT EXTRACTION W/PHACO Right 08/27/2016   Procedure: CATARACT EXTRACTION PHACO AND INTRAOCULAR LENS PLACEMENT (I[facility]);  Surgeon: Leandrew Koyanagi, MD;  Location: [facility];  Service: Ophthalmology;  Laterality: Right;  IVA TOPICAL RIGHT  . CESAREAN SECTION    . COLON RESECTION    . COLONOSCOPY  2004  . COLONOSCOPY N/A 04/04/2016   Procedure: COLONOSCOPY;  Surgeon: Lollie Sails, MD;  Location: [facility] ENDOSCOPY;  Service: Endoscopy;  Laterality: N/A;  . COLONOSCOPY WITH PROPOFOL N/A 10/01/2018   Procedure: COLONOSCOPY WITH PROPOFOL;  Surgeon: [name], MD;  Location: [facility];  Service: Endoscopy;  Laterality: N/A;  . ESOPHAGOGASTRODUODENOSCOPY N/A 04/04/2016   Procedure: ESOPHAGOGASTRODUODENOSCOPY (EGD);  Surgeon: Lollie Sails, MD;  Location: [facility] ENDOSCOPY;  Service: Endoscopy;  Laterality: N/A;  . kidney stone removal     . MASTECTOMY Bilateral 2002,2010  . salpingo oophorectmy   1973    Family History  Problem Relation Age of Onset  .  Hypertension Mother   . Alzheimer's disease Mother   . Cancer Father        prostate  . Prostate cancer Father   . Cancer Sister        breast  . Hypertension Sister   . Cancer Brother        stomach  . Hypertension Brother   . Sarcoidosis Other        positive family history.  . Cancer Sister        breast  . Cancer Sister        breast    Social History:  reports that she quit smoking about 19 years ago. Her smoking use included cigarettes. She has a 52.50 pack-year smoking history. She has never used smokeless tobacco. She reports that she does not drink alcohol and does not use drugs.  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  She quit smoking in 2003.  She lives in [place].  She belongs to the [employer].  The patient is alone today.  Allergies:  Allergies  Allergen Reactions  . Hydrocodone-Acetaminophen Other (See Comments) and Shortness Of Breath    Not witnessed info gathered as part of patient screening.  PT states her chest felt very heavy and difficulty breathing.  . Sulfa Antibiotics Swelling  .  Cefuroxime Diarrhea  . Hydrocodone-Ibuprofen Other (See Comments)    Patient feels like there is a heavy wight on her chest  . Levofloxacin Hives  . Statins Other (See Comments)    Leg cramps and groin pain  . Other Itching    Tree Nuts  . Tape Rash  . Tree Extract Itching    Current Medications: Current Outpatient Medications  Medication Sig Dispense Refill  . Calcium Carb-Cholecalciferol (CALCIUM 600 + D PO) Take 1 tablet by mouth daily.     . cholecalciferol (VITAMIN D3) 25 MCG (1000 UT) tablet Take 1,000 Units by mouth daily.    Marland Kitchen latanoprost (XALATAN) 0.005 % ophthalmic solution Place 1 drop into both eyes at bedtime.    . vitamin B-12 (CYANOCOBALAMIN) 1000 MCG tablet Take 1,000 mcg by mouth daily.    . vitamin C (ASCORBIC ACID) 500 MG tablet Take 500 mg by mouth daily.     No current facility-administered medications for this visit.    Review of  Systems  Constitutional: Negative for appetite change, chills, fatigue and fever.  HENT:   Negative for hearing loss and voice change.   Eyes: Negative for eye problems.  Respiratory: Negative for chest tightness and cough.   Cardiovascular: Negative for chest pain.  Gastrointestinal: Negative for abdominal distention, abdominal pain and blood in stool.  Endocrine: Negative for hot flashes.  Genitourinary: Negative for difficulty urinating and frequency.   Musculoskeletal: Negative for arthralgias.       She has some left upper extremity edema, uses lymphedema sleeve.  Skin: Negative for itching and rash.  Neurological: Positive for numbness. Negative for extremity weakness.  Hematological: Negative for adenopathy.  Psychiatric/Behavioral: Negative for confusion.      Physical Exam:  There were no vitals taken for this visit. Physical Exam Constitutional:      General: She is not in acute distress.    Comments: Thin built, she walks independently  HENT:     Head: Normocephalic and atraumatic.     Nose: Nose normal.     Mouth/Throat:     Pharynx: No oropharyngeal exudate.  Eyes:     General: No scleral icterus.    Pupils: Pupils are equal, round, and reactive to light.  Cardiovascular:     Rate and Rhythm: Normal rate and regular rhythm.     Heart sounds: No murmur heard.   Pulmonary:     Effort: Pulmonary effort is normal. No respiratory distress.     Breath sounds: No rales.  Chest:     Chest wall: No tenderness.  Abdominal:     General: There is no distension.     Palpations: Abdomen is soft.     Tenderness: There is no abdominal tenderness.  Musculoskeletal:        General: Normal range of motion.     Cervical back: Normal range of motion and neck supple.     Comments: Left upper arm-lymphedema sleeve  Skin:    General: Skin is warm and dry.     Findings: No erythema.  Neurological:     Mental Status: She is alert and oriented to person, place, and time.   Psychiatric:        Mood and Affect: Affect normal.    Status post bilateral mastectomy, no palpable mass at mastectomy sites.  No palpable axillary lymph node adenopathy bilaterally.  Chronic left axillary scarring tissue.  Labs reviewed CBC Latest Ref Rng & Units 08/05/2019 05/02/2019 01/28/2019  WBC 4.0 - 10.5 K/uL  3.1(L) 3.3(L) 2.4(L)  Hemoglobin 12.0 - 15.0 g/dL 13.0 13.0 13.5  Hematocrit 36 - 46 % 37.7 38.8 41.2  Platelets 150 - 400 K/uL 145(L) 139(L) 145(L)   CMP Latest Ref Rng & Units 05/02/2019 01/28/2019 06/28/2018  Glucose 70 - 99 mg/dL 93 98 140(H)  BUN 8 - 23 mg/dL 12 13 15   Creatinine 0.44 - 1.00 mg/dL 0.90 0.76 0.86  Sodium 135 - 145 mmol/L 141 140 137  Potassium 3.5 - 5.1 mmol/L 4.0 4.0 4.0  Chloride 98 - 111 mmol/L 104 102 102  CO2 22 - 32 mmol/L 29 31 27   Calcium 8.9 - 10.3 mg/dL 9.3 9.4 8.6(L)  Total Protein 6.5 - 8.1 g/dL 7.0 7.8 7.4  Total Bilirubin 0.3 - 1.2 mg/dL 0.6 0.7 0.7  Alkaline Phos 38 - 126 U/L 60 70 67  AST 15 - 41 U/L 20 23 24   ALT 0 - 44 U/L 12 12 11    RADIOGRAPHIC STUDIES: I have personally reviewed the radiological images as listed and agreed with the findings in the report. No results found.   Assessment:  Amy Bird is a 69 y.o. female with a history of left breast cancer (2002), right sided stage III colon cancer (2014), and mild pancytopenia. 1. Cancer of ascending colon ([place])   2. History of left breast cancer   3. Lymphedema of left upper extremity    Labs are reviewed and discussed with patient.  #History of left breast cancer status post mastectomy bilaterally, adjuvant chemotherapy Clinically she is doing well.  Previous breast pathology was not available.  She is not on any adjuvant endocrine therapy. Labs are reviewed and discussed with patient. Physical examination showed no evidence of disease recurrence. Continue monitor clinically.  #History of ascending colon cancer, stage III, status post hemicolectomy and adjuvant  chemotherapy. CEA has been monitored.  Patient has a history of chronically mildly elevated CEA. Today CEA is pending   #Chronic pancytopenia including mild neutropenia with normal ANC, mild thrombocytopenia, no intervention for now.  Continue to monitor. #History of bilateral mastectomy.  History of left upper extremity lymphedema, s follow-up with lymphedema clinic.   #History of vitamin B12 deficiency, recent vitamin B12 level was 1949.  Patient has been temporarily off vitamin supplementation. Think is reasonable to restart vitamin B-12 500 MCG daily.  We will check a B12 level at the next visit.  6.   RTC in 1 year for MD assessment and labs (CBC with diff, CMP, CEA, CA27.29).   Earlie Server, MD  08/05/2019, 1:34 PM

## 2019-08-06 LAB — CEA: CEA: 7.4 ng/mL — ABNORMAL HIGH (ref 0.0–4.7)

## 2019-09-16 DIAGNOSIS — Z1152 Encounter for screening for COVID-19: Secondary | ICD-10-CM | POA: Diagnosis not present

## 2019-09-16 DIAGNOSIS — Z03818 Encounter for observation for suspected exposure to other biological agents ruled out: Secondary | ICD-10-CM | POA: Diagnosis not present

## 2019-09-22 ENCOUNTER — Telehealth: Payer: Self-pay

## 2019-09-22 DIAGNOSIS — R42 Dizziness and giddiness: Secondary | ICD-10-CM | POA: Diagnosis not present

## 2019-09-22 DIAGNOSIS — Z20822 Contact with and (suspected) exposure to covid-19: Secondary | ICD-10-CM | POA: Diagnosis not present

## 2019-09-22 NOTE — Telephone Encounter (Signed)
I spoke with pt and she went to [facility] in [place] and was advised pt had vertigo; advised pt to take med that was sent to pharmacy and FU with PCP if needed. FYI to Dr Silvio Pate.

## 2019-09-22 NOTE — Telephone Encounter (Signed)
Okay Please check on her next week to make sure she is better

## 2019-09-22 NOTE — Telephone Encounter (Signed)
[facility] - Client TELEPHONE ADVICE RECORD [facility] Patient Name: Amy Bird Gender: Female DOB: May 21, 1950 Age: 69 Y 19 M 19 D Return Phone Number: [phone] (Primary) Address: City/State/Zip: [place]  [address] Client [facility] - Client Client Site [place] - Day Physician Viviana Simpler- MD Contact Type Call Who Is Calling Patient / Member / Family / Caregiver Call Type Triage / Clinical Relationship To Patient Self Return Phone Number [phone] (Primary) Chief Complaint Dizziness Reason for Call Symptomatic / Request for Health Information Initial Comment Caller is dizzy and nauseous. [place] Not Listed Caller states she might go to [facility] ED Translation No Nurse Assessment Nurse: Hardin Negus, RN, Danae Chen Date/Time (Eastern Time): 09/22/2019 12:45:34 PM Confirm and document reason for call. If symptomatic, describe symptoms. ---Caller states she is dizzy and having nausea. Symptoms started this morning. Head congestion and fatigue started this morning also. No vomiting or diarrhea. No fever. Lightheaded. Denies room spinning. Feels better when laying down, symptoms worsen with standing. Had negative Covid test on Friday. Has the patient had close contact with a person known or suspected to have the novel coronavirus illness OR traveled / lives in area with major community spread (including international travel) in the last 14 days from the onset of symptoms? * If Asymptomatic, screen for exposure and travel within the last 14 days. ---No Does the patient have any new or worsening symptoms? ---Yes Will a triage be completed? ---Yes Related visit to physician within the last 2 weeks? ---No Does the PT have any chronic conditions? (i.e. diabetes, asthma, this includes High risk factors for pregnancy, etc.) ---Yes List chronic conditions. ---seasonal allergies/hayfever Is this  a behavioral health or substance abuse call? ---No Guidelines Guideline Title Affirmed Question Affirmed Notes Nurse Date/Time (Eastern Time) Dizziness - Lightheadedness SEVERE dizziness (e.g., unable to stand, requires support to walk, feels like passing out now) Hardin Negus, RN, Danae Chen 09/22/2019 12:48:37 PM PLEASE NOTE: All timestamps contained within this report are represented as [place] Standard Time. CONFIDENTIALTY NOTICE: This fax transmission is intended only for the addressee. It contains information that is legally privileged, confidential or otherwise protected from use or disclosure. If you are not the intended recipient, you are strictly prohibited from reviewing, disclosing, copying using or disseminating any of this information or taking any action in reliance on or regarding this information. If you have received this fax in error, please notify us immediately by telephone so that we can arrange for its return to [place]. Phone: [phone], Toll-Free: [phone], Fax: [phone] Page: 2 of 2 Call Id: [id number] [place]. Time Eilene Ghazi Time) Disposition Final User 09/22/2019 1:01:18 PM Go to ED Now (or PCP triage) Yes Hardin Negus, RN, Dierdre Highman Disagree/Comply Comply Caller Understands Yes PreDisposition InappropriateToAsk Care Advice Given Per Guideline GO TO ED NOW (OR PCP TRIAGE): * IF NO PCP (PRIMARY CARE PROVIDER) SECOND-LEVEL TRIAGE: You need to be seen within the next hour. Go to the [facility] at _____________ [place] as soon as you can. ANOTHER ADULT SHOULD DRIVE: * It is better and safer if another adult drives instead of you. BRING MEDICINES: CARE ADVICE given per Dizziness (Adult) guideline. * Please bring a list of your current medicines when you go to the Emergency Department (ER). * It is also a good idea to bring the pill bottles too. This will help the doctor to make certain you are taking the right medicines and the right dose. Comments User: Ferdinand Cava,  RN  Date/Time Eilene Ghazi Time): 09/22/2019 12:50:02 PM has been vaccinated for Covid User: Ferdinand Cava, RN Date/Time Eilene Ghazi Time): 09/22/2019 12:51:05 PM hx of breast and colon cancer- no current treatment User: Ferdinand Cava, RN Date/Time Eilene Ghazi Time): 09/22/2019 12:53:35 PM states she has neuropathy *had tooth removed on 8/23 User: Ferdinand Cava, RN Date/Time Eilene Ghazi Time): 09/22/2019 1:01:17 PM reviewed [facility] near her or [facility]- caller agrees Referrals GO TO [facility]

## 2019-09-27 DIAGNOSIS — R42 Dizziness and giddiness: Secondary | ICD-10-CM | POA: Diagnosis not present

## 2019-09-27 DIAGNOSIS — H8112 Benign paroxysmal vertigo, left ear: Secondary | ICD-10-CM | POA: Diagnosis not present

## 2019-09-28 NOTE — Telephone Encounter (Signed)
Spoke to pt. She went to ENT yesterday and had Epley's Maneuver and she is feeling better today.

## 2019-09-29 ENCOUNTER — Telehealth: Payer: Self-pay

## 2019-09-29 DIAGNOSIS — G620 Drug-induced polyneuropathy: Secondary | ICD-10-CM

## 2019-09-29 NOTE — Telephone Encounter (Signed)
Pt c/o worsening neuropathy in bilateral hands.  She is requesting a referral to a specialist for further evaluation.  Please advise.

## 2019-09-29 NOTE — Telephone Encounter (Signed)
Please ask her if this is the same neuropathy from the chemotherapy or something different. I am not sure whether she needs a neurologist or a hand specialist Last visit (months ago) she said it wasn't bad

## 2019-09-30 NOTE — Telephone Encounter (Signed)
Pt called back her Neuropathy is from chemotherpy

## 2019-10-01 NOTE — Addendum Note (Signed)
Addended by: Viviana Simpler I on: 10/01/2019 01:09 PM   Modules accepted: Orders

## 2019-11-08 ENCOUNTER — Telehealth: Payer: Self-pay

## 2019-11-08 NOTE — Telephone Encounter (Signed)
Nutrition  Patient called and left message for RD regarding concerns about recent weight loss with good appetite.   RD called patient back.  Patient reports that she has a good appetite but has cut back some on her snacks.  She was eating doritos and sweets but just got tired of them.    Weight 114 lb on 7/16 117 lb on 3/18 121 lb on 118 lb  RD discussed importance of increasing calories with recent weight loss. Discussed snacks that patient can add to increase calories to promote weight gain.  Encouraged keeping food diary and counting calories.  All questions and concerns addressed. Patient appreciative of call.   Patient to contact RD as needed  Jetty Berland B. [name], [place], [place] Registered Dietitian [phone] (mobile)

## 2019-11-18 ENCOUNTER — Emergency Department: Payer: Medicare PPO

## 2019-11-18 ENCOUNTER — Emergency Department
Admission: EM | Admit: 2019-11-18 | Discharge: 2019-11-19 | Disposition: A | Discharge status: Home / Self Care | Payer: Medicare PPO | Attending: Emergency Medicine | Admitting: Emergency Medicine

## 2019-11-18 ENCOUNTER — Other Ambulatory Visit: Payer: Self-pay

## 2019-11-18 ENCOUNTER — Telehealth: Payer: Self-pay

## 2019-11-18 DIAGNOSIS — E860 Dehydration: Secondary | ICD-10-CM

## 2019-11-18 DIAGNOSIS — Z20822 Contact with and (suspected) exposure to covid-19: Secondary | ICD-10-CM | POA: Insufficient documentation

## 2019-11-18 DIAGNOSIS — Z87891 Personal history of nicotine dependence: Secondary | ICD-10-CM | POA: Insufficient documentation

## 2019-11-18 DIAGNOSIS — R531 Weakness: Secondary | ICD-10-CM

## 2019-11-18 DIAGNOSIS — R42 Dizziness and giddiness: Secondary | ICD-10-CM | POA: Diagnosis present

## 2019-11-18 DIAGNOSIS — Z853 Personal history of malignant neoplasm of breast: Secondary | ICD-10-CM | POA: Diagnosis not present

## 2019-11-18 DIAGNOSIS — R110 Nausea: Secondary | ICD-10-CM | POA: Diagnosis not present

## 2019-11-18 DIAGNOSIS — N183 Chronic kidney disease, stage 3 unspecified: Secondary | ICD-10-CM | POA: Diagnosis not present

## 2019-11-18 DIAGNOSIS — Z85038 Personal history of other malignant neoplasm of large intestine: Secondary | ICD-10-CM | POA: Insufficient documentation

## 2019-11-18 LAB — URINALYSIS, COMPLETE (UACMP) WITH MICROSCOPIC
Bacteria, UA: NONE SEEN
Bilirubin Urine: NEGATIVE
Glucose, UA: NEGATIVE mg/dL
Hgb urine dipstick: NEGATIVE
Ketones, ur: NEGATIVE mg/dL
Nitrite: NEGATIVE
Protein, ur: NEGATIVE mg/dL
Specific Gravity, Urine: 1.005 (ref 1.005–1.030)
Squamous Epithelial / HPF: NONE SEEN (ref 0–5)
WBC, UA: NONE SEEN WBC/hpf (ref 0–5)
pH: 7 (ref 5.0–8.0)

## 2019-11-18 LAB — CBC
HCT: 40.1 % (ref 36.0–46.0)
Hemoglobin: 13.5 g/dL (ref 12.0–15.0)
MCH: 32.1 pg (ref 26.0–34.0)
MCHC: 33.7 g/dL (ref 30.0–36.0)
MCV: 95.2 fL (ref 80.0–100.0)
Platelets: 132 10*3/uL — ABNORMAL LOW (ref 150–400)
RBC: 4.21 MIL/uL (ref 3.87–5.11)
RDW: 12.3 % (ref 11.5–15.5)
WBC: 2.6 10*3/uL — ABNORMAL LOW (ref 4.0–10.5)
nRBC: 0 % (ref 0.0–0.2)

## 2019-11-18 LAB — HEPATIC FUNCTION PANEL
ALT: 11 U/L (ref 0–44)
AST: 19 U/L (ref 15–41)
Albumin: 3.8 g/dL (ref 3.5–5.0)
Alkaline Phosphatase: 63 U/L (ref 38–126)
Bilirubin, Direct: <0.1 mg/dL (ref 0.0–0.2)
Total Bilirubin: 0.7 mg/dL (ref 0.3–1.2)
Total Protein: 7.2 g/dL (ref 6.5–8.1)

## 2019-11-18 LAB — TROPONIN I (HIGH SENSITIVITY)
Troponin I (High Sensitivity): 4 ng/L (ref <18)
Troponin I (High Sensitivity): 4 ng/L (ref <18)

## 2019-11-18 LAB — BASIC METABOLIC PANEL
Anion gap: 9 (ref 5–15)
BUN: 10 mg/dL (ref 8–23)
CO2: 27 mmol/L (ref 22–32)
Calcium: 8.9 mg/dL (ref 8.9–10.3)
Chloride: 103 mmol/L (ref 98–111)
Creatinine, Ser: 0.8 mg/dL (ref 0.44–1.00)
GFR, Estimated: >60 mL/min (ref >60)
Glucose, Bld: 112 mg/dL — ABNORMAL HIGH (ref 70–99)
Potassium: 3.8 mmol/L (ref 3.5–5.1)
Sodium: 139 mmol/L (ref 135–145)

## 2019-11-18 LAB — RESPIRATORY PANEL BY RT PCR (FLU A&B, COVID)
Influenza A by PCR: NEGATIVE
Influenza B by PCR: NEGATIVE
SARS Coronavirus 2 by RT PCR: NEGATIVE

## 2019-11-18 LAB — LACTIC ACID, PLASMA: Lactic Acid, Venous: 1 mmol/L (ref 0.5–1.9)

## 2019-11-18 LAB — MAGNESIUM: Magnesium: 2.2 mg/dL (ref 1.7–2.4)

## 2019-11-18 MED ORDER — LACTATED RINGERS IV BOLUS
1000.0000 mL | Freq: Once | INTRAVENOUS | Status: AC
  Administered 2019-11-18: 1000 mL via INTRAVENOUS

## 2019-11-18 NOTE — Telephone Encounter (Signed)
Pt said that for 2 - 3 wk has had itchng; now has red blotches the size of pencil eraser on leg and hip and the size of dime on arm; no blister like areas. Today the itching has worsened. Also today no energy; pt has been laying in bed most of day and that is very unusual for pt. pt said she does not feel like herself and does not feel good. Pt does not have a way to ck BP. Pt said she feels lightheaded (the room does not spin around). Pt has new symptoms of weakness in lt arm; pt can hold a cup and no issues with weakness in lt leg. No available appts at [facility]. Pt said she knows where [facility] is but pt will either go to [facility] or [facility] ED; advised may want to start at [facility] because long wait at ED. Pt voiced understanding and will cb next wk with update.

## 2019-11-18 NOTE — ED Triage Notes (Signed)
Pt reports weakness starting this morning when she woke up and unexplainable fatigue.   States when she tries to move she feels "out of balance". Hx of vertigo, states this time it feels different. Some nausea. During the week she has also felt intermittent sharp chest pains. No chest pain now.

## 2019-11-18 NOTE — ED Provider Notes (Signed)
[facility] Emergency Department Provider Note  ____________________________________________   First MD Initiated Contact with Patient 11/18/19 1951     (approximate)  I have reviewed the triage vital signs and the nursing notes.   HISTORY  Chief Complaint Weakness   HPI Amy Bird is a 69 y.o. female with past medical history of anemia, arthritis, breast cancer status post chemo and lumpectomy as well as radiation therapy, diverticulitis, HDL, and recurrent vertigo who presents for assessment of lightheadedness and some nausea that began this morning.  Patient states her lightheadedness does not at all feel like her typical vertigo.  She denies any spinning sensation.  She states that she little nausea earlier today but no other acute symptoms other than "something not quite feeling quite right".  She denies any current headache, vision changes, chest pain, abdominal pain, cough, shortness of breath, back pain, rash, extremity pain, or other acute complaints.  No prior synopsis.  No clear alleviating aggravating factors.  Patient denies any EtOH use, illicit drug use, or tobacco abuse.  Denies any recent falls or injuries.  Denies any change in her usual p.o. intake.         Past Medical History:  Diagnosis Date  . Anemia   . Arthritis   . Breast cancer, left breast ([place]) 2002   Left Mastectomy, chemo + Rad tx's.  . Colon cancer ([place]) 10/2012   Partial colon resection and chemo tx's.  . Diverticulitis   . Glaucoma   . Hearing loss   . Hyperlipidemia 20 years  . Kidney stone   . Lymphedema of left arm   . Neuromuscular disorder ([place])    neuropathy  feet and hands from chemo tx  . Pneumonia   . Shingles   . Vertigo    last episode around Jan 2018    Patient Active Problem List   Diagnosis Date Noted  . Personal history of colon cancer   . Preventative health care 04/01/2018  . Aortic atherosclerosis ([place]) 04/01/2018  . Chronic renal  disease, stage III ([place]) 04/01/2018  . Advance directive discussed with patient 04/01/2018  . Elevated tumor markers 11/15/2017  . Thrombocytopenia ([place]) 08/27/2017  . Lymphedema of left upper extremity 02/23/2017  . B12 deficiency 02/10/2016  . History of left breast cancer 09/17/2015  . Leukopenia 09/17/2015  . Absolute anemia 11/08/2014  . Neutropenia ([place]) 11/08/2014  . Peripheral neuropathy due to chemotherapy ([place]) 08/10/2014  . Dyspepsia 08/10/2014  . Allergic rhinitis 01/29/2005    Past Surgical History:  Procedure Laterality Date  . ABDOMINAL HYSTERECTOMY  1973  . APPENDECTOMY  1969  . CATARACT EXTRACTION W/PHACO Left 07/11/2015   Procedure: CATARACT EXTRACTION PHACO AND INTRAOCULAR LENS PLACEMENT ([place]) left eye;  Surgeon: Leandrew Koyanagi, MD;  Location: [place];  Service: Ophthalmology;  Laterality: Left;  . CATARACT EXTRACTION W/PHACO Right 08/27/2016   Procedure: CATARACT EXTRACTION PHACO AND INTRAOCULAR LENS PLACEMENT (IOC);  Surgeon: Leandrew Koyanagi, MD;  Location: [place];  Service: Ophthalmology;  Laterality: Right;  IVA TOPICAL RIGHT  . CESAREAN SECTION    . COLON RESECTION    . COLONOSCOPY  2004  . COLONOSCOPY N/A 04/04/2016   Procedure: COLONOSCOPY;  Surgeon: Lollie Sails, MD;  Location[facility] END[facility]COPY;  Service: Endoscopy;  Laterality: N/A;  . COLONOSCOPY WITH PROPOFOL N/A 10/01/2018   Procedure: COLONOSCOPY WITH PROPOFOL;  Surgeon: [name], MD;  Location: [place];  Service: Endoscopy;  Laterality: N/A;  . ESOPHAGOGASTRODUODENOSCOPY N/A 04/04/2016  Procedure: ESOPHAGOGASTRODUODENOSCOPY (EGD);  Surgeon: Lollie Sails, MD;  Location: [facility] ENDOSCOPY;  Service: Endoscopy;  Laterality: N/A;  . kidney stone removal     . MASTECTOMY Bilateral 2002,2010  . salpingo oophorectmy   1973    Prior to Admission medications   Medication Sig Start Date End Date Taking? Authorizing Provider  Calcium Carb-Cholecalciferol  (CALCIUM 600 + D PO) Take 1 tablet by mouth daily.     [provider]  cholecalciferol (VITAMIN D3) 25 MCG (1000 UT) tablet Take 1,000 Units by mouth daily.    [provider]  latanoprost (XALATAN) 0.005 % ophthalmic solution Place 1 drop into both eyes at bedtime.    [provider]  vitamin C (ASCORBIC ACID) 500 MG tablet Take 500 mg by mouth daily.    [provider]    Allergies Hydrocodone-acetaminophen, Sulfa antibiotics, Cefuroxime, Hydrocodone-ibuprofen, Levofloxacin, Statins, Other, Tape, and Tree extract  Family History  Problem Relation Age of Onset  . Hypertension Mother   . Alzheimer's disease Mother   . Cancer Father        prostate  . Prostate cancer Father   . Cancer Sister        breast  . Hypertension Sister   . Cancer Brother        stomach  . Hypertension Brother   . Sarcoidosis Other        positive family history.  . Cancer Sister        breast  . Cancer Sister        breast    Social History Social History   Tobacco Use  . Smoking status: Former Smoker    Packs/day: 1.50    Years: 35.00    Pack years: 52.50    Types: Cigarettes    Quit date: 2002    Years since quitting: 19.8  . Smokeless tobacco: Never Used  Substance Use Topics  . Alcohol use: No  . Drug use: No    Review of Systems  Review of Systems  Constitutional: Negative for chills and fever.  HENT: Negative for sore throat.   Eyes: Negative for pain.  Respiratory: Negative for cough and stridor.   Cardiovascular: Negative for chest pain.  Gastrointestinal: Positive for nausea. Negative for vomiting.  Genitourinary: Negative for dysuria.  Musculoskeletal: Negative for myalgias.  Skin: Negative for rash.  Neurological: Negative for seizures, loss of consciousness and headaches.  Psychiatric/Behavioral: Negative for suicidal ideas.  All other systems reviewed and are negative.      ____________________________________________   PHYSICAL EXAM:  VITAL SIGNS: ED Triage Vitals [11/18/19 1750]  Enc Vitals Group     BP (!) 108/52     Pulse Rate 83     Resp 18     Temp 98.3 F (36.8 C)     Temp Source Oral     SpO2 100 %     Weight 115 lb (52.2 kg)     Height 5\' 2"  (1.575 m)     Head Circumference      Peak Flow      Pain Score 0     Pain Loc      Pain Edu?      Excl. in [place]?    Vitals:   11/18/19 1750 11/18/19 2300  BP: (!) 108/52 139/79  Pulse: 83 77  Resp: 18 18  Temp: 98.3 F (36.8 C)   SpO2: 100% 100%   Physical Exam Vitals and nursing note reviewed.  Constitutional:  General: She is not in acute distress.    Appearance: She is well-developed.  HENT:     Head: Normocephalic and atraumatic.     Right Ear: External ear normal.     Left Ear: External ear normal.     Nose: Nose normal.     Mouth/Throat:     Mouth: Mucous membranes are dry.  Eyes:     Conjunctiva/sclera: Conjunctivae normal.  Cardiovascular:     Rate and Rhythm: Normal rate and regular rhythm.     Heart sounds: No murmur heard.   Pulmonary:     Effort: Pulmonary effort is normal. No respiratory distress.     Breath sounds: Normal breath sounds.  Abdominal:     Palpations: Abdomen is soft.     Tenderness: There is no abdominal tenderness.  Musculoskeletal:     Cervical back: Neck supple.  Skin:    General: Skin is warm and dry.     Capillary Refill: Capillary refill takes 2 to 3 seconds.  Neurological:     Mental Status: She is alert and oriented to person, place, and time.  Psychiatric:        Mood and Affect: Mood normal.      ____________________________________________   LABS (all labs ordered are listed, but only abnormal results are displayed)  Labs Reviewed  BASIC METABOLIC PANEL - Abnormal; Notable for the following components:      Result Value   Glucose, Bld 112 (*)    All other components within normal limits  CBC - Abnormal; Notable for  the following components:   WBC 2.6 (*)    Platelets 132 (*)    All other components within normal limits  URINALYSIS, COMPLETE (UACMP) WITH MICROSCOPIC - Abnormal; Notable for the following components:   Color, Urine STRAW (*)    APPearance CLEAR (*)    Leukocytes,Ua TRACE (*)    All other components within normal limits  RESPIRATORY PANEL BY RT PCR (FLU A&B, COVID)  MAGNESIUM  LACTIC ACID, PLASMA  HEPATIC FUNCTION PANEL  TROPONIN I (HIGH SENSITIVITY)  TROPONIN I (HIGH SENSITIVITY)   ____________________________________________  EKG  Sinus rhythm with a ventricular rate of 80, normal axis, unremarkable intervals, T wave inversion in V2 with no other evidence of acute ischemia or other significant underlying arrhythmia. ____________________________________________  RADIOLOGY  ED MD interpretation: No evidence of focal consolidation, effusion, edema, wide mediastinum, or other acute intrathoracic process.  Official radiology report(s): DG Chest 2 View  Result Date: 11/18/2019 CLINICAL DATA:  Weakness and lightheadedness. EXAM: CHEST - 2 VIEW COMPARISON:  Chest x-ray 02/16/2014 FINDINGS: The cardiac silhouette, mediastinal and hilar contours are normal. The lungs are clear. No pleural effusions. No pneumothorax. No pulmonary lesions. Surgical changes from a left mastectomy.  The bony thorax is intact. IMPRESSION: No acute cardiopulmonary findings. Electronically Signed   By: Marijo Sanes M.D.   On: 11/18/2019 20:32    ____________________________________________   PROCEDURES  Procedure(s) performed (including Critical Care):  .1-3 Lead EKG Interpretation Performed by: Lucrezia Starch, MD Authorized by: Lucrezia Starch, MD     Interpretation: normal     ECG rate assessment: normal     Rhythm: sinus rhythm     Ectopy: none     Conduction: normal       ____________________________________________   INITIAL IMPRESSION / ASSESSMENT AND PLAN / ED COURSE         Patient presents above to history exam for evaluation of fatigue and lightheadedness that began  today which was associate with some nausea earlier in the day but this seems to have gotten better since arriving emergency room.  Patient is borderline hypotensive with a BP of 108/52 with otherwise stable vital signs on room air.  Exam as above remarkable for dry mucous membranes and slightly decreased capillary refill time is otherwise no obvious foci for infection or significant findings.  History exam is not consistent with acute traumatic injury.  Low suspicion for toxic ingestion.  Differential includes but is not limited to ACS, PE, pneumonia, UTI, metabolic derangements, anemia, arrhythmia, and heart failure.  Covid is also has differential.  Chest x-ray showed no evidence of pneumonia or volume overload.  EKG shows no evidence of arrhythmia.  Low suspicion for ACS given patient denies any chest pain has a reassuring EKG with a troponin of 4.  Very low suspicion for PE at this time as patient denies any chest pain, shortness of breath and is not tachycardic, tachypneic, hypoxic.  This was obtained greater than 3 hours after symptom onset.  CBC shows evidence of low white blood cell count of 2.6 but this appears to be at baseline that she has had multiple white blood cell counts over the last year in the 2.4-3.1 range.  BMP has no evidence of significant intrametabolic derangements.  Hepatic function panel is unremarkable.  No evidence of acute cholestasis.  Lactic acid is normal at 1.  UA is unremarkable.  Very low suspicion for sepsis CVA or other immediate life-threatening process at this time.  Patient's overall presentation is consistent with mild dehydration and she was given IV fluids.  On reassessment her blood pressure had improved and she states she felt much better.  Unclear etiology for patient's dehydration I advised patient to hydrate appropriately and follow-up with her PCP in the next 3 to 5  days.  Patient was discharged stable condition.  Strict return precautions advised and discussed.  ____________________________________________   FINAL CLINICAL IMPRESSION(S) / ED DIAGNOSES  Final diagnoses:  Weakness  Dehydration    Medications  lactated ringers bolus 1,000 mL (1,000 mLs Intravenous New Bag/Given 11/18/19 2300)     ED Discharge Orders    None       Note:  This document was prepared using Dragon voice recognition software and may include unintentional dictation errors.   Lucrezia Starch, MD 11/19/19 [phone]

## 2019-11-18 NOTE — Telephone Encounter (Signed)
[place] [facility] - Client TELEPHONE ADVICE RECORD [facility] Patient Name: Amy Bird Gender: Female DOB: 09/27/1950 Age: 69 Y 9 M 15 D Return Phone Number: [phone] (Primary) Address: City/State/Zip: [place] [address] Client [facility] - Client Client Site [place] - Day Physician Viviana Simpler- MD Contact Type Call Who Is Calling Patient / Member / Family / Caregiver Call Type Triage / Clinical Relationship To Patient Self Return Phone Number [phone] (Primary) Chief Complaint Dizziness Reason for Call Symptomatic / Request for [name] states that she has vertigo and blotches on her skin. Translation No Nurse Assessment Nurse: Joya Gaskins, RN, Vonna Kotyk Date/Time Eilene Ghazi Time): 11/18/2019 3:03:27 PM Confirm and document reason for call. If symptomatic, describe symptoms. ---Caller states that she is dizzy with movement, and she has noticed some rash on her body, as well as weight loss (6 lb in the last month). Does the patient have any new or worsening symptoms? ---Yes Will a triage be completed? ---Yes Related visit to physician within the last 2 weeks? ---No Does the PT have any chronic conditions? (i.e. diabetes, asthma, this includes High risk factors for pregnancy, etc.) ---No Is this a behavioral health or substance abuse call? ---No Guidelines Guideline Title Affirmed Question Affirmed Notes Nurse Date/Time (Eastern Time) Dizziness - Lightheadedness [1] MODERATE dizziness (e.g., interferes with normal activities) AND [2] has been evaluated by physician for this Joya Gaskins, RN, Vonna Kotyk 11/18/2019 3:05:40 PM Disp. Time Eilene Ghazi Time) Disposition Final User 11/18/2019 3:07:45 PM SEE PCP WITHIN 3 DAYS Yes Joya Gaskins, RN, Aviva Kluver Disagree/Comply Comply Caller Understands Yes PLEASE NOTE: All timestamps contained within this report are represented as  [place] Standard Time. CONFIDENTIALTY NOTICE: This fax transmission is intended only for the addressee. It contains information that is legally privileged, confidential or otherwise protected from use or disclosure. If you are not the intended recipient, you are strictly prohibited from reviewing, disclosing, copying using or disseminating any of this information or taking any action in reliance on or regarding this information. If you have received this fax in error, please notify us immediately by telephone so that we can arrange for its return to [place]. Phone: [phone], Toll-Free: [phone], Fax: [phone] Page: 2 of 2 Call Id: [id number] PreDisposition Call Doctor Care Advice Given Per Guideline SEE PCP WITHIN 3 DAYS: * You need to be seen within 2 or 3 days. * PCP VISIT: Call your doctor (or NP/PA) during regular office hours and make an appointment. A clinic or urgent care center are good places to go for care if your doctor's office is closed or you can't get an appointment. NOTE: If office will be open tomorrow, tell caller to call then, not in 3 days. DRINK FLUIDS: LIE DOWN AND REST: CALL BACK IF: * Passes out (faints) * You become worse CARE ADVICE given per Dizziness (Adult) guideline.

## 2019-11-20 NOTE — Telephone Encounter (Signed)
I don't see any visit Please check on her on Monday morning. Can add at 1:45 or 4:15 if still having symptoms

## 2019-11-21 NOTE — Telephone Encounter (Signed)
Spoke to pt. She said she went to [facility] and they sent her to the ER at [facility]. Was told she was dehydrated and they gave her some IV fluids. She said she is feeling better and does not think she needs an OV today. She said the blotches are better also.

## 2019-11-21 NOTE — Telephone Encounter (Signed)
Okay---good to hear she is feeling better.

## 2020-01-06 DIAGNOSIS — E519 Thiamine deficiency, unspecified: Secondary | ICD-10-CM | POA: Diagnosis not present

## 2020-01-06 DIAGNOSIS — H401131 Primary open-angle glaucoma, bilateral, mild stage: Secondary | ICD-10-CM | POA: Diagnosis not present

## 2020-01-06 DIAGNOSIS — M792 Neuralgia and neuritis, unspecified: Secondary | ICD-10-CM | POA: Diagnosis not present

## 2020-01-06 DIAGNOSIS — E559 Vitamin D deficiency, unspecified: Secondary | ICD-10-CM | POA: Diagnosis not present

## 2020-01-06 DIAGNOSIS — E531 Pyridoxine deficiency: Secondary | ICD-10-CM | POA: Diagnosis not present

## 2020-01-06 DIAGNOSIS — E538 Deficiency of other specified B group vitamins: Secondary | ICD-10-CM | POA: Diagnosis not present

## 2020-01-06 DIAGNOSIS — R7309 Other abnormal glucose: Secondary | ICD-10-CM | POA: Diagnosis not present

## 2020-02-01 ENCOUNTER — Telehealth: Payer: Self-pay | Admitting: *Deleted

## 2020-02-01 NOTE — Telephone Encounter (Signed)
Patient called stating that she called her neurologist yesterday because her finger was bruised and she thought that it was related to neuropathy .Patient stated the neurologist office called her back today and told her the bruising would not have anything to do with neuropathy and to call her PCP.   Patient stated yesterday she was putting on her mask and the middle finger on her right hand started hurting and she noticed that it was bruised.  Patient denies that she injured her finger putting her mask on. Patient stated that her finger is feeling better today and does not feel that she did this putting her mask on. Patient stated that she wants to know if Amy Bird thinks that this can be related to the neuropathy that she has because she does have numbness and tingling in her hands. Patient stated that she has upcoming test scheduled by the neurologist.

## 2020-02-02 NOTE — Telephone Encounter (Signed)
Spoke to pt

## 2020-02-02 NOTE — Telephone Encounter (Signed)
Let her know that I agree with the neurologist that the bruising would not be related to her neuropathy. It is likely she had some minor trauma to the finger that she just doesn't remember No reason to be checked unless there is increasing pain or it gets bigger

## 2020-02-13 DIAGNOSIS — M792 Neuralgia and neuritis, unspecified: Secondary | ICD-10-CM | POA: Diagnosis not present

## 2020-02-14 DIAGNOSIS — M792 Neuralgia and neuritis, unspecified: Secondary | ICD-10-CM | POA: Diagnosis not present

## 2020-03-06 DIAGNOSIS — H401131 Primary open-angle glaucoma, bilateral, mild stage: Secondary | ICD-10-CM | POA: Diagnosis not present

## 2020-04-12 ENCOUNTER — Encounter: Payer: Medicare PPO | Admitting: Internal Medicine

## 2020-04-19 DIAGNOSIS — H401111 Primary open-angle glaucoma, right eye, mild stage: Secondary | ICD-10-CM | POA: Diagnosis not present

## 2020-04-25 ENCOUNTER — Ambulatory Visit: Payer: Medicare PPO | Admitting: Internal Medicine

## 2020-04-25 ENCOUNTER — Telehealth: Payer: Self-pay

## 2020-04-25 DIAGNOSIS — Z20822 Contact with and (suspected) exposure to covid-19: Secondary | ICD-10-CM | POA: Diagnosis not present

## 2020-04-25 NOTE — Telephone Encounter (Signed)
This sounds worrisome for COVID Please check on her later or tomorrow

## 2020-04-25 NOTE — Telephone Encounter (Signed)
Pt calling and starting on 04/21/20 thought having seasonal allergies and then chills,achiness and diarrhea started. No fever or vomiting. Pt was offered a video visit this morning with DR Silvio Pate but pt wanted to be seen in person so pt said she would go to [facility]. FYI to Dr Silvio Pate.

## 2020-04-26 DIAGNOSIS — R197 Diarrhea, unspecified: Secondary | ICD-10-CM | POA: Diagnosis not present

## 2020-04-26 DIAGNOSIS — R194 Change in bowel habit: Secondary | ICD-10-CM | POA: Diagnosis not present

## 2020-04-26 NOTE — Telephone Encounter (Signed)
Left message on VM to call office to see how she is doing and if she was seen somewhere.

## 2020-04-27 DIAGNOSIS — K5900 Constipation, unspecified: Secondary | ICD-10-CM | POA: Diagnosis not present

## 2020-04-27 DIAGNOSIS — R194 Change in bowel habit: Secondary | ICD-10-CM | POA: Diagnosis not present

## 2020-04-27 DIAGNOSIS — R197 Diarrhea, unspecified: Secondary | ICD-10-CM | POA: Diagnosis not present

## 2020-04-27 DIAGNOSIS — Z85038 Personal history of other malignant neoplasm of large intestine: Secondary | ICD-10-CM | POA: Diagnosis not present

## 2020-04-30 NOTE — Telephone Encounter (Signed)
2nd call to pt to see how she is doing. Left message to call office.

## 2020-05-02 NOTE — Telephone Encounter (Signed)
Spoke to pt. She did go to [facility] and then [facility]. She said she is feeling better than she was but 100%.

## 2020-05-11 ENCOUNTER — Telehealth: Payer: Self-pay

## 2020-05-11 NOTE — Telephone Encounter (Signed)
Pt called wanting to let you know that she will transferring care to another provider and new office as she has not been able to be seen in our office due to her Sx diarrhea etc and she feels as though she may be able to have better care and be seen with a new office

## 2020-05-13 NOTE — Telephone Encounter (Signed)
Noted   Again, Amy Bird----can we try to lift the restrictions on seeing our patients. It is truly negatively affecting our care for them.

## 2020-05-14 NOTE — Telephone Encounter (Addendum)
Yes, we are actively working to get confirmation on decreased restrictions and updating our guidelines to expand our sick care services.  We should have final details within the week and to review at next provider meeting in order to move forward.    I am very sorry to hear that this patient has had a negative experience and is transferring her care out of the [facility] system.  I will try to reach out to her and offer her our apologies and explain that we are working to expand our capabilities and thank her for her feedback.

## 2020-05-17 DIAGNOSIS — R102 Pelvic and perineal pain: Secondary | ICD-10-CM | POA: Diagnosis not present

## 2020-05-17 DIAGNOSIS — N898 Other specified noninflammatory disorders of vagina: Secondary | ICD-10-CM | POA: Diagnosis not present

## 2020-05-25 DIAGNOSIS — R0989 Other specified symptoms and signs involving the circulatory and respiratory systems: Secondary | ICD-10-CM | POA: Diagnosis not present

## 2020-05-25 DIAGNOSIS — I789 Disease of capillaries, unspecified: Secondary | ICD-10-CM | POA: Diagnosis not present

## 2020-05-25 DIAGNOSIS — E236 Other disorders of pituitary gland: Secondary | ICD-10-CM | POA: Diagnosis not present

## 2020-05-25 DIAGNOSIS — G620 Drug-induced polyneuropathy: Secondary | ICD-10-CM | POA: Diagnosis not present

## 2020-05-25 DIAGNOSIS — R7303 Prediabetes: Secondary | ICD-10-CM | POA: Diagnosis not present

## 2020-05-25 DIAGNOSIS — T451X5A Adverse effect of antineoplastic and immunosuppressive drugs, initial encounter: Secondary | ICD-10-CM | POA: Diagnosis not present

## 2020-06-08 DIAGNOSIS — Z853 Personal history of malignant neoplasm of breast: Secondary | ICD-10-CM | POA: Diagnosis not present

## 2020-06-08 DIAGNOSIS — E782 Mixed hyperlipidemia: Secondary | ICD-10-CM | POA: Diagnosis not present

## 2020-06-08 DIAGNOSIS — Z85038 Personal history of other malignant neoplasm of large intestine: Secondary | ICD-10-CM | POA: Diagnosis not present

## 2020-06-08 DIAGNOSIS — Z0001 Encounter for general adult medical examination with abnormal findings: Secondary | ICD-10-CM | POA: Diagnosis not present

## 2020-06-08 DIAGNOSIS — D61818 Other pancytopenia: Secondary | ICD-10-CM | POA: Diagnosis not present

## 2020-06-08 DIAGNOSIS — Z0000 Encounter for general adult medical examination without abnormal findings: Secondary | ICD-10-CM | POA: Diagnosis not present

## 2020-06-08 DIAGNOSIS — G629 Polyneuropathy, unspecified: Secondary | ICD-10-CM | POA: Diagnosis not present

## 2020-06-19 DIAGNOSIS — R102 Pelvic and perineal pain: Secondary | ICD-10-CM | POA: Diagnosis not present

## 2020-06-21 DIAGNOSIS — A084 Viral intestinal infection, unspecified: Secondary | ICD-10-CM | POA: Diagnosis not present

## 2020-06-21 DIAGNOSIS — Z85038 Personal history of other malignant neoplasm of large intestine: Secondary | ICD-10-CM | POA: Diagnosis not present

## 2020-06-21 DIAGNOSIS — K5900 Constipation, unspecified: Secondary | ICD-10-CM | POA: Diagnosis not present

## 2020-07-05 ENCOUNTER — Other Ambulatory Visit (INDEPENDENT_AMBULATORY_CARE_PROVIDER_SITE_OTHER): Payer: Self-pay | Admitting: Vascular Surgery

## 2020-07-05 DIAGNOSIS — R0989 Other specified symptoms and signs involving the circulatory and respiratory systems: Secondary | ICD-10-CM

## 2020-07-06 ENCOUNTER — Ambulatory Visit (INDEPENDENT_AMBULATORY_CARE_PROVIDER_SITE_OTHER): Payer: Medicare PPO

## 2020-07-06 ENCOUNTER — Ambulatory Visit (INDEPENDENT_AMBULATORY_CARE_PROVIDER_SITE_OTHER): Payer: Medicare PPO | Admitting: Vascular Surgery

## 2020-07-06 ENCOUNTER — Other Ambulatory Visit: Payer: Self-pay

## 2020-07-06 VITALS — BP 108/68 | HR 83 | Resp 16 | Ht 62.0 in | Wt 106.0 lb

## 2020-07-06 DIAGNOSIS — R0989 Other specified symptoms and signs involving the circulatory and respiratory systems: Secondary | ICD-10-CM

## 2020-07-06 DIAGNOSIS — M79609 Pain in unspecified limb: Secondary | ICD-10-CM | POA: Insufficient documentation

## 2020-07-06 DIAGNOSIS — R102 Pelvic and perineal pain: Secondary | ICD-10-CM | POA: Insufficient documentation

## 2020-07-06 DIAGNOSIS — I890 Lymphedema, not elsewhere classified: Secondary | ICD-10-CM | POA: Diagnosis not present

## 2020-07-06 DIAGNOSIS — T451X5A Adverse effect of antineoplastic and immunosuppressive drugs, initial encounter: Secondary | ICD-10-CM

## 2020-07-06 DIAGNOSIS — M79604 Pain in right leg: Secondary | ICD-10-CM

## 2020-07-06 DIAGNOSIS — M79605 Pain in left leg: Secondary | ICD-10-CM | POA: Diagnosis not present

## 2020-07-06 DIAGNOSIS — G620 Drug-induced polyneuropathy: Secondary | ICD-10-CM | POA: Diagnosis not present

## 2020-07-06 NOTE — Assessment & Plan Note (Signed)
Wearing her sleeve and should continue this.  May need compression socks for her legs as well.

## 2020-07-06 NOTE — Progress Notes (Signed)
Patient ID: Amy Bird, female   DOB: [birth year], 70 y.o.   MRN: [id number]  Chief Complaint  Patient presents with   New Patient (Initial Visit)    Ref [place] consult delayed capillary refill,decreased left radial pulse,bil pedal/tibal pulses    HPI Amy Bird is a 70 y.o. female.  I am asked to see the patient by Kerry Dory, PA-C for evaluation of poorly palpable pedal and left arm pulses.  The patient has some lymphedema and arm swelling on the left from breast cancer and has had to treatments with chemotherapy which have been very hard on her.  She says they have burned up most of the veins in her arms.  She noticed that 1 vein on the left arm has gotten a lot bigger.  Her primary care provider had noticed in addition to some swelling not being able to well fill the pulses and noticing some diminished capillary refill in the feet.  Given these findings she was referred for evaluation of her perfusion.  Triphasic waveforms were seen in both radial arteries bilaterally today consistent with good blood flow.  Her ABIs were normal at 1.02 on the right and 1.04 on the left with triphasic waveforms and normal digital pressures consistent with no arterial insufficiency of the upper or lower extremities.     Past Medical History:  Diagnosis Date   Anemia    Arthritis    Breast cancer, left breast ([place]) 2002   Left Mastectomy, chemo + Rad tx's.   Colon cancer ([place]) 10/2012   Partial colon resection and chemo tx's.   Diverticulitis    Glaucoma    Hearing loss    Hyperlipidemia 20 years   Kidney stone    Lymphedema of left arm    Neuromuscular disorder (HCC)    neuropathy  feet and hands from chemo tx   Pneumonia    Shingles    Vertigo    last episode around Jan 2018    Past Surgical History:  Procedure Laterality Date   [place]   CATARACT EXTRACTION W/PHACO Left 07/11/2015   Procedure: CATARACT EXTRACTION PHACO AND INTRAOCULAR LENS  PLACEMENT ([place]) left eye;  Surgeon: Leandrew Koyanagi, MD;  Location: [place];  Service: Ophthalmology;  Laterality: Left;   CATARACT EXTRACTION W/PHACO Right 08/27/2016   Procedure: CATARACT EXTRACTION PHACO AND INTRAOCULAR LENS PLACEMENT (IOC);  Surgeon: Leandrew Koyanagi, MD;  Location: [place];  Service: Ophthalmology;  Laterality: Right;  IVA TOPICAL RIGHT   CESAREAN SECTION     COLON RESECTION     COLONOSCOPY  2004   COLONOSCOPY N/A 04/04/2016   Procedure: COLONOSCOPY;  Surgeon: Lollie Sails, MD;  Location: [facility] ENDOSCOPY;  Service: Endoscopy;  Laterality: N/A;   COLONOSCOPY WITH PROPOFOL N/A 10/01/2018   Procedure: COLONOSCOPY WITH PROPOFOL;  Surgeon: [name], MD;  Location: [place];  Service: Endoscopy;  Laterality: N/A;   ESOPHAGOGASTRODUODENOSCOPY N/A 04/04/2016   Procedure: ESOPHAGOGASTRODUODENOSCOPY (EGD);  Surgeon: Lollie Sails, MD;  Location[facility] ENDOSCOPY;  Service: Endoscopy;  Laterality: N/A;   kidney stone removal      MASTECTOMY Bilateral 2002,2010   salpingo oophorectmy   1973     Family History  Problem Relation Age of Onset   Hypertension Mother    Alzheimer's disease Mother    Cancer Father        prostate   Prostate cancer Father    Cancer Sister  breast   Hypertension Sister    Cancer Brother        stomach   Hypertension Brother    Sarcoidosis Other        positive family history.   Cancer Sister        breast   Cancer Sister        breast      Social History   Tobacco Use   Smoking status: Former    Packs/day: 1.50    Years: 35.00    Pack years: 52.50    Types: Cigarettes    Quit date: 2002    Years since quitting: 20.4   Smokeless tobacco: Never  Substance Use Topics   Alcohol use: No   Drug use: No     Allergies  Allergen Reactions   Hydrocodone-Acetaminophen Other (See Comments) and Shortness Of Breath    Not witnessed info gathered as part of patient screening.  PT  states her chest felt very heavy and difficulty breathing.   Sulfa Antibiotics Swelling   Cefuroxime Diarrhea   Hydrocodone-Ibuprofen Other (See Comments)    Patient feels like there is a heavy wight on her chest   Levofloxacin Hives   Statins Other (See Comments)    Leg cramps and groin pain   Other Itching    Tree Nuts   Tape Rash   Tree Extract Itching    Current Outpatient Medications  Medication Sig Dispense Refill   Calcium Carb-Cholecalciferol (CALCIUM 600 + D PO) Take 1 tablet by mouth daily.      cholecalciferol (VITAMIN D3) 25 MCG (1000 UT) tablet Take 1,000 Units by mouth daily.     latanoprost (XALATAN) 0.005 % ophthalmic solution Place 1 drop into both eyes at bedtime.     timolol (TIMOPTIC) 0.5 % ophthalmic solution Apply to eye.     vitamin C (ASCORBIC ACID) 500 MG tablet Take 500 mg by mouth daily.     No current facility-administered medications for this visit.      REVIEW OF SYSTEMS (Negative unless checked)  Constitutional: [] Weight loss  [] Fever  [] Chills Cardiac: [] Chest pain   [] Chest pressure   [] Palpitations   [] Shortness of breath when laying flat   [] Shortness of breath at rest   [] Shortness of breath with exertion. Vascular:  [] Pain in legs with walking   [] Pain in legs at rest   [] Pain in legs when laying flat   [] Claudication   [] Pain in feet when walking  [] Pain in feet at rest  [] Pain in feet when laying flat   [] History of DVT   [] Phlebitis   [x] Swelling in legs   [] Varicose veins   [] Non-healing ulcers Pulmonary:   [] Uses home oxygen   [] Productive cough   [] Hemoptysis   [] Wheeze  [] COPD   [] Asthma Neurologic:  [x] Dizziness  [] Blackouts   [] Seizures   [] History of stroke   [] History of TIA  [] Aphasia   [] Temporary blindness   [] Dysphagia   [x] Weakness or numbness in arms   [x] Weakness or numbness in legs Musculoskeletal:  [x] Arthritis   [] Joint swelling   [] Joint pain   [] Low back pain Hematologic:  [] Easy bruising  [] Easy bleeding    [] Hypercoagulable state   [x] Anemic  [] Hepatitis Gastrointestinal:  [] Blood in stool   [] Vomiting blood  [x] Gastroesophageal reflux/heartburn   [] Abdominal pain Genitourinary:  [] Chronic kidney disease   [] Difficult urination  [] Frequent urination  [] Burning with urination   [] Hematuria Skin:  [] Rashes   [] Ulcers   [] Wounds Psychological:  [] History  of anxiety   []  History of major depression.    Physical Exam BP 108/68 (BP Location: Right Arm)   Pulse 83   Resp 16   Ht 5\' 2"  (1.575 m)   Wt 106 lb (48.1 kg)   BMI 19.39 kg/m  Gen:  WD/WN, NAD Head: [place]/AT, No temporalis wasting.  Ear/Nose/Throat: Hearing grossly intact, nares w/o erythema or drainage, oropharynx w/o Erythema/Exudate Eyes: Conjunctiva clear, sclera non-icteric  Neck: trachea midline.  No JVD.  Pulmonary:  Good air movement, respirations not labored, no use of accessory muscles  Cardiac: RRR, no JVD Vascular:  Vessel Right Left  Radial Palpable 1+ Palpable                          DP 1+ 1+  PT 1+ 1+   Gastrointestinal:. No masses, surgical incisions, or scars. Musculoskeletal: M/S 5/5 throughout.  No deformity or atrophy. Mild LUE and BLE edema. Neurologic: Sensation grossly intact in extremities.  Symmetrical.  Speech is fluent. Motor exam as listed above. Psychiatric: Judgment intact, Mood & affect appropriate for pt's clinical situation. Dermatologic: No rashes or ulcers noted.  No cellulitis or open wounds.    Radiology No results found.  Labs No results found for this or any previous visit (from the past 2160 hour(s)).  Assessment/Plan:  Pain in limb Triphasic waveforms were seen in both radial arteries bilaterally today consistent with good blood flow.  Her ABIs were normal at 1.02 on the right and 1.04 on the left with triphasic waveforms and normal digital pressures consistent with no arterial insufficiency of the upper or lower extremities.   I think her symptoms are largely from peripheral  neuropathy from chemotherapy as well as some lymphedema making the pulses more difficult to palpate.  No vascular disease requiring treatment at this time.  We will see her back as needed.  Lymphedema of left upper extremity Wearing her sleeve and should continue this.  May need compression socks for her legs as well.  Peripheral neuropathy due to chemotherapy [facility]) I think this is the primary cause of her symptoms at this time.      Leotis Pain 07/06/2020, 12:10 PM   This note was created with Dragon medical transcription system.  Any errors from dictation are unintentional.

## 2020-07-06 NOTE — Assessment & Plan Note (Signed)
Triphasic waveforms were seen in both radial arteries bilaterally today consistent with good blood flow.  Her ABIs were normal at 1.02 on the right and 1.04 on the left with triphasic waveforms and normal digital pressures consistent with no arterial insufficiency of the upper or lower extremities.   I think her symptoms are largely from peripheral neuropathy from chemotherapy as well as some lymphedema making the pulses more difficult to palpate.  No vascular disease requiring treatment at this time.  We will see her back as needed.

## 2020-07-06 NOTE — Assessment & Plan Note (Signed)
I think this is the primary cause of her symptoms at this time.

## 2020-07-26 DIAGNOSIS — Z85038 Personal history of other malignant neoplasm of large intestine: Secondary | ICD-10-CM | POA: Diagnosis not present

## 2020-07-26 DIAGNOSIS — E236 Other disorders of pituitary gland: Secondary | ICD-10-CM | POA: Diagnosis not present

## 2020-07-26 DIAGNOSIS — D352 Benign neoplasm of pituitary gland: Secondary | ICD-10-CM | POA: Diagnosis not present

## 2020-07-26 DIAGNOSIS — Z853 Personal history of malignant neoplasm of breast: Secondary | ICD-10-CM | POA: Diagnosis not present

## 2020-07-31 ENCOUNTER — Telehealth: Payer: Self-pay | Admitting: Oncology

## 2020-07-31 NOTE — Telephone Encounter (Signed)
07/31/2020 Left VM informing pt that appts on 7/13 & 7/15 have been moved to 7/27 @ 3:45 (labs) and 7/29 @ 12:30 [facility]) due to changes in the providers schedule. Left call back number in case there are any questions, concerns or scheduling issues  SRW

## 2020-08-01 ENCOUNTER — Inpatient Hospital Stay: Payer: Medicare PPO

## 2020-08-01 ENCOUNTER — Inpatient Hospital Stay: Payer: Medicare PPO | Attending: Oncology

## 2020-08-01 DIAGNOSIS — Z800 Family history of malignant neoplasm of digestive organs: Secondary | ICD-10-CM | POA: Diagnosis not present

## 2020-08-01 DIAGNOSIS — Z881 Allergy status to other antibiotic agents status: Secondary | ICD-10-CM | POA: Insufficient documentation

## 2020-08-01 DIAGNOSIS — Z9049 Acquired absence of other specified parts of digestive tract: Secondary | ICD-10-CM | POA: Diagnosis not present

## 2020-08-01 DIAGNOSIS — Z87891 Personal history of nicotine dependence: Secondary | ICD-10-CM | POA: Insufficient documentation

## 2020-08-01 DIAGNOSIS — Z885 Allergy status to narcotic agent status: Secondary | ICD-10-CM | POA: Insufficient documentation

## 2020-08-01 DIAGNOSIS — Z818 Family history of other mental and behavioral disorders: Secondary | ICD-10-CM | POA: Insufficient documentation

## 2020-08-01 DIAGNOSIS — Z882 Allergy status to sulfonamides status: Secondary | ICD-10-CM | POA: Insufficient documentation

## 2020-08-01 DIAGNOSIS — Z85038 Personal history of other malignant neoplasm of large intestine: Secondary | ICD-10-CM | POA: Insufficient documentation

## 2020-08-01 DIAGNOSIS — Z8042 Family history of malignant neoplasm of prostate: Secondary | ICD-10-CM | POA: Diagnosis not present

## 2020-08-01 DIAGNOSIS — Z803 Family history of malignant neoplasm of breast: Secondary | ICD-10-CM | POA: Insufficient documentation

## 2020-08-01 DIAGNOSIS — Z888 Allergy status to other drugs, medicaments and biological substances status: Secondary | ICD-10-CM | POA: Diagnosis not present

## 2020-08-01 DIAGNOSIS — I890 Lymphedema, not elsewhere classified: Secondary | ICD-10-CM | POA: Insufficient documentation

## 2020-08-01 DIAGNOSIS — Z853 Personal history of malignant neoplasm of breast: Secondary | ICD-10-CM | POA: Insufficient documentation

## 2020-08-01 DIAGNOSIS — Z9013 Acquired absence of bilateral breasts and nipples: Secondary | ICD-10-CM | POA: Diagnosis not present

## 2020-08-01 DIAGNOSIS — Z79899 Other long term (current) drug therapy: Secondary | ICD-10-CM | POA: Insufficient documentation

## 2020-08-01 DIAGNOSIS — C182 Malignant neoplasm of ascending colon: Secondary | ICD-10-CM

## 2020-08-01 DIAGNOSIS — Z8249 Family history of ischemic heart disease and other diseases of the circulatory system: Secondary | ICD-10-CM | POA: Diagnosis not present

## 2020-08-01 DIAGNOSIS — Z886 Allergy status to analgesic agent status: Secondary | ICD-10-CM | POA: Insufficient documentation

## 2020-08-01 LAB — COMPREHENSIVE METABOLIC PANEL
ALT: 15 U/L (ref 0–44)
AST: 24 U/L (ref 15–41)
Albumin: 3.9 g/dL (ref 3.5–5.0)
Alkaline Phosphatase: 62 U/L (ref 38–126)
Anion gap: 8 (ref 5–15)
BUN: 16 mg/dL (ref 8–23)
CO2: 29 mmol/L (ref 22–32)
Calcium: 8.9 mg/dL (ref 8.9–10.3)
Chloride: 99 mmol/L (ref 98–111)
Creatinine, Ser: 0.9 mg/dL (ref 0.44–1.00)
GFR, Estimated: >60 mL/min (ref >60)
Glucose, Bld: 100 mg/dL — ABNORMAL HIGH (ref 70–99)
Potassium: 3.4 mmol/L — ABNORMAL LOW (ref 3.5–5.1)
Sodium: 136 mmol/L (ref 135–145)
Total Bilirubin: 0.5 mg/dL (ref 0.3–1.2)
Total Protein: 7.6 g/dL (ref 6.5–8.1)

## 2020-08-01 LAB — CBC WITH DIFFERENTIAL/PLATELET
Abs Immature Granulocytes: 0.01 10*3/uL (ref 0.00–0.07)
Basophils Absolute: 0 10*3/uL (ref 0.0–0.1)
Basophils Relative: 0 %
Eosinophils Absolute: 0 10*3/uL (ref 0.0–0.5)
Eosinophils Relative: 0 %
HCT: 39.4 % (ref 36.0–46.0)
Hemoglobin: 13.2 g/dL (ref 12.0–15.0)
Immature Granulocytes: 0 %
Lymphocytes Relative: 38 %
Lymphs Abs: 1.1 10*3/uL (ref 0.7–4.0)
MCH: 32.5 pg (ref 26.0–34.0)
MCHC: 33.5 g/dL (ref 30.0–36.0)
MCV: 97 fL (ref 80.0–100.0)
Monocytes Absolute: 0.2 10*3/uL (ref 0.1–1.0)
Monocytes Relative: 6 %
Neutro Abs: 1.6 10*3/uL — ABNORMAL LOW (ref 1.7–7.7)
Neutrophils Relative %: 56 %
Platelets: 148 10*3/uL — ABNORMAL LOW (ref 150–400)
RBC: 4.06 MIL/uL (ref 3.87–5.11)
RDW: 12.2 % (ref 11.5–15.5)
WBC: 2.9 10*3/uL — ABNORMAL LOW (ref 4.0–10.5)
nRBC: 0 % (ref 0.0–0.2)

## 2020-08-01 LAB — VITAMIN B12: Vitamin B-12: 400 pg/mL (ref 180–914)

## 2020-08-02 LAB — CANCER ANTIGEN 27.29: CA 27.29: 36.5 U/mL (ref 0.0–38.6)

## 2020-08-02 LAB — CEA: CEA: 7.3 ng/mL — ABNORMAL HIGH (ref 0.0–4.7)

## 2020-08-03 ENCOUNTER — Inpatient Hospital Stay: Payer: Medicare PPO | Admitting: Oncology

## 2020-08-06 DIAGNOSIS — U071 COVID-19: Secondary | ICD-10-CM | POA: Diagnosis not present

## 2020-08-15 ENCOUNTER — Other Ambulatory Visit: Payer: Medicare PPO

## 2020-08-17 ENCOUNTER — Other Ambulatory Visit: Payer: Self-pay

## 2020-08-17 ENCOUNTER — Encounter: Payer: Self-pay | Admitting: Oncology

## 2020-08-17 ENCOUNTER — Inpatient Hospital Stay: Payer: Medicare PPO | Admitting: Oncology

## 2020-08-17 VITALS — BP 117/67 | HR 82 | Temp 98.5°F | Resp 18 | Wt 110.7 lb

## 2020-08-17 DIAGNOSIS — Z853 Personal history of malignant neoplasm of breast: Secondary | ICD-10-CM

## 2020-08-17 DIAGNOSIS — H401111 Primary open-angle glaucoma, right eye, mild stage: Secondary | ICD-10-CM | POA: Diagnosis not present

## 2020-08-17 DIAGNOSIS — Z885 Allergy status to narcotic agent status: Secondary | ICD-10-CM | POA: Diagnosis not present

## 2020-08-17 DIAGNOSIS — C182 Malignant neoplasm of ascending colon: Secondary | ICD-10-CM

## 2020-08-17 DIAGNOSIS — E538 Deficiency of other specified B group vitamins: Secondary | ICD-10-CM | POA: Diagnosis not present

## 2020-08-17 DIAGNOSIS — Z87891 Personal history of nicotine dependence: Secondary | ICD-10-CM | POA: Diagnosis not present

## 2020-08-17 DIAGNOSIS — D696 Thrombocytopenia, unspecified: Secondary | ICD-10-CM | POA: Diagnosis not present

## 2020-08-17 DIAGNOSIS — Z882 Allergy status to sulfonamides status: Secondary | ICD-10-CM | POA: Diagnosis not present

## 2020-08-17 DIAGNOSIS — I890 Lymphedema, not elsewhere classified: Secondary | ICD-10-CM | POA: Diagnosis not present

## 2020-08-17 DIAGNOSIS — Z85038 Personal history of other malignant neoplasm of large intestine: Secondary | ICD-10-CM | POA: Diagnosis not present

## 2020-08-17 DIAGNOSIS — D72819 Decreased white blood cell count, unspecified: Secondary | ICD-10-CM | POA: Diagnosis not present

## 2020-08-17 DIAGNOSIS — Z881 Allergy status to other antibiotic agents status: Secondary | ICD-10-CM | POA: Diagnosis not present

## 2020-08-17 DIAGNOSIS — Z886 Allergy status to analgesic agent status: Secondary | ICD-10-CM | POA: Diagnosis not present

## 2020-08-17 DIAGNOSIS — Z79899 Other long term (current) drug therapy: Secondary | ICD-10-CM | POA: Diagnosis not present

## 2020-08-17 NOTE — Progress Notes (Signed)
Patient here for follow up. Pt reports small bump to mid chest area that has been there for approx 1 months. Pt denies/ discomfort to area.

## 2020-08-19 NOTE — Progress Notes (Signed)
[facility] day:  08/19/2020  Chief Complaint: Follow-up for breast cancer and colon cancer, pancytopenia   HPI  LASHONDA COMTOIS is a 70 y.o. female with a history of left breast cancer (2002), right sided colon cancer (2014), mild pancytopenia who is seen for 6 month assessment. Patient previously followed up with Dr. Mike Gip and switched care to me on 01/28/2019. Extensive previous medical records reviewed was performed by me  #Breast cancer Left breast cancer 2002, per note, underwent left mastectomy in March 2002. Pathology was not available. She received Adriamycin and Cytoxan [completed 07/2000/], followed by Herceptin and Taxotere in 08/2000 Patient did not receive any adjuvant hormone therapy.  She underwent a right-sided mastectomy in 02/2008  CA 27-29 has been followed was 40.9 on 06/06/2010, 42.6 on 02/04/2011, 35.3 on 03/31/2012, 38.1 on 11/09/2012, 52.3 on 09/17/2015, 50.5 on 10/01/2015, 47.8 on 02/11/2016, 36.1 on 06/20/2016, 36.6 on 10/20/2016, 42.6 on 02/23/2017, 44.6 on 03/26/2017, 37.7 on 05/22/2017, 47.1 on 08/25/2017, 45.7 on 12/28/2017, and 52.2 on 06/28/2018.  #Colon cancer, Diagnosed with colon cancer in 12/2012, underwent right hemicolectomy on 12/24/2012.  Pathology reviewed grade 2 Adenocarcinoma which extended through the muscularis propria and into the adjacent adipose.1/27 lymph nodes were positive.  Pathological stage [id number] Patient was treated with CALGB protocol with FOLFOX chemotherapy +/- celecoxib.  She withdrew from the study in 02/25/2013.  She described trouble with counts.  She received 6 cycles of FOLFOX (01/28/2013 - 04/29/2013).   Colonoscopy on 04/04/2016 revealed a 2 mm polyp in the distal sigmoid colon.  Pathology revealed a serrated polyp.  EGD on 04/04/2016 revealed LA grade A esophagitis, non-bleeding erosive gastropathy, gastritis, and duodenitis.  Gastric and GEJ pathology revealed moderate chronic  active inflammation, H pylori associated.   CEA has been followed was 3.8 on 12/09/2012, 6.3 on 01/27/2013, 10.6 on 05/26/2013, 5.9 on 09/22/2013, 7.1 on 05/10/2014, 7.0 on 11/08/2014, 5.8 on 05/08/2015, 7.2 on 09/17/2015, 5.9 on 10/01/2015, 6.5 on 02/11/2016, 6.9 on 10/20/2016, 7.6 on 02/23/2017, 7.3 on 08/25/2017, 6.7 on 12/28/2017, and 7.7 on 06/28/2018.   PET scan on 10/09/2015 revealed no evidence of recurrent disease.  Chest, abdomen, and pelvic CT scan on 02/15/2016 revealed no findings of recurrent malignancy.  There were radiation therapy related opacities in the left lung.  There was prominent stool throughout the colon.  There was lumbar degenerative disc disease.  PET scan on 09/11/2017 revealed no hypermetabolic residual or recurrent disease.  #Chronic history of pancytopenia since 10/2012. Bone marrow aspiration and biopsy in 10/2012 was negative for myelodysplastic syndrome.  #Weight loss, since discontinuation of antidepressants years ago.  Thyroid function has been checked which was normal. Labs all 02/26/2016 reviewed normal cortisol, sed rate, ANA.  HIV negative.    Left lower extremity duplex on 09/19/2015 revealed no evidence of DVT.  She had a left popliteal fossa Baker's cyst.  She has been diagnosed with H pylori (stool antigen + 05/17/2016) and treated x 3.   She has B12 deficiency.  B12 was 251 on 08/27/2017 and 760 on 09/24/2017.  Folate was 12.  She began oral B12 on 08/28/2017.  She has been off B12 x 1 month.  #Patient reports having bilateral lower extremity neuropathy.  She is not currently on any thing for neuropathy. Also she has not followed up with lymphedema clinic for a while.   INTERVAL HISTORY ALIVYAH HARDNETT is a 70 y.o. female who has above history reviewed by me today presents for  follow up visit for history of breast cancer and colon cancer. Problems and complaints are listed below: Patient has no new complaints today.   Denies any blood in her stool,  unintentional weight loss, night sweating, fever or chills. Clinical she is doing well.  She noticed a very small 'bump" at her right mastectomy site. Non tender.   Past Medical History:  Diagnosis Date   Anemia    Arthritis    Breast cancer, left breast ([place]) 2002   Left Mastectomy, chemo + Rad tx's.   Colon cancer ([place]) 10/2012   Partial colon resection and chemo tx's.   Diverticulitis    Glaucoma    Hearing loss    Hyperlipidemia 20 years   Kidney stone    Lymphedema of left arm    Neuromuscular disorder (HCC)    neuropathy  feet and hands from chemo tx   Pneumonia    Shingles    Vertigo    last episode around Jan 2018    Past Surgical History:  Procedure Laterality Date   [place]   CATARACT EXTRACTION W/PHACO Left 07/11/2015   Procedure: CATARACT EXTRACTION PHACO AND INTRAOCULAR LENS PLACEMENT ([place]) left eye;  Surgeon: Leandrew Koyanagi, MD;  Location: [place];  Service: Ophthalmology;  Laterality: Left;   CATARACT EXTRACTION W/PHACO Right 08/27/2016   Procedure: CATARACT EXTRACTION PHACO AND INTRAOCULAR LENS PLACEMENT (IOC);  Surgeon: Leandrew Koyanagi, MD;  Location: [facility];  Service: Ophthalmology;  Laterality: Right;  IVA TOPICAL RIGHT   CESAREAN SECTION     COLON RESECTION     COLONOSCOPY  2004   COLONOSCOPY N/A 04/04/2016   Procedure: COLONOSCOPY;  Surgeon: Lollie Sails, MD;  Location: [facility] ENDOSCOPY;  Service: Endoscopy;  Laterality: N/A;   COLONOSCOPY WITH PROPOFOL N/A 10/01/2018   Procedure: COLONOSCOPY WITH PROPOFOL;  Surgeon: [name], MD;  Location: [place];  Service: Endoscopy;  Laterality: N/A;   ESOPHAGOGASTRODUODENOSCOPY N/A 04/04/2016   Procedure: ESOPHAGOGASTRODUODENOSCOPY (EGD);  Surgeon: Lollie Sails, MD;  Location: [facility] ENDOSCOPY;  Service: Endoscopy;  Laterality: N/A;   kidney stone removal      MASTECTOMY Bilateral 2002,2010   salpingo oophorectmy   1973     Family History  Problem Relation Age of Onset   Hypertension Mother    Alzheimer's disease Mother    Cancer Father        prostate   Prostate cancer Father    Cancer Sister        breast   Hypertension Sister    Cancer Brother        stomach   Hypertension Brother    Sarcoidosis Other        positive family history.   Cancer Sister        breast   Cancer Sister        breast    Social History:  reports that she quit smoking about 20 years ago. Her smoking use included cigarettes. She has a 52.50 pack-year smoking history. She has never used smokeless tobacco. She reports that she does not drink alcohol and does not use drugs.  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  She quit smoking in 2003.  She lives in [place].  She belongs to the [employer].  The patient is alone today.  Allergies:  Allergies  Allergen Reactions   Hydrocodone-Acetaminophen Other (See Comments) and Shortness Of Breath    Not witnessed info gathered as part  of patient screening.  PT states her chest felt very heavy and difficulty breathing.   Sulfa Antibiotics Swelling   Cefuroxime Diarrhea   Hydrocodone-Ibuprofen Other (See Comments)    Patient feels like there is a heavy wight on her chest   Levofloxacin Hives   Statins Other (See Comments)    Leg cramps and groin pain   Other Itching    Tree Nuts   Tape Rash   Tree Extract Itching    Current Medications: Current Outpatient Medications  Medication Sig Dispense Refill   Calcium Carb-Cholecalciferol (CALCIUM 600 + D PO) Take 1 tablet by mouth daily.      cholecalciferol (VITAMIN D3) 25 MCG (1000 UT) tablet Take 1,000 Units by mouth daily.     latanoprost (XALATAN) 0.005 % ophthalmic solution Place 1 drop into both eyes at bedtime.     timolol (TIMOPTIC) 0.5 % ophthalmic solution Apply to eye.     vitamin C (ASCORBIC ACID) 500 MG tablet Take 500 mg by mouth daily.     No current facility-administered medications for this visit.     Review of Systems  Constitutional:  Negative for appetite change, chills, fatigue and fever.  HENT:   Negative for hearing loss and voice change.   Eyes:  Negative for eye problems.  Respiratory:  Negative for chest tightness and cough.   Cardiovascular:  Negative for chest pain.  Gastrointestinal:  Negative for abdominal distention, abdominal pain and blood in stool.  Endocrine: Negative for hot flashes.  Genitourinary:  Negative for difficulty urinating and frequency.   Musculoskeletal:  Negative for arthralgias.       She has some left upper extremity edema, uses lymphedema sleeve.  Skin:  Negative for itching and rash.  Neurological:  Positive for numbness. Negative for extremity weakness.  Hematological:  Negative for adenopathy.  Psychiatric/Behavioral:  Negative for confusion.      Physical Exam:  Blood pressure 117/67, pulse 82, temperature 98.5 F (36.9 C), resp. rate 18, weight 110 lb 11.2 oz (50.2 kg). Physical Exam Constitutional:      General: She is not in acute distress.    Comments: Thin built, she walks independently  HENT:     Head: Normocephalic and atraumatic.     Nose: Nose normal.     Mouth/Throat:     Pharynx: No oropharyngeal exudate.  Eyes:     General: No scleral icterus.    Pupils: Pupils are equal, round, and reactive to light.  Cardiovascular:     Rate and Rhythm: Normal rate and regular rhythm.     Heart sounds: No murmur heard. Pulmonary:     Effort: Pulmonary effort is normal. No respiratory distress.     Breath sounds: No rales.  Chest:     Chest wall: No tenderness.  Abdominal:     General: There is no distension.     Palpations: Abdomen is soft.     Tenderness: There is no abdominal tenderness.  Musculoskeletal:        General: Normal range of motion.     Cervical back: Normal range of motion and neck supple.     Comments: Left upper arm-lymphedema sleeve  Skin:    General: Skin is warm and dry.     Findings: No erythema.   Neurological:     Mental Status: She is alert and oriented to person, place, and time.  Psychiatric:        Mood and Affect: Affect normal.   Status post bilateral mastectomy,  a very small [0.5cm] mobile nodule felt at right mastectomy site. .  No palpable axillary lymph node adenopathy bilaterally.  Chronic left axillary scarring tissue.  Labs reviewed CBC Latest Ref Rng & Units 08/01/2020 11/18/2019 08/05/2019  WBC 4.0 - 10.5 K/uL 2.9(L) 2.6(L) 3.1(L)  Hemoglobin 12.0 - 15.0 g/dL 13.2 13.5 13.0  Hematocrit 36.0 - 46.0 % 39.4 40.1 37.7  Platelets 150 - 400 K/uL 148(L) 132(L) 145(L)   CMP Latest Ref Rng & Units 08/01/2020 11/18/2019 08/05/2019  Glucose 70 - 99 mg/dL 100(H) 112(H) 116(H)  BUN 8 - 23 mg/dL '16 10 14  '$ Creatinine 0.44 - 1.00 mg/dL 0.90 0.80 0.82  Sodium 135 - 145 mmol/L 136 139 137  Potassium 3.5 - 5.1 mmol/L 3.4(L) 3.8 4.2  Chloride 98 - 111 mmol/L 99 103 101  CO2 22 - 32 mmol/L '29 27 28  '$ Calcium 8.9 - 10.3 mg/dL 8.9 8.9 8.4(L)  Total Protein 6.5 - 8.1 g/dL 7.6 7.2 7.3  Total Bilirubin 0.3 - 1.2 mg/dL 0.5 0.7 0.7  Alkaline Phos 38 - 126 U/L 62 63 71  AST 15 - 41 U/L '24 19 19  '$ ALT 0 - 44 U/L '15 11 11   '$ RADIOGRAPHIC STUDIES: I have personally reviewed the radiological images as listed and agreed with the findings in the report. VAS [place] ABI WITH/WO TBI  Result Date: 07/13/2020  LOWER EXTREMITY DOPPLER STUDY Patient Name:  TESSLYN MCCAGUE  Date of Exam:   07/06/2020 Medical Rec #: [id number]      Accession #:    [id number] Date of Birth: 1950/04/21      Patient Gender: F Patient Age:   70Y Exam Location:  [facility] Procedure:      VAS [place] ABI WITH/WO TBI Referring Phys: --------------------------------------------------------------------------------  Indications: Decreased pedal pulses.  Performing Technologist: Almira Coaster RVS  Examination Guidelines: A complete evaluation includes at minimum, Doppler waveform signals and systolic blood pressure reading at the  level of bilateral brachial, anterior tibial, and posterior tibial arteries, when vessel segments are accessible. Bilateral testing is considered an integral part of a complete examination. Photoelectric Plethysmograph (PPG) waveforms and toe systolic pressure readings are included as required and additional duplex testing as needed. Limited examinations for reoccurring indications may be performed as noted.  ABI Findings: +---------+------------------+-----+---------+--------+ Right    Rt Pressure (mmHg)IndexWaveform Comment  +---------+------------------+-----+---------+--------+ Brachial 123                                      +---------+------------------+-----+---------+--------+ ATA      126               1.02 triphasic         +---------+------------------+-----+---------+--------+ PTA      121               0.98 triphasic         +---------+------------------+-----+---------+--------+ Great Toe128               1.04 Normal            +---------+------------------+-----+---------+--------+ +---------+------------------+-----+---------+-------+ Left     Lt Pressure (mmHg)IndexWaveform Comment +---------+------------------+-----+---------+-------+ ATA      128               1.04 triphasic        +---------+------------------+-----+---------+-------+ PTA      122  0.99 triphasic        +---------+------------------+-----+---------+-------+ Great Toe138               1.12 Normal           +---------+------------------+-----+---------+-------+ +-------+-----------+-----------+------------+------------+ ABI/TBIToday's ABIToday's TBIPrevious ABIPrevious TBI +-------+-----------+-----------+------------+------------+ Right  1.02       1.04                                +-------+-----------+-----------+------------+------------+ Left   1.04       1.12                                 +-------+-----------+-----------+------------+------------+  Summary: Right: Resting right ankle-brachial index is within normal range. No evidence of significant right lower extremity arterial disease. The right toe-brachial index is normal. Left: Resting left ankle-brachial index is within normal range. No evidence of significant left lower extremity arterial disease. The left toe-brachial index is normal.  *See table(s) above for measurements and observations.  Electronically signed by Leotis Pain MD on 07/13/2020 at 11:46:46 AM.    Final      Assessment:  TAMYRAH BLANCETT is a 70 y.o. female with a history of left breast cancer (2002), right sided stage III colon cancer (2014), and mild pancytopenia. 1. History of left breast cancer   2. Cancer of ascending colon ([place])   3. Lymphedema of left upper extremity   4. Leukopenia, unspecified type   5. Thrombocytopenia ([place])   6. B12 deficiency    #History of left breast cancer status post mastectomy bilaterally, adjuvant chemotherapy Clinically she is doing well.  Previous breast pathology was not available.  She is not on any adjuvant endocrine therapy. Labs are reviewed and discussed with patient. Very small lump at left mastectomy site, refer to Dr.Byrnett for further evaluation.   #History of ascending colon cancer, stage III, status post hemicolectomy and adjuvant chemotherapy. She has chronically elevated CEA.  08/01/2020 CEA 7.3 at baseline.  She is 7.5 years after her surgery. Clinically doing well.  Recommend her to continue her colonoscopy screening at frequency recommended by her gastroenterologist.    #Chronic pancytopenia including mild neutropenia with normal ANC, mild thrombocytopenia, no intervention for now.  Her counts are stable.  #History of bilateral mastectomy.  History of left upper extremity lymphedema, s follow-up with lymphedema clinic.   # History of B12 deficiency, B12 has normalized. Recommend B12 500-1021mg every  other day. Follow up with PCP.    If her chest wall nodule work up is negative, I think she can follow  up as needed at this point. She agrees with the plan.    ZEarlie Server MD  08/19/2020, 12:02 AM

## 2020-08-23 ENCOUNTER — Other Ambulatory Visit: Payer: Self-pay | Admitting: General Surgery

## 2020-08-23 DIAGNOSIS — D235 Other benign neoplasm of skin of trunk: Secondary | ICD-10-CM | POA: Diagnosis not present

## 2020-08-27 LAB — SURGICAL PATHOLOGY

## 2020-08-28 DIAGNOSIS — G620 Drug-induced polyneuropathy: Secondary | ICD-10-CM | POA: Diagnosis not present

## 2020-08-28 DIAGNOSIS — T451X5A Adverse effect of antineoplastic and immunosuppressive drugs, initial encounter: Secondary | ICD-10-CM | POA: Diagnosis not present

## 2020-08-28 DIAGNOSIS — R7303 Prediabetes: Secondary | ICD-10-CM | POA: Diagnosis not present

## 2020-10-25 DIAGNOSIS — H401122 Primary open-angle glaucoma, left eye, moderate stage: Secondary | ICD-10-CM | POA: Diagnosis not present

## 2020-11-26 DIAGNOSIS — R600 Localized edema: Secondary | ICD-10-CM | POA: Diagnosis not present

## 2020-11-26 DIAGNOSIS — H6123 Impacted cerumen, bilateral: Secondary | ICD-10-CM | POA: Diagnosis not present

## 2020-11-26 DIAGNOSIS — H8112 Benign paroxysmal vertigo, left ear: Secondary | ICD-10-CM | POA: Diagnosis not present

## 2020-12-04 DIAGNOSIS — H8112 Benign paroxysmal vertigo, left ear: Secondary | ICD-10-CM | POA: Diagnosis not present

## 2020-12-12 DIAGNOSIS — R42 Dizziness and giddiness: Secondary | ICD-10-CM | POA: Diagnosis not present

## 2020-12-26 DIAGNOSIS — H903 Sensorineural hearing loss, bilateral: Secondary | ICD-10-CM | POA: Diagnosis not present

## 2020-12-26 DIAGNOSIS — R42 Dizziness and giddiness: Secondary | ICD-10-CM | POA: Diagnosis not present

## 2020-12-26 DIAGNOSIS — H6123 Impacted cerumen, bilateral: Secondary | ICD-10-CM | POA: Diagnosis not present

## 2021-09-27 ENCOUNTER — Ambulatory Visit: Payer: Medicare PPO | Admitting: Anesthesiology

## 2021-09-27 ENCOUNTER — Other Ambulatory Visit: Payer: Self-pay

## 2021-09-27 ENCOUNTER — Ambulatory Visit
Admission: RE | Admit: 2021-09-27 | Discharge: 2021-09-27 | Disposition: A | Discharge status: Home / Self Care | Payer: Medicare PPO | Attending: Gastroenterology | Admitting: Gastroenterology

## 2021-09-27 ENCOUNTER — Encounter
Admission: RE | Disposition: A | Discharge status: Home / Self Care | Payer: Self-pay | Source: Home / Self Care | Attending: Gastroenterology

## 2021-09-27 ENCOUNTER — Encounter: Payer: Self-pay | Admitting: *Deleted

## 2021-09-27 DIAGNOSIS — Z9221 Personal history of antineoplastic chemotherapy: Secondary | ICD-10-CM | POA: Diagnosis not present

## 2021-09-27 DIAGNOSIS — H409 Unspecified glaucoma: Secondary | ICD-10-CM | POA: Insufficient documentation

## 2021-09-27 DIAGNOSIS — Z9049 Acquired absence of other specified parts of digestive tract: Secondary | ICD-10-CM | POA: Insufficient documentation

## 2021-09-27 DIAGNOSIS — Z923 Personal history of irradiation: Secondary | ICD-10-CM | POA: Insufficient documentation

## 2021-09-27 DIAGNOSIS — Z1211 Encounter for screening for malignant neoplasm of colon: Secondary | ICD-10-CM | POA: Insufficient documentation

## 2021-09-27 DIAGNOSIS — Z980 Intestinal bypass and anastomosis status: Secondary | ICD-10-CM | POA: Diagnosis not present

## 2021-09-27 DIAGNOSIS — Z9012 Acquired absence of left breast and nipple: Secondary | ICD-10-CM | POA: Insufficient documentation

## 2021-09-27 DIAGNOSIS — Z87891 Personal history of nicotine dependence: Secondary | ICD-10-CM | POA: Insufficient documentation

## 2021-09-27 DIAGNOSIS — Z853 Personal history of malignant neoplasm of breast: Secondary | ICD-10-CM | POA: Diagnosis not present

## 2021-09-27 DIAGNOSIS — Z85038 Personal history of other malignant neoplasm of large intestine: Secondary | ICD-10-CM | POA: Insufficient documentation

## 2021-09-27 HISTORY — PX: COLONOSCOPY WITH PROPOFOL: SHX5780

## 2021-09-27 SURGERY — COLONOSCOPY WITH PROPOFOL
Anesthesia: General

## 2021-09-27 MED ORDER — PROPOFOL 10 MG/ML IV BOLUS
INTRAVENOUS | Status: DC | PRN
  Administered 2021-09-27: 50 mg via INTRAVENOUS

## 2021-09-27 MED ORDER — PROPOFOL 500 MG/50ML IV EMUL
INTRAVENOUS | Status: DC | PRN
  Administered 2021-09-27: 140 ug/kg/min via INTRAVENOUS

## 2021-09-27 MED ORDER — SODIUM CHLORIDE 0.9 % IV SOLN: INTRAVENOUS | Status: DC

## 2021-09-27 NOTE — Interval H&P Note (Signed)
History and Physical Interval Note:  09/27/2021 10:39 AM  Amy Bird  has presented today for surgery, with the diagnosis of Z85.038  - History of colon cancer, stage III Z86.010  - Hx of adenomatous colonic polyps.  The various methods of treatment have been discussed with the patient and family. After consideration of risks, benefits and other options for treatment, the patient has consented to  Procedure(s): COLONOSCOPY WITH PROPOFOL (N/A) as a surgical intervention.  The patient's history has been reviewed, patient examined, no change in status, stable for surgery.  I have reviewed the patient's chart and labs.  Questions were answered to the patient's satisfaction.     Lesly Rubenstein  Ok to proceed with colonoscopy

## 2021-09-27 NOTE — H&P (Signed)
Outpatient short stay form Pre-procedure 09/27/2021  Lesly Rubenstein, MD  Primary Physician: Venia Carbon, MD  Reason for visit:  Surveillance colonoscopy  History of present illness:    71 y/o lady with history of colon cancer s/p right hemicolectomy here for surveillance colonoscopy. Last colonoscopy in 2018 unremarkable. Several family members with cancer history. No blood thinners.    Current Facility-Administered Medications:    0.9 %  sodium chloride infusion, , Intravenous, Continuous, Manuela Halbur, Hilton Cork, MD, Last Rate: 20 mL/hr at 09/27/21 1017, New Bag at 09/27/21 1017  Medications Prior to Admission  Medication Sig Dispense Refill Last Dose   Calcium Carb-Cholecalciferol (CALCIUM 600 + D PO) Take 1 tablet by mouth daily.       cholecalciferol (VITAMIN D3) 25 MCG (1000 UT) tablet Take 1,000 Units by mouth daily.      latanoprost (XALATAN) 0.005 % ophthalmic solution Place 1 drop into both eyes at bedtime.      timolol (TIMOPTIC) 0.5 % ophthalmic solution Apply to eye.      vitamin C (ASCORBIC ACID) 500 MG tablet Take 500 mg by mouth daily.        Allergies  Allergen Reactions   Hydrocodone-Acetaminophen Other (See Comments) and Shortness Of Breath    Not witnessed info gathered as part of patient screening.  PT states her chest felt very heavy and difficulty breathing.   Sulfa Antibiotics Swelling   Cefuroxime Diarrhea   Hydrocodone-Ibuprofen Other (See Comments)    Patient feels like there is a heavy wight on her chest   Levofloxacin Hives   Statins Other (See Comments)    Leg cramps and groin pain   Other Itching    Tree Nuts   Tape Rash   Tree Extract Itching     Past Medical History:  Diagnosis Date   Anemia    Arthritis    Breast cancer, left breast ([place]) 2002   Left Mastectomy, chemo + Rad tx's.   Colon cancer ([place]) 10/2012   Partial colon resection and chemo tx's.   Diverticulitis    Glaucoma    Hearing loss    Hyperlipidemia 20 years    Kidney stone    Lymphedema of left arm    Neuromuscular disorder (HCC)    neuropathy  feet and hands from chemo tx   Pneumonia    Shingles    Vertigo    last episode around Jan 2018    Review of systems:  Otherwise negative.    Physical Exam  Gen: Alert, oriented. Appears stated age.  HEENT: PERRLA. Lungs: No respiratory distress CV: RRR Abd: soft, benign, no masses Ext: No edema    Planned procedures: Proceed with colonoscopy. The patient understands the nature of the planned procedure, indications, risks, alternatives and potential complications including but not limited to bleeding, infection, perforation, damage to internal organs and possible oversedation/side effects from anesthesia. The patient agrees and gives consent to proceed.  Please refer to procedure notes for findings, recommendations and patient disposition/instructions.     Lesly Rubenstein, MD [facility]

## 2021-09-27 NOTE — Anesthesia Preprocedure Evaluation (Signed)
Anesthesia Evaluation  Patient identified by MRN, date of birth, ID band Patient awake    Reviewed: Allergy & Precautions, NPO status , Patient's Chart, lab work & pertinent test results  History of Anesthesia Complications Negative for: history of anesthetic complications  Airway Mallampati: III  TM Distance: >3 FB Neck ROM: full    Dental  (+) Chipped   Pulmonary neg pulmonary ROS, neg shortness of breath, former smoker,    Pulmonary exam normal        Cardiovascular Exercise Tolerance: Good (-) angina(-) Past MI negative cardio ROS Normal cardiovascular exam     Neuro/Psych  Neuromuscular disease negative psych ROS   GI/Hepatic negative GI ROS, Neg liver ROS,   Endo/Other  negative endocrine ROS  Renal/GU Renal disease  negative genitourinary   Musculoskeletal   Abdominal   Peds  Hematology negative hematology ROS (+)   Anesthesia Other Findings Past Medical History: No date: Anemia No date: Arthritis 2002: Breast cancer, left breast ([place])     Comment:  Left Mastectomy, chemo + Rad tx's. 10/2012: Colon cancer ([place])     Comment:  Partial colon resection and chemo tx's. No date: Diverticulitis No date: Glaucoma No date: Hearing loss 20 years: Hyperlipidemia No date: Kidney stone No date: Lymphedema of left arm No date: Neuromuscular disorder ([place])     Comment:  neuropathy  feet and hands from chemo tx No date: Pneumonia No date: Shingles No date: Vertigo     Comment:  last episode around Jan 2018  Past Surgical History: 1973: ABDOMINAL HYSTERECTOMY 1969: APPENDECTOMY 07/11/2015: CATARACT EXTRACTION W/PHACO; Left     Comment:  Procedure: CATARACT EXTRACTION PHACO AND INTRAOCULAR               LENS PLACEMENT ([place]) left eye;  Surgeon: Leandrew Koyanagi, MD;  Location: [place];  Service:              Ophthalmology;  Laterality: Left; 08/27/2016: CATARACT EXTRACTION W/PHACO;  Right     Comment:  Procedure: CATARACT EXTRACTION PHACO AND INTRAOCULAR               LENS PLACEMENT (IOC);  Surgeon: Leandrew Koyanagi, MD;              Location: [place];  Service: Ophthalmology;                Laterality: Right;  IVA TOPICAL RIGHT No date: CESAREAN SECTION No date: COLON RESECTION 2004: COLONOSCOPY 04/04/2016: COLONOSCOPY; N/A     Comment:  Procedure: COLONOSCOPY;  Surgeon: Lollie Sails, MD;              Location: [facility] ENDOSCOPY;  Service: Endoscopy;                Laterality: N/A; 10/01/2018: COLONOSCOPY WITH PROPOFOL; N/A     Comment:  Procedure: COLONOSCOPY WITH PROPOFOL;  Surgeon: [name], MD;  Location: [place];  Service:               Endoscopy;  Laterality: N/A; 04/04/2016: ESOPHAGOGASTRODUODENOSCOPY; N/A     Comment:  Procedure: ESOPHAGOGASTRODUODENOSCOPY (EGD);  Surgeon:               Lollie Sails, MD;  Location: [facility] ENDOSCOPY;  Service: Endoscopy;  Laterality: N/A; No date: kidney stone removal  2002,2010: MASTECTOMY; Bilateral 1973: salpingo oophorectmy   BMI    Body Mass Index: 21.95 kg/m      Reproductive/Obstetrics negative OB ROS                             Anesthesia Physical Anesthesia Plan  ASA: 3  Anesthesia Plan: General   Post-op Pain Management:    Induction: Intravenous  PONV Risk Score and Plan: Propofol infusion and TIVA  Airway Management Planned: Natural Airway and Nasal Cannula  Additional Equipment:   Intra-op Plan:   Post-operative Plan:   Informed Consent: I have reviewed the patients History and Physical, chart, labs and discussed the procedure including the risks, benefits and alternatives for the proposed anesthesia with the patient or authorized representative who has indicated his/her understanding and acceptance.     Dental Advisory Given  Plan Discussed with: Anesthesiologist, CRNA and Surgeon  Anesthesia  Plan Comments: (Patient consented for risks of anesthesia including but not limited to:  - adverse reactions to medications - risk of airway placement if required - damage to eyes, teeth, lips or other oral mucosa - nerve damage due to positioning  - sore throat or hoarseness - Damage to heart, brain, nerves, lungs, other parts of body or loss of life  Patient voiced understanding.)        Anesthesia Quick Evaluation

## 2021-09-27 NOTE — Op Note (Signed)
[facility] Gastroenterology Patient Name: Amy Bird Procedure Date: 09/27/2021 10:41 AM MRN: [id number] Account #: [id number] Date of Birth: 1950-04-03 Admit Type: Outpatient Age: 71 Room: [facility] ENDO ROOM 3 Gender: Female Note Status: Finalized Instrument Name: Jasper Riling [id number] Procedure:             Colonoscopy Indications:           High risk colon cancer surveillance: Personal history                         of colon cancer Providers:             Andrey Farmer MD, MD Referring MD:          Venia Carbon (Referring MD) Medicines:             Monitored Anesthesia Care Complications:         No immediate complications. Procedure:             Pre-Anesthesia Assessment:                        - Prior to the procedure, a History and Physical was                         performed, and patient medications and allergies were                         reviewed. The patient is competent. The risks and                         benefits of the procedure and the sedation options and                         risks were discussed with the patient. All questions                         were answered and informed consent was obtained.                         Patient identification and proposed procedure were                         verified by the physician, the nurse, the                         anesthesiologist, the anesthetist and the technician                         in the endoscopy suite. Mental Status Examination:                         alert and oriented. Airway Examination: normal                         oropharyngeal airway and neck mobility. Respiratory                         Examination: clear to auscultation. CV Examination:  normal. Prophylactic Antibiotics: The patient does not                         require prophylactic antibiotics. Prior                         Anticoagulants: The patient has taken no previous                          anticoagulant or antiplatelet agents. ASA Grade                         Assessment: II - A patient with mild systemic disease.                         After reviewing the risks and benefits, the patient                         was deemed in satisfactory condition to undergo the                         procedure. The anesthesia plan was to use monitored                         anesthesia care (MAC). Immediately prior to                         administration of medications, the patient was                         re-assessed for adequacy to receive sedatives. The                         heart rate, respiratory rate, oxygen saturations,                         blood pressure, adequacy of pulmonary ventilation, and                         response to care were monitored throughout the                         procedure. The physical status of the patient was                         re-assessed after the procedure.                        After obtaining informed consent, the colonoscope was                         passed under direct vision. Throughout the procedure,                         the patient's blood pressure, pulse, and oxygen                         saturations were monitored continuously. The  Colonoscope was introduced through the anus and                         advanced to the the ileocolonic anastomosis. The                         colonoscopy was somewhat difficult due to significant                         looping. Successful completion of the procedure was                         aided by applying abdominal pressure. The patient                         tolerated the procedure well. The quality of the bowel                         preparation was good. Findings:      The perianal and digital rectal examinations were normal.      There was evidence of a prior end-to-side ileo-colonic anastomosis in       the ascending colon. This was patent and was  characterized by healthy       appearing mucosa.      The exam was otherwise without abnormality on direct and retroflexion       views. Impression:            - Patent end-to-side ileo-colonic anastomosis,                         characterized by healthy appearing mucosa.                        - The examination was otherwise normal on direct and                         retroflexion views.                        - No specimens collected. Recommendation:        - Discharge patient to home.                        - Resume previous diet.                        - Continue present medications.                        - Repeat colonoscopy in 5 years for surveillance.                        - Return to referring physician as previously                         scheduled. Procedure Code(s):     --- Professional ---                        X1062, Colorectal cancer screening; colonoscopy on  individual at high risk Diagnosis Code(s):     --- Professional ---                        [phone], Personal history of other malignant neoplasm                         of large intestine                        Z98.0, Intestinal bypass and anastomosis status CPT copyright 2019 American Medical Association. All rights reserved. The codes documented in this report are preliminary and upon coder review may  be revised to meet current compliance requirements. Andrey Farmer MD, MD 09/27/2021 11:03:40 AM Number of Addenda: 0 Note Initiated On: 09/27/2021 10:41 AM Scope Withdrawal Time: 0 hours 6 minutes 13 seconds  Total Procedure Duration: 0 hours 13 minutes 50 seconds  Estimated Blood Loss:  Estimated blood loss: none.      [facility]

## 2021-09-27 NOTE — Transfer of Care (Signed)
Immediate Anesthesia Transfer of Care Note  Patient: Amy Bird  Procedure(s) Performed: COLONOSCOPY WITH PROPOFOL  Patient Location: PACU  Anesthesia Type:General  Level of Consciousness: drowsy  Airway & Oxygen Therapy: Patient Spontanous Breathing  Post-op Assessment: Report given to RN and Post -op Vital signs reviewed and stable  Post vital signs: Reviewed and stable  Last Vitals:  Vitals Value Taken Time  BP    Temp    Pulse    Resp    SpO2      Last Pain:  Vitals:   09/27/21 1008  TempSrc: Temporal  PainSc: 0-No pain         Complications: No notable events documented.

## 2021-09-27 NOTE — Anesthesia Postprocedure Evaluation (Signed)
Anesthesia Post Note  Patient: Amy Bird  Procedure(s) Performed: COLONOSCOPY WITH PROPOFOL  Patient location during evaluation: Endoscopy Anesthesia Type: General Level of consciousness: awake and alert Pain management: pain level controlled Vital Signs Assessment: post-procedure vital signs reviewed and stable Respiratory status: spontaneous breathing, nonlabored ventilation, respiratory function stable and patient connected to nasal cannula oxygen Cardiovascular status: blood pressure returned to baseline and stable Postop Assessment: no apparent nausea or vomiting Anesthetic complications: no   No notable events documented.   Last Vitals:  Vitals:   09/27/21 1124 09/27/21 1134  BP: 99/87 (!) 131/97  Pulse: 62 60  Resp: 17 17  Temp:    SpO2: 100% 100%    Last Pain:  Vitals:   09/27/21 1134  TempSrc:   PainSc: 0-No pain                 Precious Haws Ewelina Naves

## 2021-11-18 ENCOUNTER — Encounter (INDEPENDENT_AMBULATORY_CARE_PROVIDER_SITE_OTHER): Payer: Self-pay

## 2021-12-16 ENCOUNTER — Other Ambulatory Visit: Payer: Self-pay | Admitting: Gastroenterology

## 2021-12-16 DIAGNOSIS — R634 Abnormal weight loss: Secondary | ICD-10-CM

## 2021-12-16 DIAGNOSIS — R102 Pelvic and perineal pain: Secondary | ICD-10-CM

## 2021-12-16 DIAGNOSIS — Z87891 Personal history of nicotine dependence: Secondary | ICD-10-CM

## 2022-01-03 ENCOUNTER — Ambulatory Visit
Admission: RE | Admit: 2022-01-03 | Discharge: 2022-01-03 | Disposition: A | Discharge status: Home / Self Care | Payer: Medicare PPO | Source: Ambulatory Visit | Attending: Gastroenterology | Admitting: Gastroenterology

## 2022-01-03 DIAGNOSIS — Z87891 Personal history of nicotine dependence: Secondary | ICD-10-CM | POA: Diagnosis present

## 2022-01-03 DIAGNOSIS — K6389 Other specified diseases of intestine: Secondary | ICD-10-CM | POA: Diagnosis not present

## 2022-01-03 DIAGNOSIS — E041 Nontoxic single thyroid nodule: Secondary | ICD-10-CM | POA: Insufficient documentation

## 2022-01-03 DIAGNOSIS — R102 Pelvic and perineal pain: Secondary | ICD-10-CM | POA: Insufficient documentation

## 2022-01-03 DIAGNOSIS — K760 Fatty (change of) liver, not elsewhere classified: Secondary | ICD-10-CM | POA: Diagnosis not present

## 2022-01-03 DIAGNOSIS — I700 Atherosclerosis of aorta: Secondary | ICD-10-CM | POA: Insufficient documentation

## 2022-01-03 DIAGNOSIS — R634 Abnormal weight loss: Secondary | ICD-10-CM | POA: Insufficient documentation

## 2022-01-03 LAB — POCT I-STAT CREATININE: Creatinine, Ser: 0.9 mg/dL (ref 0.44–1.00)

## 2022-01-03 MED ORDER — IOHEXOL 300 MG/ML  SOLN
75.0000 mL | Freq: Once | INTRAMUSCULAR | Status: AC | PRN
  Administered 2022-01-03: 75 mL via INTRAVENOUS

## 2022-01-06 ENCOUNTER — Other Ambulatory Visit: Payer: Self-pay | Admitting: Gastroenterology

## 2022-01-06 DIAGNOSIS — E041 Nontoxic single thyroid nodule: Secondary | ICD-10-CM

## 2022-01-10 ENCOUNTER — Ambulatory Visit
Admission: RE | Admit: 2022-01-10 | Discharge: 2022-01-10 | Disposition: A | Discharge status: Home / Self Care | Payer: Medicare PPO | Source: Ambulatory Visit | Attending: Gastroenterology | Admitting: Gastroenterology

## 2022-01-10 DIAGNOSIS — E041 Nontoxic single thyroid nodule: Secondary | ICD-10-CM | POA: Diagnosis present

## 2022-08-26 ENCOUNTER — Telehealth: Payer: Self-pay

## 2022-08-26 NOTE — Telephone Encounter (Signed)
Retuned pt call. Pt states that she was released from seeing Dr. Cathie Hoops in 2022 and was further following with PCP. She is trying to get an order for compression sleeve for Lymphadema, but PCP will not put in his notes that she has lymphadema. Pt requesting to re-establish care so we can provide her with an order for compression sleeve for lymphadema.   Please advise on follow up to re-establish care. does she need labs?

## 2022-08-27 NOTE — Telephone Encounter (Signed)
Please schedule pt next week on a lighter schedule day, MD only. Please inform pt of appt, she is expecting a call.

## 2022-10-13 ENCOUNTER — Ambulatory Visit: Payer: Medicare PPO | Attending: Internal Medicine | Admitting: Occupational Therapy

## 2022-10-13 DIAGNOSIS — I972 Postmastectomy lymphedema syndrome: Secondary | ICD-10-CM | POA: Insufficient documentation

## 2022-10-13 NOTE — Therapy (Signed)
[facility] [address] [place], [address] Phone: [phone]   Fax:  [phone]  Occupational Therapy Screen  Patient Details  Name: Amy Bird MRN: [id number] Date of Birth: 14-Dec-1950 No data recorded  Encounter Date: 10/13/2022   OT End of Session - 10/13/22 1849     Visit Number 0             Past Medical History:  Diagnosis Date   Anemia    Arthritis    Breast cancer, left breast (HCC) 2002   Left Mastectomy, chemo + Rad tx's.   Colon cancer (HCC) 10/2012   Partial colon resection and chemo tx's.   Diverticulitis    Glaucoma    Hearing loss    Hyperlipidemia 20 years   Kidney stone    Lymphedema of left arm    Neuromuscular disorder (HCC)    neuropathy  feet and hands from chemo tx   Pneumonia    Shingles    Vertigo    last episode around Jan 2018    Past Surgical History:  Procedure Laterality Date   ABDOMINAL HYSTERECTOMY  1973   APPENDECTOMY  1969   CATARACT EXTRACTION W/PHACO Left 07/11/2015   Procedure: CATARACT EXTRACTION PHACO AND INTRAOCULAR LENS PLACEMENT (IOC) left eye;  Surgeon: Lockie Mola, MD;  Location: [facility];  Service: Ophthalmology;  Laterality: Left;   CATARACT EXTRACTION W/PHACO Right 08/27/2016   Procedure: CATARACT EXTRACTION PHACO AND INTRAOCULAR LENS PLACEMENT (IOC);  Surgeon: Lockie Mola, MD;  Location: [facility];  Service: Ophthalmology;  Laterality: Right;  IVA TOPICAL RIGHT   CESAREAN SECTION     COLON RESECTION     COLONOSCOPY  2004   COLONOSCOPY N/A 04/04/2016   Procedure: COLONOSCOPY;  Surgeon: Christena Deem, MD;  Location: [facility] ENDOSCOPY;  Service: Endoscopy;  Laterality: N/A;   COLONOSCOPY WITH PROPOFOL N/A 10/01/2018   Procedure: COLONOSCOPY WITH PROPOFOL;  Surgeon: Midge Minium, MD;  Location: [facility];  Service: Endoscopy;  Laterality: N/A;   COLONOSCOPY WITH PROPOFOL N/A 09/27/2021   Procedure:  COLONOSCOPY WITH PROPOFOL;  Surgeon: Regis Bill, MD;  Location: [facility] ENDOSCOPY;  Service: Endoscopy;  Laterality: N/A;   ESOPHAGOGASTRODUODENOSCOPY N/A 04/04/2016   Procedure: ESOPHAGOGASTRODUODENOSCOPY (EGD);  Surgeon: Christena Deem, MD;  Location: [facility];  Service: Endoscopy;  Laterality: N/A;   kidney stone removal      MASTECTOMY Bilateral 2002,2010   salpingo oophorectmy   1973    There were no vitals filed for this visit.        LYMPHEDEMA/ONCOLOGY QUESTIONNAIRE - 10/13/22 0001       Right Upper Extremity Lymphedema   15 cm Proximal to Olecranon Process 27 cm    10 cm Proximal to Olecranon Process 28 cm    Olecranon Process 22.5 cm    15 cm Proximal to Ulnar Styloid Process 22 cm    Just Proximal to Ulnar Styloid Process 14.8 cm      Left Upper Extremity Lymphedema   15 cm Proximal to Olecranon Process 27.5 cm    10 cm Proximal to Olecranon Process 27.5 cm    Olecranon Process 22.5 cm    15 cm Proximal to Ulnar Styloid Process 22.1 cm    Just Proximal to Ulnar Styloid Process 14 cm              05/05/19 OT note: Since in Febr pt was fitted with 2 new [employer] soft sleeves -  but both bubbled at upper arm sleeve - she did not even bring 2nd one in for me to look at - she told Rep it will not work the 2nd one    Pt was fitted with Rep from [employer] with new brand  [employer] - it started bubbling little at wrist at seam -but not that it bothers her - fit is good And the containment works well - her measurements same that Febr - do have narrow band but staying up - size and length looks good  She do have little harder time getting wrist over the hand - told pt to contact [employer] if she had hard time and it bothers her R hand - to go get slippy gator sleeve to help her with ease to donn compression sleeve        OT SCREEN 10/19/22:  Pt was fitted by [facility] medical with [employer] -patient came by for OT to assess fitting of garment  compared to previous 1.   Patient fell that wrist little bit too loose.   Reminded patient that the previous one was on the tighter side.   Patient started wearing it about 2 weeks ago.   Upon measuring circumference of right as well as bilateral appear to provide excellent containment  Fitting appears very well.   Remind patient again that garments should be replaced about 7 to 9 months if wearing every day.    See flowsheet at the top                                 Visit Diagnosis: Post-mastectomy lymphedema syndrome    Problem List Patient Active Problem List   Diagnosis Date Noted   Pelvic pain in female 07/06/2020   Pain in limb 07/06/2020   Personal history of colon cancer    Preventative health care 04/01/2018   Aortic atherosclerosis (HCC) 04/01/2018   Chronic renal disease, stage III (HCC) 04/01/2018   Advance directive discussed with patient 04/01/2018   Elevated tumor markers 11/15/2017   Thrombocytopenia (HCC) 08/27/2017   Lymphedema of left upper extremity 02/23/2017   B12 deficiency 02/10/2016   History of left breast cancer 09/17/2015   Leukopenia 09/17/2015   Absolute anemia 11/08/2014   Neutropenia (HCC) 11/08/2014   Peripheral neuropathy due to chemotherapy (HCC) 08/10/2014   Dyspepsia 08/10/2014   Allergic rhinitis 01/29/2005    Oletta Cohn, OTR/L,CLT 10/13/2022, 6:51 PM  [facility] [address] [place], [address] Phone: [phone]   Fax:  [phone]  Name: Amy Bird MRN: [id number] Date of Birth: 12/18/1950

## 2022-12-15 ENCOUNTER — Other Ambulatory Visit: Payer: Self-pay

## 2022-12-15 ENCOUNTER — Ambulatory Visit: Payer: Medicare PPO | Attending: Internal Medicine | Admitting: Occupational Therapy

## 2022-12-15 ENCOUNTER — Emergency Department: Payer: Medicare PPO

## 2022-12-15 ENCOUNTER — Emergency Department
Admission: EM | Admit: 2022-12-15 | Discharge: 2022-12-15 | Disposition: A | Discharge status: Home / Self Care | Payer: Medicare PPO | Attending: Student in an Organized Health Care Education/Training Program | Admitting: Student in an Organized Health Care Education/Training Program

## 2022-12-15 DIAGNOSIS — M79602 Pain in left arm: Secondary | ICD-10-CM | POA: Diagnosis present

## 2022-12-15 DIAGNOSIS — I808 Phlebitis and thrombophlebitis of other sites: Secondary | ICD-10-CM

## 2022-12-15 DIAGNOSIS — I82612 Acute embolism and thrombosis of superficial veins of left upper extremity: Secondary | ICD-10-CM | POA: Diagnosis not present

## 2022-12-15 DIAGNOSIS — I972 Postmastectomy lymphedema syndrome: Secondary | ICD-10-CM | POA: Insufficient documentation

## 2022-12-15 LAB — CBC
HCT: 37.9 % (ref 36.0–46.0)
Hemoglobin: 12.7 g/dL (ref 12.0–15.0)
MCH: 32.2 pg (ref 26.0–34.0)
MCHC: 33.5 g/dL (ref 30.0–36.0)
MCV: 96.2 fL (ref 80.0–100.0)
Platelets: 147 10*3/uL — ABNORMAL LOW (ref 150–400)
RBC: 3.94 MIL/uL (ref 3.87–5.11)
RDW: 12 % (ref 11.5–15.5)
WBC: 3.4 10*3/uL — ABNORMAL LOW (ref 4.0–10.5)
nRBC: 0 % (ref 0.0–0.2)

## 2022-12-15 LAB — BASIC METABOLIC PANEL
Anion gap: 7 (ref 5–15)
BUN: 14 mg/dL (ref 8–23)
CO2: 29 mmol/L (ref 22–32)
Calcium: 8.8 mg/dL — ABNORMAL LOW (ref 8.9–10.3)
Chloride: 101 mmol/L (ref 98–111)
Creatinine, Ser: 0.85 mg/dL (ref 0.44–1.00)
GFR, Estimated: >60 mL/min (ref >60)
Glucose, Bld: 92 mg/dL (ref 70–99)
Potassium: 3.9 mmol/L (ref 3.5–5.1)
Sodium: 137 mmol/L (ref 135–145)

## 2022-12-15 NOTE — Therapy (Signed)
[facility] [address], [place], [address] Phone: [phone]   Fax:  [phone]  Occupational Therapy Screen  Patient Details  Name: Amy Bird MRN: [id number] Date of Birth: 04-17-1950 No data recorded  Encounter Date: 12/15/2022   OT End of Session - 12/15/22 1339     Visit Number 0             Past Medical History:  Diagnosis Date   Anemia    Arthritis    Breast cancer, left breast (HCC) 2002   Left Mastectomy, chemo + Rad tx's.   Colon cancer (HCC) 10/2012   Partial colon resection and chemo tx's.   Diverticulitis    Glaucoma    Hearing loss    Hyperlipidemia 20 years   Kidney stone    Lymphedema of left arm    Neuromuscular disorder (HCC)    neuropathy  feet and hands from chemo tx   Pneumonia    Shingles    Vertigo    last episode around Jan 2018    Past Surgical History:  Procedure Laterality Date   ABDOMINAL HYSTERECTOMY  1973   APPENDECTOMY  1969   CATARACT EXTRACTION W/PHACO Left 07/11/2015   Procedure: CATARACT EXTRACTION PHACO AND INTRAOCULAR LENS PLACEMENT (IOC) left eye;  Surgeon: Lockie Mola, MD;  Location: [facility];  Service: Ophthalmology;  Laterality: Left;   CATARACT EXTRACTION W/PHACO Right 08/27/2016   Procedure: CATARACT EXTRACTION PHACO AND INTRAOCULAR LENS PLACEMENT (IOC);  Surgeon: Lockie Mola, MD;  Location: [facility];  Service: Ophthalmology;  Laterality: Right;  IVA TOPICAL RIGHT   CESAREAN SECTION     COLON RESECTION     COLONOSCOPY  2004   COLONOSCOPY N/A 04/04/2016   Procedure: COLONOSCOPY;  Surgeon: Christena Deem, MD;  Location: [facility] ENDOSCOPY;  Service: Endoscopy;  Laterality: N/A;   COLONOSCOPY WITH PROPOFOL N/A 10/01/2018   Procedure: COLONOSCOPY WITH PROPOFOL;  Surgeon: Midge Minium, MD;  Location: [facility];  Service: Endoscopy;  Laterality: N/A;   COLONOSCOPY WITH PROPOFOL N/A 09/27/2021   Procedure:  COLONOSCOPY WITH PROPOFOL;  Surgeon: Regis Bill, MD;  Location: [facility] ENDOSCOPY;  Service: Endoscopy;  Laterality: N/A;   ESOPHAGOGASTRODUODENOSCOPY N/A 04/04/2016   Procedure: ESOPHAGOGASTRODUODENOSCOPY (EGD);  Surgeon: Christena Deem, MD;  Location: [facility] ENDOSCOPY;  Service: Endoscopy;  Laterality: N/A;   kidney stone removal      MASTECTOMY Bilateral 2002,2010   salpingo oophorectmy   1973    There were no vitals filed for this visit.   OT SCREEN 12/15/22:  Patient arrived today with reports of increased swelling and tenderness over the left elbow the last 2 days. Patient with a compression sleeve in place. Patient denies having blood pressure taken, needlesticks or shots or IVs. Patient circumference increased but denies any activities that could irritate cubital tunnel or lymphedema symptoms and signs. Appear patient's has veins that it is protruded as well as tender and hot. Recommend for patient to go to walk-in clinic to rule out blood clot.  Patient to follow-up with me as needed.     LYMPHEDEMA/ONCOLOGY QUESTIONNAIRE - 12/15/22 0001       Left Upper Extremity Lymphedema   15 cm Proximal to Olecranon Process 27 cm    10 cm Proximal to Olecranon Process 28 cm    Olecranon Process 23.8 cm    15 cm Proximal to Ulnar Styloid Process 22.5 cm    Just Proximal to Ulnar Styloid Process  14 cm                                                 Visit Diagnosis: Post-mastectomy lymphedema syndrome    Problem List Patient Active Problem List   Diagnosis Date Noted   Pelvic pain in female 07/06/2020   Pain in limb 07/06/2020   Personal history of colon cancer    Preventative health care 04/01/2018   Aortic atherosclerosis (HCC) 04/01/2018   Chronic renal disease, stage III (HCC) 04/01/2018   Advance directive discussed with patient 04/01/2018   Elevated tumor markers 11/15/2017   Thrombocytopenia (HCC) 08/27/2017   Lymphedema of  left upper extremity 02/23/2017   B12 deficiency 02/10/2016   History of left breast cancer 09/17/2015   Leukopenia 09/17/2015   Absolute anemia 11/08/2014   Neutropenia (HCC) 11/08/2014   Peripheral neuropathy due to chemotherapy (HCC) 08/10/2014   Dyspepsia 08/10/2014   Allergic rhinitis 01/29/2005    Oletta Cohn, OTR/L,CLT 12/15/2022, 1:42 PM  [facility] [address], [place], [address] Phone: [phone]   Fax:  [phone]  Name: Amy Bird MRN: [id number] Date of Birth: March 24, 1950

## 2022-12-15 NOTE — ED Provider Notes (Signed)
[facility] Provider Note    Event Date/Time   First MD Initiated Contact with Patient 12/15/22 1642     (approximate)   History   Arm Pain   HPI  Amy Bird is a 72 y.o. female with a history of chronic edema of the left upper extremity presents to the ER for evaluation of pain discomfort to the left medial elbow is noted at physical therapy today sent over to the ER for further evaluation concern for DVT.  Denies any fevers.  No chest pain or shortness of breath.  No recent trauma or injury to the area or venipuncture.     Physical Exam   Triage Vital Signs: ED Triage Vitals [12/15/22 1429]  Encounter Vitals Group     BP 122/67     Systolic BP Percentile      Diastolic BP Percentile      Pulse Rate 70     Resp 20     Temp 98.1 F (36.7 C)     Temp Source Oral     SpO2 100 %     Weight      Height      Head Circumference      Peak Flow      Pain Score 7     Pain Loc      Pain Education      Exclude from Growth Chart     Most recent vital signs: Vitals:   12/15/22 1429  BP: 122/67  Pulse: 70  Resp: 20  Temp: 98.1 F (36.7 C)  SpO2: 100%     Constitutional: Alert  Eyes: Conjunctivae are normal.  Head: Atraumatic. Nose: No congestion/rhinnorhea. Mouth/Throat: Mucous membranes are moist.   Neck: Painless ROM.  Cardiovascular:   Good peripheral circulation. Respiratory: Normal respiratory effort.  No retractions.  Gastrointestinal: Soft and nontender.  Musculoskeletal:  no deformity Neurologic:  MAE spontaneously. No gross focal neurologic deficits are appreciated.  Skin:  Skin is warm, dry and intact. No rash noted.  Area of tenderness and warmth consistent with superficial thrombophlebitis in the medial left elbow.  No fluctuance to suggest abscess or surrounding cellulitis. Psychiatric: Mood and affect are normal. Speech and behavior are normal.    ED Results / Procedures / Treatments   Labs (all labs ordered are  listed, but only abnormal results are displayed) Labs Reviewed  CBC - Abnormal; Notable for the following components:      Result Value   WBC 3.4 (*)    Platelets 147 (*)    All other components within normal limits  BASIC METABOLIC PANEL - Abnormal; Notable for the following components:   Calcium 8.8 (*)    All other components within normal limits     EKG     RADIOLOGY Please see ED Course for my review and interpretation.  I personally reviewed all radiographic images ordered to evaluate for the above acute complaints and reviewed radiology reports and findings.  These findings were personally discussed with the patient.  Please see medical record for radiology report.    PROCEDURES:  Critical Care performed:   Procedures   MEDICATIONS ORDERED IN ED: Medications - No data to display   IMPRESSION / MDM / ASSESSMENT AND PLAN / ED COURSE  I reviewed the triage vital signs and the nursing notes.  Differential diagnosis includes, but is not limited to, DVT, SVT, cellulitis, abscess  Patient presenting to the ER for evaluation of symptoms as described above.  Based on symptoms, risk factors and considered above differential, this presenting complaint could reflect a potentially life-threatening illness therefore the patient will be placed on continuous pulse oximetry and telemetry for monitoring.  Laboratory evaluation will be sent to evaluate for the above complaints.  Ultrasound ordered to evaluate for DVT does not show evidence of DVT there is evidence of superficial venous thrombosis.  Does appear appropriate for outpatient follow-up.  We discussed supportive care findings for which patient should return to the ER.       FINAL CLINICAL IMPRESSION(S) / ED DIAGNOSES   Final diagnoses:  Superficial thrombophlebitis of left upper extremity     Rx / DC Orders   ED Discharge Orders     None        Note:  This document was  prepared using Dragon voice recognition software and may include unintentional dictation errors.    Willy Eddy, MD 12/15/22 [phone]

## 2022-12-15 NOTE — ED Triage Notes (Signed)
Pt to ED via POV from home. Pt reports hx of lymphedema in left arm and wears sleeve. Pt reports yesterday noticed swelling to inner elbow and feels like a not. Area is red and tender to touch. Pt denies fever, SOB, CP, N/V/D.

## 2023-02-18 ENCOUNTER — Telehealth: Payer: Self-pay | Admitting: Oncology

## 2023-02-18 NOTE — Telephone Encounter (Signed)
Patient called to report that she had abnormal labs back in November (elevated WBCs---available in chart review). She was last seen by Dr. Cathie Hoops just under 3 years ago in July of 2022.   Please advise.

## 2023-04-13 ENCOUNTER — Telehealth: Payer: Self-pay | Admitting: *Deleted

## 2023-04-13 NOTE — Telephone Encounter (Signed)
 Pt thought that the MD oncology did she need to come back or not. The last time that the pt seen YU was 08/09/20

## 2023-04-13 NOTE — Telephone Encounter (Signed)
 I tried to callher to say that I gave the info to the team of Dr. Cathie Hoops and was not able to call the pt. And no voice mail

## 2023-04-14 ENCOUNTER — Telehealth: Payer: Self-pay | Admitting: *Deleted

## 2023-04-14 NOTE — Telephone Encounter (Signed)
 The patient last time she saw Cathie Hoops was 08/17/20. She says she would like an appt. Since it has been so long ago from last appt. And labs done

## 2023-04-14 NOTE — Telephone Encounter (Addendum)
 Looks like pt called in Jan regarding same issue   Message from [facility]:  "Patient called to report that she had abnormal labs back in November (elevated WBCs---available in chart review). She was last seen by Dr. Cathie Hoops just under 3 years ago in July of 2022.    Please advise."

## 2023-04-14 NOTE — Telephone Encounter (Signed)
 Please schedule MD followed by labs.

## 2023-05-28 ENCOUNTER — Inpatient Hospital Stay: Attending: Oncology

## 2023-05-28 NOTE — Progress Notes (Signed)
 Nutrition  Patient came to cancer center without an appointment asking to speak with RD.    RD came to lobby area and met with patient.  She reports bout of diarrhea and was told to start probiotic because it would be a little bit before she could be seen by GI.  She says she has an appointment in July to see GI doctor.  Says that she has been taking the probiotic for about a month and now has constipation. Does not feel like her diet has changed.  Wanting to know what foods she could eat to help with constipation.  Discussed briefly foods to choose for constipation Encouraged hydration Consider holding probiotic or taking every other day to see if that makes a difference in stool consistency Encouraged her to follow up with GI, PCP or medical oncology if not improving.   Patient wanted to know upcoming schedule for Cooking Class at the [facility] because she has been to a class before.  Provided upcoming schedule.   Amy Bird B. Zollie Hipp, CSO, LDN Registered Dietitian [phone]

## 2023-07-22 ENCOUNTER — Inpatient Hospital Stay: Attending: Oncology | Admitting: Occupational Therapy

## 2023-07-22 DIAGNOSIS — I890 Lymphedema, not elsewhere classified: Secondary | ICD-10-CM

## 2023-07-22 NOTE — Therapy (Signed)
  [facility] - A Dept Of [place]. [facility] [address] [place], [place], [address] Phone: [phone]   Fax:  [phone]  Occupational Therapy Screen  Patient Details  Name: Amy Bird MRN: [id number] Date of Birth: Sep 11, 1950 No data recorded  Encounter Date: 07/22/2023   OT End of Session - 07/22/23 1918     Visit Number 0          Past Medical History:  Diagnosis Date   Anemia    Arthritis    Breast cancer, left breast (HCC) 2002   Left Mastectomy, chemo + Rad tx's.   Colon cancer (HCC) 10/2012   Partial colon resection and chemo tx's.   Diverticulitis    Glaucoma    Hearing loss    Hyperlipidemia 20 years   Kidney stone    Lymphedema of left arm    Neuromuscular disorder (HCC)    neuropathy  feet and hands from chemo tx   Pneumonia    Shingles    Vertigo    last episode around Jan 2018    Past Surgical History:  Procedure Laterality Date   ABDOMINAL HYSTERECTOMY  1973   APPENDECTOMY  1969   CATARACT EXTRACTION W/PHACO Left 07/11/2015   Procedure: CATARACT EXTRACTION PHACO AND INTRAOCULAR LENS PLACEMENT (IOC) left eye;  Surgeon: Dene Etienne, MD;  Location: [facility];  Service: Ophthalmology;  Laterality: Left;   CATARACT EXTRACTION W/PHACO Right 08/27/2016   Procedure: CATARACT EXTRACTION PHACO AND INTRAOCULAR LENS PLACEMENT (IOC);  Surgeon: Etienne Dene, MD;  Location: [facility];  Service: Ophthalmology;  Laterality: Right;  IVA TOPICAL RIGHT   CESAREAN SECTION     COLON RESECTION     COLONOSCOPY  2004   COLONOSCOPY N/A 04/04/2016   Procedure: COLONOSCOPY;  Surgeon: Gladis RAYMOND Mariner, MD;  Location: [facility];  Service: Endoscopy;  Laterality: N/A;   COLONOSCOPY WITH PROPOFOL  N/A 10/01/2018   Procedure: COLONOSCOPY WITH PROPOFOL ;  Surgeon: Jinny Carmine, MD;  Location: [facility];  Service: Endoscopy;  Laterality: N/A;   COLONOSCOPY WITH PROPOFOL  N/A  09/27/2021   Procedure: COLONOSCOPY WITH PROPOFOL ;  Surgeon: Maryruth Ole DASEN, MD;  Location: [facility] END[facility];  Service: Endoscopy;  Laterality: N/A;   ESOPHAGOGASTRODUODENOSCOPY N/A 04/04/2016   Procedure: ESOPHAGOGASTRODUODENOSCOPY (EGD);  Surgeon: Gladis RAYMOND Mariner, MD;  Location: [facility] END[facility];  Service: Endoscopy;  Laterality: N/A;   kidney stone removal      MASTECTOMY Bilateral 2002,2010   salpingo oophorectmy   1973    There were no vitals filed for this visit.  OT SCREEN 12/15/22:  Patient arrived today with reports of increased swelling and tenderness over the left elbow the last 2 days. Patient with a compression sleeve in place. Patient denies having blood pressure taken, needlesticks or shots or IVs. Patient circumference increased but denies any activities that could irritate cubital tunnel or lymphedema symptoms and signs. Appear patient's has veins that it is protruded as well as tender and hot. Recommend for patient to go to walk-in clinic to rule out blood clot.  Patient to follow-up with me as needed    OT SCREEN 07/22/23:   LYMPHEDEMA/ONCOLOGY QUESTIONNAIRE - 07/22/23 0001       Right Upper Extremity Lymphedema   15 cm Proximal to Olecranon Process 30.3 cm    10 cm Proximal to Olecranon Process 29.5 cm    Olecranon Process 23 cm    15 cm Proximal to Ulnar Styloid Process 22.5  cm    10 cm Proximal to Ulnar Styloid Process 19.2 cm    Just Proximal to Ulnar Styloid Process 14.5 cm      Left Upper Extremity Lymphedema   15 cm Proximal to Olecranon Process 28.5 cm    10 cm Proximal to Olecranon Process 29.5 cm    Olecranon Process 23.5 cm    15 cm Proximal to Ulnar Styloid Process 23 cm    10 cm Proximal to Ulnar Styloid Process 20.5 cm    Just Proximal to Ulnar Styloid Process 14.2 cm      Right Lower Extremity Lymphedema   20 cm Proximal to Suprapatella 50.5 cm    At Midpatella/Popliteal Crease 39 cm    30 cm Proximal to Floor at Lateral Plantar Foot 30.5 cm       Left Lower Extremity Lymphedema   20 cm Proximal to Suprapatella 50 cm    At Midpatella/Popliteal Crease 39 cm    20 cm Proximal to Floor at Lateral Plantar Foot 33 cm           Patient arrived with reports of increased lymphedema and full feeling on left thoracic and shoulder blade, as well as under her axilla.  With increased heavy feeling in left upper extremity.  Upon measuring patient increased bilaterally.  Patient do reports she gained 15 pounds.  Since seen November. Compared to the right upper extremity left 1 increase in the forearm. Patient to present with increased lymphedema at the lateral border of scapula.  Patient seen by this OT since 2017 for left upper extremity lymphedema.  In the past patient did use a unilateral postmastectomy Jovi pack to assist with thoracic lymphedema. It appeared when patient was seen in November she had a thrombosis in the left volar elbow. Patient with increased thoracic lymphedema at this time. Recommend for patient to use for unilateral postmastectomy breast pad for 2 to 4 weeks as much as she can under light compression bra or camisole. Will reassess.  Patient also report had abdominal surgery with lymph nodes removed on the left.  Patient report heavy feeling in left lower extremity as well as increased swelling. Upon assessment patient is increased below knee compared to the right lower extremity. Recommend for patient to get 20-30 mmHg knee-high compression to wear daytime. Will reassess in 2 to 4 weeks.                          Patient will benefit from skilled therapeutic intervention in order to improve the following deficits and impairments:           Visit Diagnosis: Lymphedema of left upper extremity    Problem List Patient Active Problem List   Diagnosis Date Noted   Pelvic pain in female 07/06/2020   Pain in limb 07/06/2020   Personal history of colon cancer    Preventative health care  04/01/2018   Aortic atherosclerosis (HCC) 04/01/2018   Chronic renal disease, stage III (HCC) 04/01/2018   Advance directive discussed with patient 04/01/2018   Elevated tumor markers 11/15/2017   Thrombocytopenia (HCC) 08/27/2017   Lymphedema of left upper extremity 02/23/2017   B12 deficiency 02/10/2016   History of left breast cancer 09/17/2015   Leukopenia 09/17/2015   Absolute anemia 11/08/2014   Neutropenia (HCC) 11/08/2014   Peripheral neuropathy due to chemotherapy (HCC) 08/10/2014   Dyspepsia 08/10/2014   Allergic rhinitis 01/29/2005    Ancel Peters, OTR/L,CLT 07/22/2023, 7:21 PM  [facility] - A Dept Of [place]. [facility] [address] [place], [place], [address] Phone: [phone]   Fax:  [phone]  Name: Amy Bird MRN: [id number] Date of Birth: [birth year]

## 2023-08-19 ENCOUNTER — Inpatient Hospital Stay: Admitting: Occupational Therapy

## 2023-08-19 DIAGNOSIS — I890 Lymphedema, not elsewhere classified: Secondary | ICD-10-CM

## 2023-08-19 NOTE — Therapy (Signed)
 [facility] - A Dept Of [place]. [facility] [address] [place], [place], [address] Phone: [phone]   Fax:  [phone]  Occupational Therapy Screen  Patient Details  Name: Amy Bird MRN: [id number] Date of Birth: [birth year] No data recorded  Encounter Date: 08/19/2023   OT End of Session - 08/19/23 1623     Visit Number 0          Past Medical History:  Diagnosis Date   Anemia    Arthritis    Breast cancer, left breast (HCC) 2002   Left Mastectomy, chemo + Rad tx's.   Colon cancer (HCC) 10/2012   Partial colon resection and chemo tx's.   Diverticulitis    Glaucoma    Hearing loss    Hyperlipidemia 20 years   Kidney stone    Lymphedema of left arm    Neuromuscular disorder (HCC)    neuropathy  feet and hands from chemo tx   Pneumonia    Shingles    Vertigo    last episode around Jan 2018    Past Surgical History:  Procedure Laterality Date   ABDOMINAL HYSTERECTOMY  1973   APPENDECTOMY  1969   CATARACT EXTRACTION W/PHACO Left 07/11/2015   Procedure: CATARACT EXTRACTION PHACO AND INTRAOCULAR LENS PLACEMENT (IOC) left eye;  Surgeon: Dene Etienne, MD;  Location: [facility];  Service: Ophthalmology;  Laterality: Left;   CATARACT EXTRACTION W/PHACO Right 08/27/2016   Procedure: CATARACT EXTRACTION PHACO AND INTRAOCULAR LENS PLACEMENT (IOC);  Surgeon: Etienne Dene, MD;  Location: [facility];  Service: Ophthalmology;  Laterality: Right;  IVA TOPICAL RIGHT   CESAREAN SECTION     COLON RESECTION     COLONOSCOPY  2004   COLONOSCOPY N/A 04/04/2016   Procedure: COLONOSCOPY;  Surgeon: Gladis RAYMOND Mariner, MD;  Location: [facility] END[facility]COPY;  Service: Endoscopy;  Laterality: N/A;   COLONOSCOPY WITH PROPOFOL  N/A 10/01/2018   Procedure: COLONOSCOPY WITH PROPOFOL ;  Surgeon: Jinny Carmine, MD;  Location: [facility];  Service: Endoscopy;  Laterality: N/A;   COLONOSCOPY WITH PROPOFOL  N/A  09/27/2021   Procedure: COLONOSCOPY WITH PROPOFOL ;  Surgeon: Maryruth Ole DASEN, MD;  Location: [facility] ENDOSCOPY;  Service: Endoscopy;  Laterality: N/A;   ESOPHAGOGASTRODUODENOSCOPY N/A 04/04/2016   Procedure: ESOPHAGOGASTRODUODENOSCOPY (EGD);  Surgeon: Gladis RAYMOND Mariner, MD;  Location: [facility] ENDOSCOPY;  Service: Endoscopy;  Laterality: N/A;   kidney stone removal      MASTECTOMY Bilateral 2002,2010   salpingo oophorectmy   1973    There were no vitals filed for this visit.   OT SCREEN 07/22/23:     LYMPHEDEMA/ONCOLOGY QUESTIONNAIRE - 07/22/23 0001                Right Upper Extremity Lymphedema    15 cm Proximal to Olecranon Process 30.3 cm     10 cm Proximal to Olecranon Process 29.5 cm     Olecranon Process 23 cm     15 cm Proximal to Ulnar Styloid Process 22.5 cm     10 cm Proximal to Ulnar Styloid Process 19.2 cm     Just Proximal to Ulnar Styloid Process 14.5 cm          Left Upper Extremity Lymphedema    15 cm Proximal to Olecranon Process 28.5 cm     10 cm Proximal to Olecranon Process 29.5 cm     Olecranon Process 23.5 cm     15 cm Proximal to Ulnar  Styloid Process 23 cm     10 cm Proximal to Ulnar Styloid Process 20.5 cm     Just Proximal to Ulnar Styloid Process 14.2 cm          Right Lower Extremity Lymphedema    20 cm Proximal to Suprapatella 50.5 cm     At Midpatella/Popliteal Crease 39 cm     30 cm Proximal to Floor at Lateral Plantar Foot 30.5 cm          Left Lower Extremity Lymphedema    20 cm Proximal to Suprapatella 50 cm     At Midpatella/Popliteal Crease 39 cm     20 cm Proximal to Floor at Lateral Plantar Foot 33 cm                Patient arrived with reports of increased lymphedema and full feeling on left thoracic and shoulder blade, as well as under her axilla.  With increased heavy feeling in left upper extremity.  Upon measuring patient increased bilaterally.  Patient do reports she gained 15 pounds.  Since seen November. Compared to the right  upper extremity left 1 increase in the forearm. Patient to present with increased lymphedema at the lateral border of scapula.   Patient seen by this OT since 2017 for left upper extremity lymphedema.  In the past patient did use a unilateral postmastectomy Jovi pack to assist with thoracic lymphedema. It appeared when patient was seen in November she had a thrombosis in the left volar elbow. Patient with increased thoracic lymphedema at this time. Recommend for patient to use for unilateral postmastectomy breast pad for 2 to 4 weeks as much as she can under light compression bra or camisole. Will reassess.   Patient also report had abdominal surgery with lymph nodes removed on the left.  Patient report heavy feeling in left lower extremity as well as increased swelling. Upon assessment patient is increased below knee compared to the right lower extremity. Recommend for patient to get 20-30 mmHg knee-high compression to wear daytime. Will reassess in 2 to 4 weeks.    08/19/23 OT SCREEN: Patient following up after wearing Jovi pack breast pad on left thoracic 5 times a week for about 4 to 6 weeks.  Report decreased feeling of fullness heaviness and swelling on left thoracic. Patient report having only 1 upper extremity sleeve switching out with her old one. Recommend for patient to contact [facility] to get a second sleeve.  For left upper extremity. Patient arrive with knee-high compression sleeve on the left lower extremity.  Has been wearing it for about 4 weeks. Measurements decreased.  Recommend for patient to wear daily. Reviewed with patient again wearing off Jovi pack correctly to provide compression.  Patient reports has a compression bra.  Patient can continue with wearing compression contact me as needed.  For follow-up   LYMPHEDEMA/ONCOLOGY QUESTIONNAIRE - 08/19/23 0001       Left Upper Extremity Lymphedema   15 cm Proximal to Olecranon Process 29 cm    10 cm Proximal to  Olecranon Process 29.3 cm    Olecranon Process 23.5 cm    15 cm Proximal to Ulnar Styloid Process 23 cm    10 cm Proximal to Ulnar Styloid Process 20.1 cm    Just Proximal to Ulnar Styloid Process 14 cm      Left Lower Extremity Lymphedema   20 cm Proximal to Floor at Lateral Plantar Foot 32 cm  Visit Diagnosis: Lymphedema of left upper extremity    Problem List Patient Active Problem List   Diagnosis Date Noted   Pelvic pain in female 07/06/2020   Pain in limb 07/06/2020   Personal history of colon cancer    Preventative health care 04/01/2018   Aortic atherosclerosis (HCC) 04/01/2018   Chronic renal disease, stage III (HCC) 04/01/2018   Advance directive discussed with patient 04/01/2018   Elevated tumor markers 11/15/2017   Thrombocytopenia (HCC) 08/27/2017   Lymphedema of left upper extremity 02/23/2017   B12 deficiency 02/10/2016   History of left breast cancer 09/17/2015   Leukopenia 09/17/2015   Absolute anemia 11/08/2014   Neutropenia (HCC) 11/08/2014   Peripheral neuropathy due to chemotherapy (HCC) 08/10/2014   Dyspepsia 08/10/2014   Allergic rhinitis 01/29/2005    Ancel Peters, OTR/L,CLT 08/19/2023, 4:48 PM  [facility] - A Dept Of [place]. [facility] [address] [place], [place], [address] Phone: [phone]   Fax:  [phone]  Name: Amy Bird MRN: [id number] Date of Birth: 1950/03/15

## 2023-11-23 ENCOUNTER — Inpatient Hospital Stay (HOSPITAL_BASED_OUTPATIENT_CLINIC_OR_DEPARTMENT_OTHER): Admitting: Oncology

## 2023-11-23 ENCOUNTER — Inpatient Hospital Stay: Attending: Oncology

## 2023-11-23 ENCOUNTER — Encounter: Payer: Self-pay | Admitting: Oncology

## 2023-11-23 VITALS — BP 111/56 | HR 80 | Temp 97.4°F | Resp 18 | Wt 129.9 lb

## 2023-11-23 DIAGNOSIS — Z85038 Personal history of other malignant neoplasm of large intestine: Secondary | ICD-10-CM

## 2023-11-23 DIAGNOSIS — Z853 Personal history of malignant neoplasm of breast: Secondary | ICD-10-CM | POA: Insufficient documentation

## 2023-11-23 DIAGNOSIS — T451X5A Adverse effect of antineoplastic and immunosuppressive drugs, initial encounter: Secondary | ICD-10-CM | POA: Diagnosis not present

## 2023-11-23 DIAGNOSIS — E538 Deficiency of other specified B group vitamins: Secondary | ICD-10-CM

## 2023-11-23 DIAGNOSIS — Z08 Encounter for follow-up examination after completed treatment for malignant neoplasm: Secondary | ICD-10-CM | POA: Diagnosis present

## 2023-11-23 DIAGNOSIS — D696 Thrombocytopenia, unspecified: Secondary | ICD-10-CM

## 2023-11-23 DIAGNOSIS — Z803 Family history of malignant neoplasm of breast: Secondary | ICD-10-CM | POA: Diagnosis not present

## 2023-11-23 DIAGNOSIS — Z87891 Personal history of nicotine dependence: Secondary | ICD-10-CM | POA: Insufficient documentation

## 2023-11-23 DIAGNOSIS — G620 Drug-induced polyneuropathy: Secondary | ICD-10-CM | POA: Diagnosis not present

## 2023-11-23 DIAGNOSIS — Z9013 Acquired absence of bilateral breasts and nipples: Secondary | ICD-10-CM

## 2023-11-23 DIAGNOSIS — I890 Lymphedema, not elsewhere classified: Secondary | ICD-10-CM | POA: Insufficient documentation

## 2023-11-23 DIAGNOSIS — G609 Hereditary and idiopathic neuropathy, unspecified: Secondary | ICD-10-CM

## 2023-11-23 LAB — CMP (CANCER CENTER ONLY)
ALT: 15 U/L (ref 0–44)
AST: 25 U/L (ref 15–41)
Albumin: 3.6 g/dL (ref 3.5–5.0)
Alkaline Phosphatase: 92 U/L (ref 38–126)
Anion gap: 8 (ref 5–15)
BUN: 19 mg/dL (ref 8–23)
CO2: 29 mmol/L (ref 22–32)
Calcium: 9.1 mg/dL (ref 8.9–10.3)
Chloride: 98 mmol/L (ref 98–111)
Creatinine: 0.83 mg/dL (ref 0.44–1.00)
GFR, Estimated: >60 mL/min (ref >60)
Glucose, Bld: 92 mg/dL (ref 70–99)
Potassium: 3.9 mmol/L (ref 3.5–5.1)
Sodium: 135 mmol/L (ref 135–145)
Total Bilirubin: 0.8 mg/dL (ref 0.0–1.2)
Total Protein: 7.4 g/dL (ref 6.5–8.1)

## 2023-11-23 LAB — CBC WITH DIFFERENTIAL (CANCER CENTER ONLY)
Abs Immature Granulocytes: 0.01 K/uL (ref 0.00–0.07)
Basophils Absolute: 0 K/uL (ref 0.0–0.1)
Basophils Relative: 1 %
Eosinophils Absolute: 0 K/uL (ref 0.0–0.5)
Eosinophils Relative: 0 %
HCT: 39.3 % (ref 36.0–46.0)
Hemoglobin: 13 g/dL (ref 12.0–15.0)
Immature Granulocytes: 0 %
Lymphocytes Relative: 34 %
Lymphs Abs: 1.1 K/uL (ref 0.7–4.0)
MCH: 31.9 pg (ref 26.0–34.0)
MCHC: 33.1 g/dL (ref 30.0–36.0)
MCV: 96.6 fL (ref 80.0–100.0)
Monocytes Absolute: 0.3 K/uL (ref 0.1–1.0)
Monocytes Relative: 8 %
Neutro Abs: 1.8 K/uL (ref 1.7–7.7)
Neutrophils Relative %: 57 %
Platelet Count: 154 K/uL (ref 150–400)
RBC: 4.07 MIL/uL (ref 3.87–5.11)
RDW: 12.2 % (ref 11.5–15.5)
WBC Count: 3.2 K/uL — ABNORMAL LOW (ref 4.0–10.5)
nRBC: 0 % (ref 0.0–0.2)

## 2023-11-23 LAB — VITAMIN B12: Vitamin B-12: 293 pg/mL (ref 180–914)

## 2023-11-23 NOTE — Assessment & Plan Note (Addendum)
 Suggest patient to try medication to help with her symptoms.  She declined Referral to acupuncture clinic. Refer to neurology  Her symptoms also indicate possible Raynaud's phenomenon.  Recommend patient to avoid cold temperature exposure. Patient requested a Parking permit due to neuropathy on her feet.

## 2023-11-23 NOTE — Progress Notes (Signed)
 Hematology/Oncology Consult Note [phone] [phone]   Clinic day:  11/23/2023  Chief Complaint: Follow-up for breast cancer and colon cancer, neuropathy  ASSESSMENT & PLAN:  Amy Bird is a 73 y.o.  female with a history of left breast cancer (2002), right sided stage III colon cancer (2014), and mild pancytopenia.  History of left breast cancer #History of left breast cancer status post mastectomy bilaterally, adjuvant chemotherapy Clinically she is doing well.  Previous breast pathology was not available.  She did not receive adjuvant endocrine therapy. Clinically she is doing well.  Physical examination is unremarkable.   Lymphedema of left upper extremity Continue to wear sleeve.  Follow-up with lymphedema clinic if needed.  History of colon cancer History of ascending colon cancer, stage III,   Status post hemicolectomy -12/24/2012 and adjuvant chemotherapy (01/28/2013 - 04/29/2013). She has chronically elevated CEA.  Clinically doing well.  Recommend her to continue her colonoscopy screening at frequency recommended by her gastroenterologist.   Peripheral neuropathy due to chemotherapy Suggest patient to try medication to help with her symptoms.  She declined Referral to acupuncture clinic. Refer to neurology  Her symptoms also indicate possible Raynaud's phenomenon.  Recommend patient to avoid cold temperature exposure. Patient requested a Parking permit due to neuropathy on her feet.   Orders Placed This Encounter  Procedures   CBC with Differential (Cancer Center Only)    Standing Status:   Future    Number of Occurrences:   1    Expected Date:   11/23/2023    Expiration Date:   02/21/2024   CMP (Cancer Center only)    Standing Status:   Future    Number of Occurrences:   1    Expected Date:   11/23/2023    Expiration Date:   02/21/2024   CEA    Standing Status:   Future    Number of Occurrences:   1    Expected Date:   11/23/2023     Expiration Date:   02/21/2024   Vitamin B12    Standing Status:   Future    Number of Occurrences:   1    Expected Date:   11/23/2023    Expiration Date:   02/21/2024   CBC with Differential (Cancer Center Only)    Standing Status:   Future    Expected Date:   11/22/2024    Expiration Date:   02/20/2025   CMP (Cancer Center only)    Standing Status:   Future    Expected Date:   11/22/2024    Expiration Date:   02/20/2025   CEA    Standing Status:   Future    Expected Date:   11/22/2024    Expiration Date:   02/20/2025   Vitamin B12    Standing Status:   Future    Expected Date:   11/22/2024    Expiration Date:   02/20/2025   Ambulatory referral to Neurology    Referral Priority:   Routine    Referral Type:   Consultation    Referral Reason:   Specialty Services Required    Requested Specialty:   Neurology    Number of Visits Requested:   1   Follow-up as needed All questions were answered. The patient knows to call the clinic with any problems, questions or concerns.  Zelphia Cap, MD, PhD [facility] Hematology Oncology 11/23/2023   HPI  Amy Bird is a 73 y.o. female with a history of left  breast cancer (2002), right sided colon cancer (2014), mild pancytopenia who is seen for 6 month assessment. Patient previously followed up with Dr. Rudell and switched care to me on 01/28/2019. Extensive previous medical records reviewed was performed by me  #Breast cancer Left breast cancer 2002, per note, underwent left mastectomy in March 2002. Pathology was not available. She received Adriamycin and Cytoxan [completed 07/2000/], followed by Herceptin and Taxotere in 08/2000 Patient did not receive any adjuvant hormone therapy.  She underwent a right-sided mastectomy in 02/2008  CA 27-29 has been followed was 40.9 on 06/06/2010, 42.6 on 02/04/2011, 35.3 on 03/31/2012, 38.1 on 11/09/2012, 52.3 on 09/17/2015, 50.5 on 10/01/2015, 47.8 on 02/11/2016, 36.1 on 06/20/2016, 36.6 on 10/20/2016, 42.6 on  02/23/2017, 44.6 on 03/26/2017, 37.7 on 05/22/2017, 47.1 on 08/25/2017, 45.7 on 12/28/2017, and 52.2 on 06/28/2018.  #Colon cancer, Diagnosed with colon cancer in 12/2012, underwent right hemicolectomy on 12/24/2012.  Pathology reviewed grade 2 Adenocarcinoma which extended through the muscularis propria and into the adjacent adipose.1/27 lymph nodes were positive.  Pathological stage [id number] Patient was treated with CALGB protocol with FOLFOX chemotherapy +/- celecoxib.  She withdrew from the study in 02/25/2013.  She described trouble with counts.  She received 6 cycles of FOLFOX (01/28/2013 - 04/29/2013).   Colonoscopy on 04/04/2016 revealed a 2 mm polyp in the distal sigmoid colon.  Pathology revealed a serrated polyp.  EGD on 04/04/2016 revealed LA grade A esophagitis, non-bleeding erosive gastropathy, gastritis, and duodenitis.  Gastric and GEJ pathology revealed moderate chronic active inflammation, H pylori associated.   CEA has been followed was 3.8 on 12/09/2012, 6.3 on 01/27/2013, 10.6 on 05/26/2013, 5.9 on 09/22/2013, 7.1 on 05/10/2014, 7.0 on 11/08/2014, 5.8 on 05/08/2015, 7.2 on 09/17/2015, 5.9 on 10/01/2015, 6.5 on 02/11/2016, 6.9 on 10/20/2016, 7.6 on 02/23/2017, 7.3 on 08/25/2017, 6.7 on 12/28/2017, and 7.7 on 06/28/2018.   PET scan on 10/09/2015 revealed no evidence of recurrent disease.  Chest, abdomen, and pelvic CT scan on 02/15/2016 revealed no findings of recurrent malignancy.  There were radiation therapy related opacities in the left lung.  There was prominent stool throughout the colon.  There was lumbar degenerative disc disease.  PET scan on 09/11/2017 revealed no hypermetabolic residual or recurrent disease.  #Chronic history of pancytopenia since 10/2012. Bone marrow aspiration and biopsy in 10/2012 was negative for myelodysplastic syndrome.  #Weight loss, since discontinuation of antidepressants years ago.  Thyroid  function has been checked which was normal. Labs all  02/26/2016 reviewed normal cortisol, sed rate, ANA.  HIV negative.    Left lower extremity duplex on 09/19/2015 revealed no evidence of DVT.  She had a left popliteal fossa Baker's cyst.  She has been diagnosed with H pylori (stool antigen + 05/17/2016) and treated x 3.   She has B12 deficiency.  B12 was 251 on 08/27/2017 and 760 on 09/24/2017.  Folate was 12.  She began oral B12 on 08/28/2017.  She has been off B12 x 1 month.  #Patient reports having bilateral lower extremity neuropathy.  She is not currently on any thing for neuropathy. Also she has not followed up with lymphedema clinic for a while.  Denies unintentional weight loss, night sweating, fever or chills. Clinical she is doing well.  Patient reports that occasionally she has small amount of blood in her stool,attributing it to irritation from amoxicillin , which causes diarrhea and minor bleeding when wiping. Last colonoscopy was on 09/27/2021.  She experiences ongoing neuropathy, particularly affecting her hands and feet, with significant discomfort  and cold-induced discoloration. Her hands turn blue when exposed to cold, and her feet are described as 'really, really bad,' requiring coverage at night to manage the cold. She has not been on any medication for neuropathy due to a history of poor tolerance to medications.    Past Medical History:  Diagnosis Date   Anemia    Arthritis    Breast cancer, left breast (HCC) 2002   Left Mastectomy, chemo + Rad tx's.   Colon cancer (HCC) 10/2012   Partial colon resection and chemo tx's.   Diverticulitis    Glaucoma    Hearing loss    Hyperlipidemia 20 years   Kidney stone    Lymphedema of left arm    Neuromuscular disorder (HCC)    neuropathy  feet and hands from chemo tx   Pneumonia    Shingles    Vertigo    last episode around Jan 2018    Past Surgical History:  Procedure Laterality Date   ABDOMINAL HYSTERECTOMY  1973   APPENDECTOMY  1969   CATARACT EXTRACTION W/PHACO Left  07/11/2015   Procedure: CATARACT EXTRACTION PHACO AND INTRAOCULAR LENS PLACEMENT (IOC) left eye;  Surgeon: Dene Etienne, MD;  Location: [facility];  Service: Ophthalmology;  Laterality: Left;   CATARACT EXTRACTION W/PHACO Right 08/27/2016   Procedure: CATARACT EXTRACTION PHACO AND INTRAOCULAR LENS PLACEMENT (IOC);  Surgeon: Etienne Dene, MD;  Location: [facility];  Service: Ophthalmology;  Laterality: Right;  IVA TOPICAL RIGHT   CESAREAN SECTION     COLON RESECTION     COLONOSCOPY  2004   COLONOSCOPY N/A 04/04/2016   Procedure: COLONOSCOPY;  Surgeon: Gladis RAYMOND Mariner, MD;  Location: [facility];  Service: Endoscopy;  Laterality: N/A;   COLONOSCOPY WITH PROPOFOL  N/A 10/01/2018   Procedure: COLONOSCOPY WITH PROPOFOL ;  Surgeon: Jinny Carmine, MD;  Location: [facility];  Service: Endoscopy;  Laterality: N/A;   COLONOSCOPY WITH PROPOFOL  N/A 09/27/2021   Procedure: COLONOSCOPY WITH PROPOFOL ;  Surgeon: Maryruth Ole DASEN, MD;  Location: [facility];  Service: Endoscopy;  Laterality: N/A;   ESOPHAGOGASTRODUODENOSCOPY N/A 04/04/2016   Procedure: ESOPHAGOGASTRODUODENOSCOPY (EGD);  Surgeon: Gladis RAYMOND Mariner, MD;  Location: [facility] ENDOSCOPY;  Service: Endoscopy;  Laterality: N/A;   kidney stone removal      MASTECTOMY Bilateral 2002,2010   salpingo oophorectmy   1973    Family History  Problem Relation Age of Onset   Hypertension Mother    Alzheimer's disease Mother    Cancer Father        prostate   Prostate cancer Father    Cancer Sister        breast   Hypertension Sister    Cancer Brother        stomach   Hypertension Brother    Sarcoidosis Other        positive family history.   Cancer Sister        breast   Cancer Sister        breast    Social History:  reports that she quit smoking about 23 years ago. Her smoking use included cigarettes. She started smoking about 58 years ago. She has a 52.5 pack-year smoking history. She has never used  smokeless tobacco. She reports that she does not drink alcohol  and does not use drugs.  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  She quit smoking in 2003.  She lives in [place].  She belongs to the [employer].  The patient is alone today.  Allergies:  Allergies  Allergen Reactions   Hydrocodone-Acetaminophen  Other (See Comments) and Shortness Of Breath    Not witnessed info gathered as part of patient screening.  PT states her chest felt very heavy and difficulty breathing.   Sulfa Antibiotics Swelling   Cefuroxime  Diarrhea   Hydrocodone-Ibuprofen Other (See Comments)    Patient feels like there is a heavy wight on her chest   Levofloxacin Hives   Statins Other (See Comments)    Leg cramps and groin pain   Other Itching    Tree Nuts   Tape Rash   Tree Extract Itching    Current Medications: Current Outpatient Medications  Medication Sig Dispense Refill   Calcium Carb-Cholecalciferol (CALCIUM 600 + D PO) Take 1 tablet by mouth daily.      timolol  (TIMOPTIC ) 0.5 % ophthalmic solution Apply to eye.     No current facility-administered medications for this visit.    Review of Systems  Constitutional:  Negative for appetite change, chills, fatigue and fever.  HENT:   Negative for hearing loss and voice change.   Eyes:  Negative for eye problems.  Respiratory:  Negative for chest tightness and cough.   Cardiovascular:  Negative for chest pain.  Gastrointestinal:  Negative for abdominal distention, abdominal pain and blood in stool.  Endocrine: Negative for hot flashes.  Genitourinary:  Negative for difficulty urinating and frequency.   Musculoskeletal:  Negative for arthralgias.  Skin:  Negative for itching and rash.  Neurological:  Positive for numbness. Negative for extremity weakness.  Hematological:  Negative for adenopathy.  Psychiatric/Behavioral:  Negative for confusion.       Physical Exam:  Blood pressure (!) 111/56, pulse 80, temperature (!) 97.4 F  (36.3 C), temperature source Tympanic, resp. rate 18, weight 129 lb 14.4 oz (58.9 kg), SpO2 100%. Physical Exam Constitutional:      General: She is not in acute distress.    Comments: Thin built, she walks independently  HENT:     Head: Normocephalic and atraumatic.     Nose: Nose normal.     Mouth/Throat:     Pharynx: No oropharyngeal exudate.  Eyes:     General: No scleral icterus.    Pupils: Pupils are equal, round, and reactive to light.  Cardiovascular:     Rate and Rhythm: Normal rate and regular rhythm.     Heart sounds: No murmur heard. Pulmonary:     Effort: Pulmonary effort is normal. No respiratory distress.     Breath sounds: No rales.  Chest:     Chest wall: No tenderness.  Abdominal:     General: There is no distension.     Palpations: Abdomen is soft.     Tenderness: There is no abdominal tenderness.  Musculoskeletal:        General: Normal range of motion.     Cervical back: Normal range of motion and neck supple.     Comments: Left upper arm-lymphedema sleeve  Skin:    General: Skin is warm and dry.     Findings: No erythema.  Neurological:     Mental Status: She is alert and oriented to person, place, and time.  Psychiatric:        Mood and Affect: Affect normal.    Status post bilateral mastectomy, no chest wall abnormality was palpable.  No palpable axillary lymph node adenopathy bilaterally.  Chronic left axillary scarring tissue.  Labs reviewed    Latest Ref Rng & Units 11/23/2023    2:33  PM 12/15/2022    2:48 PM 08/01/2020    3:47 PM  CBC  WBC 4.0 - 10.5 K/uL 3.2  3.4  2.9   Hemoglobin 12.0 - 15.0 g/dL 86.9  87.2  86.7   Hematocrit 36.0 - 46.0 % 39.3  37.9  39.4   Platelets 150 - 400 K/uL 154  147  148       Latest Ref Rng & Units 11/23/2023    2:33 PM 12/15/2022    2:48 PM 01/03/2022   12:53 PM  CMP  Glucose 70 - 99 mg/dL 92  92    BUN 8 - 23 mg/dL 19  14    Creatinine 9.55 - 1.00 mg/dL 9.16  9.14  9.09   Sodium 135 - 145 mmol/L 135   137    Potassium 3.5 - 5.1 mmol/L 3.9  3.9    Chloride 98 - 111 mmol/L 98  101    CO2 22 - 32 mmol/L 29  29    Calcium 8.9 - 10.3 mg/dL 9.1  8.8    Total Protein 6.5 - 8.1 g/dL 7.4     Total Bilirubin 0.0 - 1.2 mg/dL 0.8     Alkaline Phos 38 - 126 U/L 92     AST 15 - 41 U/L 25     ALT 0 - 44 U/L 15      RADIOGRAPHIC STUDIES: I have personally reviewed the radiological images as listed and agreed with the findings in the report. No results found.

## 2023-11-23 NOTE — Assessment & Plan Note (Signed)
#  History of left breast cancer status post mastectomy bilaterally, adjuvant chemotherapy Clinically she is doing well.  Previous breast pathology was not available.  She did not receive adjuvant endocrine therapy. Clinically she is doing well.  Physical examination is unremarkable.

## 2023-11-23 NOTE — Assessment & Plan Note (Signed)
 Continue to wear sleeve.  Follow-up with lymphedema clinic if needed.

## 2023-11-23 NOTE — Assessment & Plan Note (Signed)
 History of ascending colon cancer, stage III,   Status post hemicolectomy -12/24/2012 and adjuvant chemotherapy (01/28/2013 - 04/29/2013). She has chronically elevated CEA.  Clinically doing well.  Recommend her to continue her colonoscopy screening at frequency recommended by her gastroenterologist.

## 2023-11-24 ENCOUNTER — Telehealth: Payer: Self-pay

## 2023-11-24 LAB — CEA: CEA: 9.9 ng/mL — ABNORMAL HIGH (ref 0.0–4.7)

## 2023-11-24 NOTE — Telephone Encounter (Addendum)
 Neurology referral faxed to [facility] neurology. Re Neuropathy. Fax confirmation received.

## 2023-12-03 NOTE — Telephone Encounter (Signed)
 Called [facility] neurology and confirmed receipts of referral. They have attempted to contact pt several times.

## 2023-12-04 ENCOUNTER — Telehealth: Payer: Self-pay

## 2023-12-04 ENCOUNTER — Encounter: Payer: Self-pay | Admitting: Oncology

## 2023-12-04 DIAGNOSIS — R970 Elevated carcinoembryonic antigen [CEA]: Secondary | ICD-10-CM

## 2023-12-04 DIAGNOSIS — Z85038 Personal history of other malignant neoplasm of large intestine: Secondary | ICD-10-CM

## 2023-12-04 NOTE — Telephone Encounter (Signed)
 Patient called with concerns of abnormal WBC and CEA drawn on 11/23/2023.  She has reviewed these results on MyChart and would like Dr. Babara to review and comment on results.

## 2023-12-04 NOTE — Telephone Encounter (Signed)
 Dr. Babara said she will contact pt.

## 2023-12-04 NOTE — Telephone Encounter (Signed)
 Rosina, please schedule as MD advise and inform pt of appt details.

## 2023-12-07 ENCOUNTER — Telehealth: Payer: Self-pay | Admitting: *Deleted

## 2023-12-07 NOTE — Telephone Encounter (Signed)
 Acupuncture approval faxed to [facility].  They will contact Ms. Kobrin to set up the appt.

## 2023-12-08 NOTE — Telephone Encounter (Signed)
 CT Scheduled on 11/25

## 2023-12-15 ENCOUNTER — Ambulatory Visit
Admission: RE | Admit: 2023-12-15 | Discharge: 2023-12-15 | Disposition: A | Discharge status: Home / Self Care | Source: Ambulatory Visit | Attending: Oncology | Admitting: Oncology

## 2023-12-15 DIAGNOSIS — Z85038 Personal history of other malignant neoplasm of large intestine: Secondary | ICD-10-CM | POA: Insufficient documentation

## 2023-12-15 DIAGNOSIS — R970 Elevated carcinoembryonic antigen [CEA]: Secondary | ICD-10-CM | POA: Diagnosis present

## 2023-12-15 MED ORDER — BARIUM SULFATE 2 % PO SUSP
450.0000 mL | Freq: Once | ORAL | Status: AC
  Administered 2023-12-15: 1 mL via ORAL

## 2023-12-15 MED ORDER — BARIUM SULFATE 2 % PO SUSP
450.0000 mL | Freq: Once | ORAL | Status: AC
  Administered 2023-12-15: 450 mL via ORAL

## 2023-12-15 MED ORDER — IOHEXOL 300 MG/ML  SOLN
75.0000 mL | Freq: Once | INTRAMUSCULAR | Status: AC | PRN
  Administered 2023-12-15: 75 mL via INTRAVENOUS

## 2023-12-24 ENCOUNTER — Inpatient Hospital Stay: Attending: Oncology | Admitting: Oncology

## 2023-12-24 ENCOUNTER — Encounter: Payer: Self-pay | Admitting: Oncology

## 2023-12-24 VITALS — BP 112/59 | HR 83 | Temp 98.6°F | Resp 20 | Wt 129.8 lb

## 2023-12-24 DIAGNOSIS — G620 Drug-induced polyneuropathy: Secondary | ICD-10-CM

## 2023-12-24 DIAGNOSIS — Z9013 Acquired absence of bilateral breasts and nipples: Secondary | ICD-10-CM | POA: Diagnosis not present

## 2023-12-24 DIAGNOSIS — Z853 Personal history of malignant neoplasm of breast: Secondary | ICD-10-CM | POA: Diagnosis not present

## 2023-12-24 DIAGNOSIS — E538 Deficiency of other specified B group vitamins: Secondary | ICD-10-CM | POA: Insufficient documentation

## 2023-12-24 DIAGNOSIS — Z08 Encounter for follow-up examination after completed treatment for malignant neoplasm: Secondary | ICD-10-CM | POA: Diagnosis present

## 2023-12-24 DIAGNOSIS — T451X5A Adverse effect of antineoplastic and immunosuppressive drugs, initial encounter: Secondary | ICD-10-CM

## 2023-12-24 DIAGNOSIS — Z87891 Personal history of nicotine dependence: Secondary | ICD-10-CM | POA: Diagnosis not present

## 2023-12-24 DIAGNOSIS — Z85038 Personal history of other malignant neoplasm of large intestine: Secondary | ICD-10-CM | POA: Diagnosis present

## 2023-12-24 NOTE — Assessment & Plan Note (Signed)
 Suggest patient to try medication to help with her symptoms.  She declined Referral to acupuncture clinic. Refer to neurology  Her symptoms also indicate possible Raynaud's phenomenon.  Recommend patient to avoid cold temperature exposure.

## 2023-12-24 NOTE — Progress Notes (Signed)
 Hematology/Oncology Consult Note [phone] [phone]   Clinic day:  12/24/2023  Chief Complaint: Follow-up for breast cancer and colon cancer, neuropathy  ASSESSMENT & PLAN:  Amy Bird is a 73 y.o.  female with a history of left breast cancer (2002), right sided stage III colon cancer (2014), and mild pancytopenia.  Peripheral neuropathy due to chemotherapy Suggest patient to try medication to help with her symptoms.  She declined Referral to acupuncture clinic. Refer to neurology  Her symptoms also indicate possible Raynaud's phenomenon.  Recommend patient to avoid cold temperature exposure.   History of left breast cancer #History of left breast cancer status post mastectomy bilaterally, adjuvant chemotherapy Clinically she is doing well.  Previous breast pathology was not available.  She did not receive adjuvant endocrine therapy. Clinically she is doing well.   History of colon cancer History of ascending colon cancer, stage III,   Status post hemicolectomy -12/24/2012 and adjuvant chemotherapy (01/28/2013 - 04/29/2013). She has chronically elevated CEA.  Clinically doing well. Most recent CEA level is slightly increased comparing to her baseline. CT scan showed no evidence of recurrence. Reviewed with patient.  Recommend her to continue her colonoscopy screening at frequency recommended by her gastroenterologist.    No orders of the defined types were placed in this encounter.  Follow-up as needed All questions were answered. The patient knows to call the clinic with any problems, questions or concerns.  Zelphia Cap, MD, PhD [facility] Hematology Oncology 12/24/2023   HPI  Amy Bird is a 73 y.o. female with a history of left breast cancer (2002), right sided colon cancer (2014), mild pancytopenia who is seen for 6 month assessment. Patient previously followed up with Dr. Rudell and switched care to me on 01/28/2019. Extensive previous medical  records reviewed was performed by me  #Breast cancer Left breast cancer 2002, per note, underwent left mastectomy in March 2002. Pathology was not available. She received Adriamycin and Cytoxan [completed 07/2000/], followed by Herceptin and Taxotere in 08/2000 Patient did not receive any adjuvant hormone therapy.  She underwent a right-sided mastectomy in 02/2008  CA 27-29 has been followed was 40.9 on 06/06/2010, 42.6 on 02/04/2011, 35.3 on 03/31/2012, 38.1 on 11/09/2012, 52.3 on 09/17/2015, 50.5 on 10/01/2015, 47.8 on 02/11/2016, 36.1 on 06/20/2016, 36.6 on 10/20/2016, 42.6 on 02/23/2017, 44.6 on 03/26/2017, 37.7 on 05/22/2017, 47.1 on 08/25/2017, 45.7 on 12/28/2017, and 52.2 on 06/28/2018.  #Colon cancer, Diagnosed with colon cancer in 12/2012, underwent right hemicolectomy on 12/24/2012.  Pathology reviewed grade 2 Adenocarcinoma which extended through the muscularis propria and into the adjacent adipose.1/27 lymph nodes were positive.  Pathological stage [id number] Patient was treated with CALGB protocol with FOLFOX chemotherapy +/- celecoxib.  She withdrew from the study in 02/25/2013.  She described trouble with counts.  She received 6 cycles of FOLFOX (01/28/2013 - 04/29/2013).   Colonoscopy on 04/04/2016 revealed a 2 mm polyp in the distal sigmoid colon.  Pathology revealed a serrated polyp.  EGD on 04/04/2016 revealed LA grade A esophagitis, non-bleeding erosive gastropathy, gastritis, and duodenitis.  Gastric and GEJ pathology revealed moderate chronic active inflammation, H pylori associated.   CEA has been followed was 3.8 on 12/09/2012, 6.3 on 01/27/2013, 10.6 on 05/26/2013, 5.9 on 09/22/2013, 7.1 on 05/10/2014, 7.0 on 11/08/2014, 5.8 on 05/08/2015, 7.2 on 09/17/2015, 5.9 on 10/01/2015, 6.5 on 02/11/2016, 6.9 on 10/20/2016, 7.6 on 02/23/2017, 7.3 on 08/25/2017, 6.7 on 12/28/2017, and 7.7 on 06/28/2018.   PET scan on 10/09/2015 revealed no evidence  of recurrent disease.  Chest,  abdomen, and pelvic CT scan on 02/15/2016 revealed no findings of recurrent malignancy.  There were radiation therapy related opacities in the left lung.  There was prominent stool throughout the colon.  There was lumbar degenerative disc disease.  PET scan on 09/11/2017 revealed no hypermetabolic residual or recurrent disease.  #Chronic history of pancytopenia since 10/2012. Bone marrow aspiration and biopsy in 10/2012 was negative for myelodysplastic syndrome.  #Weight loss, since discontinuation of antidepressants years ago.  Thyroid  function has been checked which was normal. Labs all 02/26/2016 reviewed normal cortisol, sed rate, ANA.  HIV negative.    Left lower extremity duplex on 09/19/2015 revealed no evidence of DVT.  She had a left popliteal fossa Baker's cyst.  She has been diagnosed with H pylori (stool antigen + 05/17/2016) and treated x 3.   She has B12 deficiency.  B12 was 251 on 08/27/2017 and 760 on 09/24/2017.  Folate was 12.  She began oral B12 on 08/28/2017.  She has been off B12 x 1 month.  #Patient reports having bilateral lower extremity neuropathy.  She is not currently on any thing for neuropathy. Also she has not followed up with lymphedema clinic for a while.  Denies unintentional weight loss, night sweating, fever or chills. Clinical she is doing well.  Patient reports that occasionally she has small amount of blood in her stool,attributing it to irritation from amoxicillin , which causes diarrhea and minor bleeding when wiping. Last colonoscopy was on 09/27/2021.  She experiences ongoing neuropathy, particularly affecting her hands and feet, with significant discomfort and cold-induced discoloration. Her hands turn blue when exposed to cold, and her feet are described as 'really, really bad,' requiring coverage at night to manage the cold. She has not been on any medication for neuropathy due to a history of poor tolerance to medications.  Her CEA was slightly elevated  than previous baseline. CT scan was obtained and she presented to discuss CT results.   12/15/2023 CT chest abdomen pelvis w contrast  1. Surgical changes from bilateral mastectomies and left axillary lymph node dissection. No findings suspicious for chest wall recurrence and no supraclavicular or axillary adenopathy. 2. Stable 4 mm left lower lobe pulmonary nodule, considered benign. 3. No findings for abdominal/pelvic metastatic disease. 4. Surgical changes from a partial right hemicolectomy. No findings for recurrent colon cancer. 5. Aortic atherosclerosis.   Past Medical History:  Diagnosis Date   Anemia    Arthritis    Breast cancer, left breast (HCC) 2002   Left Mastectomy, chemo + Rad tx's.   Colon cancer (HCC) 10/2012   Partial colon resection and chemo tx's.   Diverticulitis    Glaucoma    Hearing loss    Hyperlipidemia 20 years   Kidney stone    Lymphedema of left arm    Neuromuscular disorder (HCC)    neuropathy  feet and hands from chemo tx   Pneumonia    Shingles    Vertigo    last episode around Jan 2018    Past Surgical History:  Procedure Laterality Date   ABDOMINAL HYSTERECTOMY  1973   APPENDECTOMY  1969   CATARACT EXTRACTION W/PHACO Left 07/11/2015   Procedure: CATARACT EXTRACTION PHACO AND INTRAOCULAR LENS PLACEMENT (IOC) left eye;  Surgeon: Dene Etienne, MD;  Location: [facility];  Service: Ophthalmology;  Laterality: Left;   CATARACT EXTRACTION W/PHACO Right 08/27/2016   Procedure: CATARACT EXTRACTION PHACO AND INTRAOCULAR LENS PLACEMENT (IOC);  Surgeon: Etienne Dene, MD;  Location: [facility];  Service: Ophthalmology;  Laterality: Right;  IVA TOPICAL RIGHT   CESAREAN SECTION     COLON RESECTION     COLONOSCOPY  2004   COLONOSCOPY N/A 04/04/2016   Procedure: COLONOSCOPY;  Surgeon: Gladis RAYMOND Mariner, MD;  Location: [facility];  Service: Endoscopy;  Laterality: N/A;   COLONOSCOPY WITH PROPOFOL  N/A 10/01/2018    Procedure: COLONOSCOPY WITH PROPOFOL ;  Surgeon: Jinny Carmine, MD;  Location: [facility];  Service: Endoscopy;  Laterality: N/A;   COLONOSCOPY WITH PROPOFOL  N/A 09/27/2021   Procedure: COLONOSCOPY WITH PROPOFOL ;  Surgeon: Maryruth Ole DASEN, MD;  Location: [facility];  Service: Endoscopy;  Laterality: N/A;   ESOPHAGOGASTRODUODENOSCOPY N/A 04/04/2016   Procedure: ESOPHAGOGASTRODUODENOSCOPY (EGD);  Surgeon: Gladis RAYMOND Mariner, MD;  Location: [facility];  Service: Endoscopy;  Laterality: N/A;   kidney stone removal      MASTECTOMY Bilateral 2002,2010   salpingo oophorectmy   1973    Family History  Problem Relation Age of Onset   Hypertension Mother    Alzheimer's disease Mother    Cancer Father        prostate   Prostate cancer Father    Cancer Sister        breast   Hypertension Sister    Cancer Brother        stomach   Hypertension Brother    Sarcoidosis Other        positive family history.   Cancer Sister        breast   Cancer Sister        breast    Social History:  reports that she quit smoking about 23 years ago. Her smoking use included cigarettes. She started smoking about 58 years ago. She has a 52.5 pack-year smoking history. She has never used smokeless tobacco. She reports that she does not drink alcohol  and does not use drugs.  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  She quit smoking in 2003.  She lives in [place].  She belongs to the [employer].  The patient is alone today.  Allergies:  Allergies  Allergen Reactions   Hydrocodone-Acetaminophen  Other (See Comments) and Shortness Of Breath    Not witnessed info gathered as part of patient screening.  PT states her chest felt very heavy and difficulty breathing.   Sulfa Antibiotics Swelling   Cefuroxime  Diarrhea   Hydrocodone-Ibuprofen Other (See Comments)    Patient feels like there is a heavy wight on her chest   Levofloxacin Hives   Statins Other (See Comments)    Leg cramps and  groin pain   Other Itching    Tree Nuts   Tape Rash   Tree Extract Itching    Current Medications: Current Outpatient Medications  Medication Sig Dispense Refill   Calcium Carb-Cholecalciferol (CALCIUM 600 + D PO) Take 1 tablet by mouth daily.      cyanocobalamin  (VITAMIN B12) 1000 MCG tablet Take 1,000 mcg by mouth.     timolol  (TIMOPTIC ) 0.5 % ophthalmic solution Apply to eye.     No current facility-administered medications for this visit.    Review of Systems  Constitutional:  Negative for appetite change, chills, fatigue and fever.  HENT:   Negative for hearing loss and voice change.   Eyes:  Negative for eye problems.  Respiratory:  Negative for chest tightness and cough.   Cardiovascular:  Negative for chest pain.  Gastrointestinal:  Negative for abdominal distention, abdominal pain and blood in stool.  Endocrine: Negative for hot flashes.  Genitourinary:  Negative for difficulty urinating and frequency.   Musculoskeletal:  Negative for arthralgias.  Skin:  Negative for itching and rash.  Neurological:  Positive for numbness. Negative for extremity weakness.  Hematological:  Negative for adenopathy.  Psychiatric/Behavioral:  Negative for confusion.       Physical Exam:  Blood pressure (!) 112/59, pulse 83, temperature 98.6 F (37 C), resp. rate 20, weight 129 lb 12.8 oz (58.9 kg), SpO2 100%. Physical Exam Constitutional:      General: She is not in acute distress.    Comments: Thin built, she walks independently  HENT:     Head: Normocephalic and atraumatic.     Nose: Nose normal.     Mouth/Throat:     Pharynx: No oropharyngeal exudate.  Eyes:     General: No scleral icterus.    Pupils: Pupils are equal, round, and reactive to light.  Cardiovascular:     Rate and Rhythm: Normal rate and regular rhythm.     Heart sounds: No murmur heard. Pulmonary:     Effort: Pulmonary effort is normal. No respiratory distress.     Breath sounds: No rales.  Chest:      Chest wall: No tenderness.  Abdominal:     General: There is no distension.     Palpations: Abdomen is soft.     Tenderness: There is no abdominal tenderness.  Musculoskeletal:        General: Normal range of motion.     Cervical back: Normal range of motion and neck supple.     Comments: Left upper arm-lymphedema sleeve  Skin:    General: Skin is warm and dry.     Findings: No erythema.  Neurological:     Mental Status: She is alert and oriented to person, place, and time.  Psychiatric:        Mood and Affect: Affect normal.    Status post bilateral mastectomy, no chest wall abnormality was palpable.  No palpable axillary lymph node adenopathy bilaterally.  Chronic left axillary scarring tissue.  Labs reviewed    Latest Ref Rng & Units 11/23/2023    2:33 PM 12/15/2022    2:48 PM 08/01/2020    3:47 PM  CBC  WBC 4.0 - 10.5 K/uL 3.2  3.4  2.9   Hemoglobin 12.0 - 15.0 g/dL 86.9  87.2  86.7   Hematocrit 36.0 - 46.0 % 39.3  37.9  39.4   Platelets 150 - 400 K/uL 154  147  148       Latest Ref Rng & Units 11/23/2023    2:33 PM 12/15/2022    2:48 PM 01/03/2022   12:53 PM  CMP  Glucose 70 - 99 mg/dL 92  92    BUN 8 - 23 mg/dL 19  14    Creatinine 9.55 - 1.00 mg/dL 9.16  9.14  9.09   Sodium 135 - 145 mmol/L 135  137    Potassium 3.5 - 5.1 mmol/L 3.9  3.9    Chloride 98 - 111 mmol/L 98  101    CO2 22 - 32 mmol/L 29  29    Calcium 8.9 - 10.3 mg/dL 9.1  8.8    Total Protein 6.5 - 8.1 g/dL 7.4     Total Bilirubin 0.0 - 1.2 mg/dL 0.8     Alkaline Phos 38 - 126 U/L 92     AST 15 - 41 U/L 25     ALT 0 -  44 U/L 15      RADIOGRAPHIC STUDIES: I have personally reviewed the radiological images as listed and agreed with the findings in the report. CT CHEST ABDOMEN PELVIS W CONTRAST Result Date: 12/24/2023 CLINICAL DATA:  Restaging colon cancer. Elevated CEA. * Tracking Code: BO * EXAM: CT CHEST, ABDOMEN, AND PELVIS WITH CONTRAST TECHNIQUE: Multidetector CT imaging of the chest, abdomen  and pelvis was performed following the standard protocol during bolus administration of intravenous contrast. RADIATION DOSE REDUCTION: This exam was performed according to the departmental dose-optimization program which includes automated exposure control, adjustment of the mA and/or kV according to patient size and/or use of iterative reconstruction technique. CONTRAST:  75mL OMNIPAQUE  IOHEXOL  300 MG/ML  SOLN COMPARISON:  Multiple prior imaging studies. The most recent CT scan is 01/03/2022 FINDINGS: CT CHEST FINDINGS Cardiovascular: The heart is normal in size. No pericardial effusion. Small left ventricular apical aneurysm likely related to prior infarct. The aorta is normal in caliber. No dissection. Minimal scattered atherosclerotic calcifications. The branch vessels are patent. Scattered coronary artery calcifications. Mediastinum/Nodes: No mediastinal or hilar mass or lymphadenopathy. The esophagus is. Lungs/Pleura: No acute pulmonary findings. Stable area dense left apical pleural and parenchymal scarring with scattered calcifications. 4 mm left lower lobe pulmonary nodule image number 74/4 is stable and considered benign. No worrisome pulmonary lesions or nodules to suggest metastatic pulmonary disease. The central tracheobronchial tree is clear. Musculoskeletal: Surgical changes from bilateral mastectomies. Left-sided axillary lymph node dissection. No findings suspicious for chest wall recurrence and no supraclavicular or axillary adenopathy. The bony thorax is intact. No worrisome bone lesions. CT ABDOMEN PELVIS FINDINGS Hepatobiliary: No hepatic lesions to suggest metastatic disease. No intrahepatic biliary dilatation. The gallbladder is mildly contracted. No common bile duct dilatation. Pancreas: No mass, inflammation ductal dilatation. Spleen: Normal size.  No focal lesions. Adrenals/Urinary Tract: The adrenal glands and kidneys are normal. No renal lesions hydronephrosis. The bladder is  unremarkable. Stomach/Bowel: The stomach, duodenum, small bowel and colon are unremarkable. Surgical changes from a partial right hemicolectomy. No colonic lesions or obstructive findings. Scattered colonic diverticulosis. Vascular/Lymphatic: Stable scattered atherosclerotic calcifications involving the aorta and branch vessels but no aneurysm or dissection. The major venous structures are patent. No mesenteric or retroperitoneal mass or adenopathy. Reproductive: The uterus is surgically absent. No adnexal mass. Stable left adnexal calcifications. Other: No pelvic mass or adenopathy. No free pelvic fluid collections. No inguinal mass or adenopathy. No abdominal wall hernia or subcutaneous lesions. Musculoskeletal: No worrisome lytic or sclerotic lesions. IMPRESSION: 1. Surgical changes from bilateral mastectomies and left axillary lymph node dissection. No findings suspicious for chest wall recurrence and no supraclavicular or axillary adenopathy. 2. Stable 4 mm left lower lobe pulmonary nodule, considered benign. 3. No findings for abdominal/pelvic metastatic disease. 4. Surgical changes from a partial right hemicolectomy. No findings for recurrent colon cancer. 5. Aortic atherosclerosis. Aortic Atherosclerosis (ICD10-I70.0). Electronically Signed   By: MYRTIS Stammer M.D.   On: 12/24/2023 10:55

## 2023-12-24 NOTE — Assessment & Plan Note (Signed)
#  History of left breast cancer status post mastectomy bilaterally, adjuvant chemotherapy Clinically she is doing well.  Previous breast pathology was not available.  She did not receive adjuvant endocrine therapy. Clinically she is doing well.

## 2023-12-24 NOTE — Assessment & Plan Note (Signed)
 History of ascending colon cancer, stage III,   Status post hemicolectomy -12/24/2012 and adjuvant chemotherapy (01/28/2013 - 04/29/2013). She has chronically elevated CEA.  Clinically doing well. Most recent CEA level is slightly increased comparing to her baseline. CT scan showed no evidence of recurrence. Reviewed with patient.  Recommend her to continue her colonoscopy screening at frequency recommended by her gastroenterologist.

## 2023-12-29 NOTE — Progress Notes (Signed)
 "   PATIENT PROFILE: MYLISSA LAMBE is a 73 y.o. female who presents to the [facility] for follow-up of alternating constipation and diarrhea. Pt was last seen in clinic 08/04/23 for chronic constipation with overflow incontinence and advised to do a bowel purge.  Brief History   Brief History:  - CSY: 07/08/2010 - normal examined colon - CSY: 12/02/2012 - ulcerated non-obstructing medium-sized mass found in proximal ascending colon with path confirming invasive adenocarcinoma  - CSY: 12/09/2013 - normal examined colon - CSY: 04/04/2016 - patent functional end-to-end ileo-colonic anastomosis characterized by healthy appearing mucosa, one 2 mm SSA removed from distal sigmoid colon - EGD: 04/04/2016 - LA Grade A erosive esophagitis, non-bleeding erosive gastropathy, H pylori gastritis, duodenitis  - EGD: 10/30/2016 - normal esophagus, stomach, and duodenum.Biopsies neg for H pylori.  - CSY: 10/01/2018 - patent end-to-side colocolonic anastomosis in transverse colon characterized by healthy-appearing mucosa. Grade 1 internal hemorrhoids. - CSY: 09/27/2021 - evidence of prior end-to-side ileo-colonic anastomosis in ascending colon characterized by healthy appearing mucosa, otherwise normal examined colon. No specimens collected.     GI medications: -- Current: None. -- Prior: Omeprazole 40 mg BID. H pylori eradication.    Interval History   Ms. Wass presents to the [facility] for follow-up of chronic constipation with overflow incontinence. She had abdominal x-ray performed at time of her last visit which commented on non-obstructive bowel gas pattern with moderate left-sided stool burden. She completed a bowel purge and then started a daily bowel regimen after. She feels like the bowel purge was successful. She is no longer taking Miralax on a daily basis. She is using prune juice 3 times a week currently. She estimates her bowels are moving daily to every other day currently. She  estimates 4-5 bowel movements a week. She feels like her bowels make a lot of noise. She frequently gets abdominal bloating and gas associated with her sour stomach. She has had no further weight loss. Her appetite is stable. No unintentional weight loss. She continues to follow-up with Dr. Babara in Oncology. Recent CT chest/abd/pelvis was reassuring with no colonic mass lesions or lymphadenopathy. No other complaints or concerns at this time.    GENERAL REVIEW OF SYSTEMS: General: negative for - fever, chills, weight gain, + weight loss, + fatigue Head: no injury, migraines, headaches Eyes: no jaundice, itching, dryness, tearing, redness, vision changes Nose: no injury, bleeding Mouth/Throat: no oral ulcers, swollen neck, dry mouth, sore throat, hoarseness Endocrine: no heat/cold intolerance Respiratory: no cough, wheezing, SOB Cardiovascular: no chest pain, palpitations GI: see HPI Musculoskeletal: no joint swelling, muscle/joint pain  Neurological: no seizures, syncope, dizziness, numbness/tingling Skin: no rashes, itching Hematological and Lymphatic: easy bruising, easy bleeding  MEDICATIONS: Outpatient Encounter Medications as of 12/29/2023  Medication Sig Dispense Refill   calcium carbonate-vitamin D3 (OS-CAL 500+D) 500 mg(1,250mg ) -400 unit tablet Take 1 tablet by mouth once daily.     cyanocobalamin  (VITAMIN B12) 1000 MCG tablet Take 1,000 mcg by mouth once daily     timoloL  maleate (TIMOPTIC ) 0.5 % ophthalmic solution Place 1 drop into both eyes every morning     No facility-administered encounter medications on file as of 12/29/2023.    ALLERGIES: Norco [hydrocodone-acetaminophen ], Statins-hmg-coa reductase inhibitors, Sulfa (sulfonamide antibiotics), Cefuroxime , Hydrocodone-ibuprofen, Latex, Levaquin [levofloxacin], Adhesive, Adhesive tape-silicones, Other, and Tree nut  PAST MEDICAL HISTORY: Past Medical History:  Diagnosis Date   Allergic state    Anemia 1965    Arthritis    Diverticulitis  DJD (degenerative joint disease), cervical    Duodenitis 04/04/2016   Gastritis 04/04/2016   Glaucoma (increased eye pressure)    H. pylori infection 04/04/2016   Hearing loss    History of breast cancer 2002   left breast   History of cancer 10/2012   colon   History of chicken pox    Hyperlipidemia    Kidney stones 1969   Neuropathy    Serrated adenoma of colon 04/04/2016    PAST SURGICAL HISTORY: Past Surgical History:  Procedure Laterality Date   APPENDECTOMY  1969   CESAREAN SECTION  1969   HYSTERECTOMY  1973   Removal of right ovary   OOPHORECTOMY  1973   MASTECTOMY Left 2002   s/p chemo and radiation therapy   MASTECTOMY SIMPLE Right 2010   COLONOSCOPY  12/09/2013   FHx Colon Cancer 1st degree relative/ Repeat 3 Years/ PYO   CATARACT EXTRACTION  07/11/2015   COLONOSCOPY  04/04/2016   Serrated adenoma of colon/Repeat 18 to 24 months/MUS   EGD  04/04/2016   H. pylori infection/Gastritis/Duodenitis/No Repeat/MUS   CATARACT EXTRACTION  08/27/2016   COLONOSCOPY  10/01/2018   CHEST WALL TUMOR EXCISION Right 08/23/2020   Colon @ [facility]  09/27/2021   Normal examined colon/PHx CP/Repeat 46yrs/CTL   COLONOSCOPY  07/08/02; 07/08/10; 12/02/12   Invasive adenocarcinoma   EGD  07/06/03; 07/12/07; 12/02/12   No repeat, normal.   INCISION TENDON SHEATH FOR TRIGGER FINGER Left    LAPAROSCOPIC COLECTOMY PARTIAL W/ANASTAMOSIS Right    carcinoma of ascending colon, 1 of 27 lymph nodes.   PORTOCAVAL SHUNT PLACEMENT       FAMILY HISTORY: Family History  Problem Relation Name Age of Onset   Alzheimer's disease Mother     Kidney disease Mother     Dementia Mother     Prostate cancer Father     Cancer Father     Breast cancer Sister     Cancer Sister     High blood pressure (Hypertension) Sister     Breast cancer Sister         82s   Breast cancer Sister         30s   Pancreatic cancer Sister      Colon cancer Brother     High blood pressure (Hypertension) Brother     Stomach cancer Brother     Liver cancer Paternal Aunt     Colon cancer Paternal Grandfather     Stomach cancer Paternal Grandfather       SOCIAL HISTORY: Social History   Socioeconomic History   Marital status: Single  Tobacco Use   Smoking status: Former    Current packs/day: 1.50    Average packs/day: 1.5 packs/day for 35.0 years (52.5 ttl pk-yrs)    Types: Cigarettes   Smokeless tobacco: Former    Quit date: 03/24/2000  Vaping Use   Vaping status: Never Used  Substance and Sexual Activity   Alcohol  use: Never   Drug use: No   Sexual activity: Not Currently   Social Drivers of Health   Financial Resource Strain: Low Risk  (07/15/2023)   Overall Financial Resource Strain (CARDIA)    Difficulty of Paying Living Expenses: Not hard at all  Food Insecurity: No Food Insecurity (07/15/2023)   Hunger Vital Sign    Worried About Running Out of Food in the Last Year: Never true    Ran Out of Food in the Last Year: Never true  Transportation Needs: No Transportation  Needs (07/15/2023)   PRAPARE - Administrator, Civil Service (Medical): No    Lack of Transportation (Non-Medical): No    Vitals:   12/29/23 1137  BP: 107/63  Pulse: 96  Weight: 59.5 kg (131 lb 3.2 oz)  Height: 157.5 cm (5' 2)     PHYSICAL EXAM: Non-toxic appearing female in no acute distress. Answers all questions appropriately.  General: NAD, alert and oriented x 4 HEENT: PEERLA, EOMI, anciteric  Neck: supple, no JVD or thyromegaly. No lymphadenopathy.  Respiratory: CTA bilaterally, no wheezes, crackles, or other adventitious sounds Cardiac: RRR, no murmur, rub, or gallop  GI: soft, normal bowel sounds, no TTP, no HSM, no rebound or guarding Rectal: external exam without any thrombosed external hemorrhoids or palpable lesions, internal exam with no palpable lesions, but was large amount of dried stool in  rectal vault, no gross blood on finger, brown stool guaiac negative. Normal sphincter tone.  MSK: no edema, well perfused with 2+ pulses, Skin: Skin color, texture, turgor normal, no rashes or lesions Lymph: no LAD Neuro: Grossly intact   REVIEW OF DATA: I have reviewed the following data today:  Previous OV notes with PCP reviewed - see HPI  Previous GI procedure/path notes reviewed - see Brief History above  CT chest/abd/pelvis with contrast 12/15/2023: IMPRESSION:  1. Surgical changes from bilateral mastectomies and left axillary  lymph node dissection. No findings suspicious for chest wall  recurrence and no supraclavicular or axillary adenopathy.  2. Stable 4 mm left lower lobe pulmonary nodule, considered benign.  3. No findings for abdominal/pelvic metastatic disease.  4. Surgical changes from a partial right hemicolectomy. No findings  for recurrent colon cancer.  5. Aortic atherosclerosis.    ASSESSMENT AND PLAN: Ms. Ovitt is a 73 y.o. female presenting for follow up: Diagnoses and all orders for this visit:  Chronic constipation with overflow incontinence  Borborygmi  Abdominal bloating  Dyspepsia  Personal history of colon cancer    73 y/o AA female with a PMH of mixed HLD, pancytopenia, statin myopathy, OA, peripheral polyneuropathy, hx of breast cancer, hx of colon cancer dx 2014, and DJD presents to the [facility] for follow-up  Chronic constipation with overflow   Borborygmi  Abdominal bloating  Dyspepsia  Personal history of colon cancer diagnosed 2014  - Clinical presentation c/w chronic constipation with overflow - We reviewed importance of daily bowel regiment to prevent constipation. She was advised to continue regular prune juice and use Miralax PRN.  - Recent colonoscopy 09/2021 was reassuring with no polyps or masses. Reviewed this report with her. Defer repeat colonoscopy at this time - Continue bowel regimen daily. High-fiber diet  advised indefinitely.  - Advised her to try Intestinal Comfort or IBGard OTC - Continue efforts at good water  and fiber intake daily  1-year follow-up in office, sooner if needed  Attestation Statement:   I personally performed the service, non-incident to. [facility])   TWYLA JONETTE PRIMMER, PA     Jonette Primmer, PA-C [facility], Gastroenterology [address] [place], [place] [address] [phone] "
